# Patient Record
Sex: Male | Born: 1945 | Race: Black or African American | Hispanic: No | Marital: Married | Smoking: Current every day smoker
Health system: Southern US, Community
[De-identification: ages and names within clinical notes are randomized; demographics above are authoritative.]

## PROBLEM LIST (undated history)

## (undated) DIAGNOSIS — I10 Essential (primary) hypertension: Secondary | ICD-10-CM

## (undated) DIAGNOSIS — F4310 Post-traumatic stress disorder, unspecified: Secondary | ICD-10-CM

## (undated) DIAGNOSIS — J449 Chronic obstructive pulmonary disease, unspecified: Secondary | ICD-10-CM

## (undated) DIAGNOSIS — IMO0001 Reserved for inherently not codable concepts without codable children: Secondary | ICD-10-CM

## (undated) DIAGNOSIS — E785 Hyperlipidemia, unspecified: Secondary | ICD-10-CM

## (undated) DIAGNOSIS — G43909 Migraine, unspecified, not intractable, without status migrainosus: Secondary | ICD-10-CM

## (undated) DIAGNOSIS — G4733 Obstructive sleep apnea (adult) (pediatric): Secondary | ICD-10-CM

## (undated) DIAGNOSIS — E119 Type 2 diabetes mellitus without complications: Secondary | ICD-10-CM

## (undated) DIAGNOSIS — Z9989 Dependence on other enabling machines and devices: Secondary | ICD-10-CM

## (undated) DIAGNOSIS — J189 Pneumonia, unspecified organism: Secondary | ICD-10-CM

## (undated) HISTORY — PX: TOTAL KNEE ARTHROPLASTY: SHX125

## (undated) HISTORY — PX: JOINT REPLACEMENT: SHX530

## (undated) HISTORY — PX: EXCISIONAL HEMORRHOIDECTOMY: SHX1541

---

## 2006-08-12 ENCOUNTER — Ambulatory Visit: Payer: Self-pay | Admitting: Unknown Physician Specialty

## 2007-06-20 ENCOUNTER — Encounter: Payer: Self-pay | Admitting: Orthopedic Surgery

## 2007-06-26 ENCOUNTER — Encounter: Payer: Self-pay | Admitting: Orthopedic Surgery

## 2007-07-24 ENCOUNTER — Encounter: Payer: Self-pay | Admitting: Orthopedic Surgery

## 2007-08-24 ENCOUNTER — Encounter: Payer: Self-pay | Admitting: Orthopedic Surgery

## 2013-10-17 ENCOUNTER — Inpatient Hospital Stay (HOSPITAL_COMMUNITY)
Admission: EM | Admit: 2013-10-17 | Discharge: 2013-10-20 | DRG: 190 | Disposition: A | Discharge status: Home / Self Care | Payer: Medicare Other | Attending: Internal Medicine | Admitting: Internal Medicine

## 2013-10-17 ENCOUNTER — Emergency Department (HOSPITAL_COMMUNITY): Payer: Medicare Other

## 2013-10-17 ENCOUNTER — Encounter (HOSPITAL_COMMUNITY): Payer: Self-pay | Admitting: Emergency Medicine

## 2013-10-17 DIAGNOSIS — J441 Chronic obstructive pulmonary disease with (acute) exacerbation: Principal | ICD-10-CM | POA: Diagnosis present

## 2013-10-17 DIAGNOSIS — T380X5A Adverse effect of glucocorticoids and synthetic analogues, initial encounter: Secondary | ICD-10-CM | POA: Diagnosis present

## 2013-10-17 DIAGNOSIS — J189 Pneumonia, unspecified organism: Secondary | ICD-10-CM | POA: Diagnosis present

## 2013-10-17 DIAGNOSIS — E871 Hypo-osmolality and hyponatremia: Secondary | ICD-10-CM | POA: Diagnosis present

## 2013-10-17 DIAGNOSIS — D72829 Elevated white blood cell count, unspecified: Secondary | ICD-10-CM | POA: Diagnosis present

## 2013-10-17 DIAGNOSIS — F411 Generalized anxiety disorder: Secondary | ICD-10-CM | POA: Diagnosis present

## 2013-10-17 DIAGNOSIS — E119 Type 2 diabetes mellitus without complications: Secondary | ICD-10-CM

## 2013-10-17 DIAGNOSIS — E785 Hyperlipidemia, unspecified: Secondary | ICD-10-CM

## 2013-10-17 DIAGNOSIS — Z7982 Long term (current) use of aspirin: Secondary | ICD-10-CM

## 2013-10-17 DIAGNOSIS — I10 Essential (primary) hypertension: Secondary | ICD-10-CM

## 2013-10-17 DIAGNOSIS — F17200 Nicotine dependence, unspecified, uncomplicated: Secondary | ICD-10-CM | POA: Diagnosis present

## 2013-10-17 DIAGNOSIS — Z720 Tobacco use: Secondary | ICD-10-CM

## 2013-10-17 DIAGNOSIS — F4310 Post-traumatic stress disorder, unspecified: Secondary | ICD-10-CM | POA: Diagnosis present

## 2013-10-17 HISTORY — DX: Hyperlipidemia, unspecified: E78.5

## 2013-10-17 HISTORY — DX: Post-traumatic stress disorder, unspecified: F43.10

## 2013-10-17 HISTORY — DX: Pneumonia, unspecified organism: J18.9

## 2013-10-17 HISTORY — DX: Essential (primary) hypertension: I10

## 2013-10-17 HISTORY — DX: Migraine, unspecified, not intractable, without status migrainosus: G43.909

## 2013-10-17 HISTORY — DX: Dependence on other enabling machines and devices: Z99.89

## 2013-10-17 HISTORY — DX: Type 2 diabetes mellitus without complications: E11.9

## 2013-10-17 HISTORY — DX: Obstructive sleep apnea (adult) (pediatric): G47.33

## 2013-10-17 LAB — CBC WITH DIFFERENTIAL/PLATELET
BASOS ABS: 0 10*3/uL (ref 0.0–0.1)
Basophils Relative: 0 % (ref 0–1)
Eosinophils Absolute: 0.1 10*3/uL (ref 0.0–0.7)
Eosinophils Relative: 1 % (ref 0–5)
HCT: 36.4 % — ABNORMAL LOW (ref 39.0–52.0)
Hemoglobin: 13.1 g/dL (ref 13.0–17.0)
Lymphocytes Relative: 33 % (ref 12–46)
Lymphs Abs: 4.7 10*3/uL — ABNORMAL HIGH (ref 0.7–4.0)
MCH: 29.8 pg (ref 26.0–34.0)
MCHC: 36 g/dL (ref 30.0–36.0)
MCV: 82.9 fL (ref 78.0–100.0)
MONO ABS: 1.1 10*3/uL — AB (ref 0.1–1.0)
MONOS PCT: 8 % (ref 3–12)
NEUTROS PCT: 58 % (ref 43–77)
Neutro Abs: 8.4 10*3/uL — ABNORMAL HIGH (ref 1.7–7.7)
PLATELETS: 350 10*3/uL (ref 150–400)
RBC: 4.39 MIL/uL (ref 4.22–5.81)
RDW: 14.8 % (ref 11.5–15.5)
WBC: 14.3 10*3/uL — AB (ref 4.0–10.5)

## 2013-10-17 LAB — CBC
HCT: 34 % — ABNORMAL LOW (ref 39.0–52.0)
Hemoglobin: 12.7 g/dL — ABNORMAL LOW (ref 13.0–17.0)
MCH: 30.9 pg (ref 26.0–34.0)
MCHC: 37.4 g/dL — AB (ref 30.0–36.0)
MCV: 82.7 fL (ref 78.0–100.0)
Platelets: 333 10*3/uL (ref 150–400)
RBC: 4.11 MIL/uL — ABNORMAL LOW (ref 4.22–5.81)
RDW: 15.1 % (ref 11.5–15.5)
WBC: 12.2 10*3/uL — ABNORMAL HIGH (ref 4.0–10.5)

## 2013-10-17 LAB — I-STAT ARTERIAL BLOOD GAS, ED
Acid-Base Excess: 2 mmol/L (ref 0.0–2.0)
BICARBONATE: 26.2 meq/L — AB (ref 20.0–24.0)
O2 Saturation: 91 %
PCO2 ART: 36.6 mmHg (ref 35.0–45.0)
TCO2: 27 mmol/L (ref 0–100)
pH, Arterial: 7.463 — ABNORMAL HIGH (ref 7.350–7.450)
pO2, Arterial: 56 mmHg — ABNORMAL LOW (ref 80.0–100.0)

## 2013-10-17 LAB — URINALYSIS, ROUTINE W REFLEX MICROSCOPIC
Glucose, UA: >1000 mg/dL — AB
Hgb urine dipstick: NEGATIVE
Ketones, ur: NEGATIVE mg/dL
LEUKOCYTES UA: NEGATIVE
NITRITE: NEGATIVE
PH: 6 (ref 5.0–8.0)
Protein, ur: 30 mg/dL — AB
Specific Gravity, Urine: 1.038 — ABNORMAL HIGH (ref 1.005–1.030)
Urobilinogen, UA: 1 mg/dL (ref 0.0–1.0)

## 2013-10-17 LAB — PRO B NATRIURETIC PEPTIDE: PRO B NATRI PEPTIDE: 53.6 pg/mL (ref 0–125)

## 2013-10-17 LAB — GLUCOSE, CAPILLARY
Glucose-Capillary: 394 mg/dL — ABNORMAL HIGH (ref 70–99)
Glucose-Capillary: 392 mg/dL — ABNORMAL HIGH (ref 70–99)

## 2013-10-17 LAB — CREATININE, SERUM
CREATININE: 1.17 mg/dL (ref 0.50–1.35)
GFR calc Af Amer: 72 mL/min — ABNORMAL LOW (ref >90)
GFR, EST NON AFRICAN AMERICAN: 62 mL/min — AB (ref >90)

## 2013-10-17 LAB — I-STAT TROPONIN, ED: TROPONIN I, POC: 0.01 ng/mL (ref 0.00–0.08)

## 2013-10-17 LAB — URINE MICROSCOPIC-ADD ON

## 2013-10-17 LAB — BASIC METABOLIC PANEL
BUN: 30 mg/dL — ABNORMAL HIGH (ref 6–23)
CHLORIDE: 93 meq/L — AB (ref 96–112)
CO2: 24 mEq/L (ref 19–32)
Calcium: 10.6 mg/dL — ABNORMAL HIGH (ref 8.4–10.5)
Creatinine, Ser: 1.21 mg/dL (ref 0.50–1.35)
GFR calc non Af Amer: 60 mL/min — ABNORMAL LOW (ref >90)
GFR, EST AFRICAN AMERICAN: 69 mL/min — AB (ref >90)
Glucose, Bld: 339 mg/dL — ABNORMAL HIGH (ref 70–99)
POTASSIUM: 4 meq/L (ref 3.7–5.3)
Sodium: 134 mEq/L — ABNORMAL LOW (ref 137–147)

## 2013-10-17 LAB — STREP PNEUMONIAE URINARY ANTIGEN: STREP PNEUMO URINARY ANTIGEN: NEGATIVE

## 2013-10-17 MED ORDER — SENNOSIDES-DOCUSATE SODIUM 8.6-50 MG PO TABS
2.0000 | ORAL_TABLET | Freq: Every day | ORAL | Status: DC
  Administered 2013-10-19: 2 via ORAL
  Filled 2013-10-17: qty 2

## 2013-10-17 MED ORDER — GABAPENTIN 300 MG PO CAPS
300.0000 mg | ORAL_CAPSULE | ORAL | Status: DC
  Administered 2013-10-18 – 2013-10-20 (×5): 300 mg via ORAL
  Filled 2013-10-17 (×9): qty 1

## 2013-10-17 MED ORDER — IPRATROPIUM-ALBUTEROL 0.5-2.5 (3) MG/3ML IN SOLN
3.0000 mL | Freq: Four times a day (QID) | RESPIRATORY_TRACT | Status: DC
  Administered 2013-10-18 – 2013-10-19 (×5): 3 mL via RESPIRATORY_TRACT
  Filled 2013-10-17 (×5): qty 3

## 2013-10-17 MED ORDER — NICOTINE 21 MG/24HR TD PT24
21.0000 mg | MEDICATED_PATCH | Freq: Every day | TRANSDERMAL | Status: DC
  Administered 2013-10-17 – 2013-10-20 (×4): 21 mg via TRANSDERMAL
  Filled 2013-10-17 (×4): qty 1

## 2013-10-17 MED ORDER — TEMAZEPAM 15 MG PO CAPS: 30.0000 mg | ORAL_CAPSULE | Freq: Every evening | ORAL | Status: DC | PRN

## 2013-10-17 MED ORDER — FLUNISOLIDE 25 MCG/ACT (0.025%) NA SOLN
2.0000 | Freq: Two times a day (BID) | NASAL | Status: DC | PRN
  Filled 2013-10-17: qty 25

## 2013-10-17 MED ORDER — AMLODIPINE BESYLATE 2.5 MG PO TABS
2.5000 mg | ORAL_TABLET | Freq: Every day | ORAL | Status: DC
  Administered 2013-10-17 – 2013-10-18 (×2): 2.5 mg via ORAL
  Filled 2013-10-17 (×2): qty 1

## 2013-10-17 MED ORDER — INSULIN ASPART 100 UNIT/ML ~~LOC~~ SOLN
0.0000 [IU] | Freq: Three times a day (TID) | SUBCUTANEOUS | Status: DC
  Administered 2013-10-17: 15 [IU] via SUBCUTANEOUS

## 2013-10-17 MED ORDER — ALBUTEROL SULFATE (2.5 MG/3ML) 0.083% IN NEBU
5.0000 mg | INHALATION_SOLUTION | Freq: Once | RESPIRATORY_TRACT | Status: AC
  Administered 2013-10-17: 5 mg via RESPIRATORY_TRACT
  Filled 2013-10-17: qty 6

## 2013-10-17 MED ORDER — GABAPENTIN 300 MG PO CAPS: 300.0000 mg | ORAL_CAPSULE | Freq: Three times a day (TID) | ORAL | Status: DC

## 2013-10-17 MED ORDER — METHYLPREDNISOLONE SODIUM SUCC 125 MG IJ SOLR
60.0000 mg | Freq: Two times a day (BID) | INTRAMUSCULAR | Status: DC
  Administered 2013-10-17 – 2013-10-18 (×2): 60 mg via INTRAVENOUS
  Filled 2013-10-17 (×4): qty 0.96

## 2013-10-17 MED ORDER — PANTOPRAZOLE SODIUM 40 MG PO TBEC
40.0000 mg | DELAYED_RELEASE_TABLET | Freq: Two times a day (BID) | ORAL | Status: DC
  Administered 2013-10-17 – 2013-10-20 (×6): 40 mg via ORAL
  Filled 2013-10-17 (×8): qty 1

## 2013-10-17 MED ORDER — DEXTROSE 5 % IV SOLN
500.0000 mg | Freq: Once | INTRAVENOUS | Status: DC
  Administered 2013-10-17: 500 mg via INTRAVENOUS

## 2013-10-17 MED ORDER — INSULIN GLARGINE 100 UNIT/ML ~~LOC~~ SOLN
18.0000 [IU] | Freq: Every day | SUBCUTANEOUS | Status: DC
  Administered 2013-10-17: 18 [IU] via SUBCUTANEOUS
  Filled 2013-10-17 (×2): qty 0.18

## 2013-10-17 MED ORDER — HYDROCODONE-ACETAMINOPHEN 10-325 MG PO TABS
1.0000 | ORAL_TABLET | Freq: Four times a day (QID) | ORAL | Status: DC | PRN
  Administered 2013-10-18: 1 via ORAL
  Filled 2013-10-17: qty 1

## 2013-10-17 MED ORDER — POTASSIUM CHLORIDE CRYS ER 20 MEQ PO TBCR
20.0000 meq | EXTENDED_RELEASE_TABLET | Freq: Every day | ORAL | Status: DC
  Administered 2013-10-18: 20 meq via ORAL
  Filled 2013-10-17: qty 1

## 2013-10-17 MED ORDER — VITAMIN D3 25 MCG (1000 UNIT) PO TABS
1000.0000 [IU] | ORAL_TABLET | Freq: Every day | ORAL | Status: DC
  Administered 2013-10-18 – 2013-10-20 (×3): 1000 [IU] via ORAL
  Filled 2013-10-17 (×3): qty 1

## 2013-10-17 MED ORDER — BUDESONIDE 0.25 MG/2ML IN SUSP
0.2500 mg | Freq: Two times a day (BID) | RESPIRATORY_TRACT | Status: DC
  Administered 2013-10-17 – 2013-10-20 (×6): 0.25 mg via RESPIRATORY_TRACT
  Filled 2013-10-17 (×8): qty 2

## 2013-10-17 MED ORDER — ASPIRIN EC 81 MG PO TBEC
81.0000 mg | DELAYED_RELEASE_TABLET | Freq: Every day | ORAL | Status: DC
  Administered 2013-10-18 – 2013-10-20 (×3): 81 mg via ORAL
  Filled 2013-10-17 (×3): qty 1

## 2013-10-17 MED ORDER — SODIUM CHLORIDE 0.9 % IV SOLN
INTRAVENOUS | Status: DC
  Administered 2013-10-17 – 2013-10-18 (×2): via INTRAVENOUS

## 2013-10-17 MED ORDER — CHLORTHALIDONE 25 MG PO TABS
12.5000 mg | ORAL_TABLET | Freq: Every day | ORAL | Status: DC
  Administered 2013-10-18 – 2013-10-20 (×3): 12.5 mg via ORAL
  Filled 2013-10-17 (×3): qty 0.5

## 2013-10-17 MED ORDER — VANCOMYCIN HCL IN DEXTROSE 750-5 MG/150ML-% IV SOLN
750.0000 mg | Freq: Two times a day (BID) | INTRAVENOUS | Status: DC
  Administered 2013-10-17 – 2013-10-20 (×6): 750 mg via INTRAVENOUS
  Filled 2013-10-17 (×7): qty 150

## 2013-10-17 MED ORDER — GABAPENTIN 300 MG PO CAPS
900.0000 mg | ORAL_CAPSULE | Freq: Every day | ORAL | Status: DC
  Administered 2013-10-17 – 2013-10-19 (×3): 900 mg via ORAL
  Filled 2013-10-17 (×4): qty 3

## 2013-10-17 MED ORDER — ENOXAPARIN SODIUM 40 MG/0.4ML ~~LOC~~ SOLN
40.0000 mg | SUBCUTANEOUS | Status: DC
  Administered 2013-10-17 – 2013-10-19 (×3): 40 mg via SUBCUTANEOUS
  Filled 2013-10-17 (×4): qty 0.4

## 2013-10-17 MED ORDER — TERAZOSIN HCL 5 MG PO CAPS
10.0000 mg | ORAL_CAPSULE | Freq: Every day | ORAL | Status: DC
  Administered 2013-10-17 – 2013-10-19 (×3): 10 mg via ORAL
  Filled 2013-10-17 (×4): qty 2

## 2013-10-17 MED ORDER — ALBUTEROL SULFATE (2.5 MG/3ML) 0.083% IN NEBU: 2.5000 mg | INHALATION_SOLUTION | RESPIRATORY_TRACT | Status: DC | PRN

## 2013-10-17 MED ORDER — PAROXETINE HCL 20 MG PO TABS
40.0000 mg | ORAL_TABLET | Freq: Every day | ORAL | Status: DC
  Administered 2013-10-18 – 2013-10-20 (×3): 40 mg via ORAL
  Filled 2013-10-17 (×3): qty 2

## 2013-10-17 MED ORDER — DEXTROSE 5 % IV SOLN
1.0000 g | Freq: Once | INTRAVENOUS | Status: AC
  Administered 2013-10-17: 1 g via INTRAVENOUS
  Filled 2013-10-17: qty 10

## 2013-10-17 MED ORDER — INSULIN ASPART 100 UNIT/ML ~~LOC~~ SOLN
0.0000 [IU] | Freq: Every day | SUBCUTANEOUS | Status: DC
  Administered 2013-10-17: 5 [IU] via SUBCUTANEOUS

## 2013-10-17 MED ORDER — DEXTROSE 5 % IV SOLN
1.0000 g | Freq: Three times a day (TID) | INTRAVENOUS | Status: DC
  Administered 2013-10-17 – 2013-10-20 (×9): 1 g via INTRAVENOUS
  Filled 2013-10-17 (×10): qty 1

## 2013-10-17 MED ORDER — PNEUMOCOCCAL VAC POLYVALENT 25 MCG/0.5ML IJ INJ
0.5000 mL | INJECTION | INTRAMUSCULAR | Status: AC
  Administered 2013-10-18: 0.5 mL via INTRAMUSCULAR
  Filled 2013-10-17: qty 0.5

## 2013-10-17 MED ORDER — ATORVASTATIN CALCIUM 80 MG PO TABS
80.0000 mg | ORAL_TABLET | Freq: Every day | ORAL | Status: DC
  Administered 2013-10-17 – 2013-10-19 (×3): 80 mg via ORAL
  Filled 2013-10-17 (×4): qty 1

## 2013-10-17 MED ORDER — IPRATROPIUM-ALBUTEROL 0.5-2.5 (3) MG/3ML IN SOLN
3.0000 mL | RESPIRATORY_TRACT | Status: DC
  Administered 2013-10-17: 3 mL via RESPIRATORY_TRACT
  Filled 2013-10-17: qty 3

## 2013-10-17 NOTE — ED Provider Notes (Signed)
CSN: [id number]     Arrival date & time 10/17/13  1244 History   First MD Initiated Contact with Patient 10/17/13 1328     Chief Complaint  Patient presents with  . Shortness of Breath    The [facility] and was treated, given meds and released. The paitent said he is not getting better so he decided to come here.     (Consider location/radiation/quality/duration/timing/severity/associated sxs/prior Treatment) HPI Comments: This 68 year old male past medical history significant for DM, hypertension, hyperlipidemia, PTSD, tobacco abuse presented to the emergency department from home for worsening productive cough, fever and chills, shortness of breath since Saturday. Patient was [facility] on Thursday for 2 weeks of productive cough and fevers and was diagnosed with bronchopneumonia discharged home with albuterol inhaler, steroid and antibiotic. Patient seemed to be improving until Saturday when he developed worsening symptoms despite taking antibiotics and other medications prescribed at discharge as prescribed. No history of any hospitalizations or intubations for respiratory problems in past.  Patient is a 68 y.o. male presenting with shortness of breath.  Shortness of Breath Associated symptoms: cough and fever   Associated symptoms: no abdominal pain, no headaches and no vomiting     Past Medical History  Diagnosis Date  . PTSD (post-traumatic stress disorder)   . Diabetes mellitus without complication   . Hypertension   . Hyperlipidemia    Past Surgical History  Procedure Laterality Date  . Knee surgery     History reviewed. No pertinent family history. History  Substance Use Topics  . Smoking status: Current Every Day Smoker -- 1.00 packs/day    Types: Cigarettes  . Smokeless tobacco: Never Used  . Alcohol Use: No    Review of Systems  Constitutional: Positive for fever, chills, appetite change and fatigue.  Respiratory: Positive  for cough and shortness of breath.   Gastrointestinal: Positive for nausea. Negative for vomiting, abdominal pain, diarrhea and constipation.  Neurological: Negative for headaches.  All other systems reviewed and are negative.     Allergies  Review of patient's allergies indicates no known allergies.  Home Medications   Prior to Admission medications   Not on File   BP 130/85  Pulse 120  Temp(Src) 97.9 F (36.6 C) (Oral)  Resp 22  SpO2 99% Physical Exam  Nursing note and vitals reviewed. Constitutional: He is oriented to person, place, and time. He appears well-developed and well-nourished. No distress.  HENT:  Head: Normocephalic and atraumatic.  Right Ear: External ear normal.  Left Ear: External ear normal.  Nose: Nose normal.  Eyes: Conjunctivae are normal.  Neck: Normal range of motion. Neck supple.  Cardiovascular: Regular rhythm, normal heart sounds and intact distal pulses.  Tachycardia present.   Pulmonary/Chest: Accessory muscle usage present. No stridor. Not tachypneic and not bradypneic. No respiratory distress. He has decreased breath sounds in the right middle field, the right lower field and the left lower field. He has wheezes in the right upper field and the left upper field. He has rhonchi in the left middle field and the left lower field. He exhibits no tenderness.  Abdominal: Soft. Bowel sounds are normal. There is no tenderness.  Musculoskeletal: He exhibits no edema.  Moves all extremities without ataxia  Neurological: He is alert and oriented to person, place, and time.  Skin: Skin is warm and dry. He is not diaphoretic.    ED Course  Procedures (including critical care time) Medications  azithromycin (  ZITHROMAX) 500 mg in dextrose 5 % 250 mL IVPB (not administered)  albuterol (PROVENTIL) (2.5 MG/3ML) 0.083% nebulizer solution 5 mg (5 mg Nebulization Given 10/17/13 1401)  cefTRIAXone (ROCEPHIN) 1 g in dextrose 5 % 50 mL IVPB (1 g Intravenous New  Bag/Given 10/17/13 1436)    Labs Review Labs Reviewed  BASIC METABOLIC PANEL - Abnormal; Notable for the following:    Sodium 134 (*)    Chloride 93 (*)    Glucose, Bld 339 (*)    BUN 30 (*)    Calcium 10.6 (*)    GFR calc non Af Amer 60 (*)    GFR calc Af Amer 69 (*)    All other components within normal limits  CBC WITH DIFFERENTIAL - Abnormal; Notable for the following:    WBC 14.3 (*)    HCT 36.4 (*)    Neutro Abs 8.4 (*)    Lymphs Abs 4.7 (*)    Monocytes Absolute 1.1 (*)    All other components within normal limits  I-STAT ARTERIAL BLOOD GAS, ED - Abnormal; Notable for the following:    pH, Arterial 7.463 (*)    pO2, Arterial 56.0 (*)    Bicarbonate 26.2 (*)    All other components within normal limits  PRO B NATRIURETIC PEPTIDE  I-STAT TROPOININ, ED    Imaging Review Dg Chest 2 View (if Patient Has Fever And/or Copd)  10/17/2013   CLINICAL DATA:  Difficulty breathing. Weakness. Intermittent chest pain.  EXAM: CHEST  2 VIEW  COMPARISON:  None.  FINDINGS: The heart size is normal. Mild emphysematous changes are evident. Posterior right lower lobe airspace disease is concerning for pneumonia. The left lung is clear. Atherosclerotic changes are noted at the aortic arch. Rightward curvature is present in the thoracic spine. Vertebral body heights are otherwise maintained.  IMPRESSION: 1. Ill-defined right lower lobe airspace disease is concerning for bronchopneumonia. 2. Emphysema. 3. Atherosclerosis. 4. Rightward curvature of the mid thoracic spine.   Electronically Signed   By: Gennette Pac M.D.   On: 10/17/2013 13:44     EKG Interpretation   Date/Time:  Tuesday Oct 17 2013 12:48:02 EDT Ventricular Rate:  120 PR Interval:  134 QRS Duration: 102 QT Interval:  352 QTC Calculation: 497 R Axis:   47 Text Interpretation:  Sinus tachycardia Left ventricular hypertrophy with  repolarization abnormality Cannot rule out Septal infarct , age  undetermined Abnormal ECG No  previous ECGs available Confirmed by Bebe Shaggy   MD, DONALD ([id number]) on 10/17/2013 2:09:30 PM      MDM   Final diagnoses:  Community acquired pneumonia    Filed Vitals:   10/17/13 1247  BP: 130/85  Pulse: 120  Temp: 97.9 F (36.6 C)  Resp: 22    Patient's pulmonary examination improved after nebulizer treatment, he appears more comfortable.   The patient is a 68 year old male presenting to the emergency department for worsening shortness of breath, cough and generalized weakness after being diagnosed with a bronchopneumonia on Thursday at the [place]. He presents with accessory muscle use, decreased breath sounds with upper lung wheezing and tachycardia. Chest x-ray confirms the patient is a continued bronchopneumonia. Azithromycin and Rocephin IV will be given IV bolus. Labs reviewed, leukocytosis noted. Troponin negative. EKG reviewed with sinus tachycardia. Patient will be admitted for failed outpatient community acquired pneumonia treatment and further symptom management. Patient d/w with Dr. Bebe Shaggy, agrees with plan.    Jeannetta Ellis, PA-C 10/17/13 [phone]

## 2013-10-17 NOTE — ED Provider Notes (Signed)
Patient seen/examined in the Emergency Department in conjunction with Midlevel Provider  Patient reports cough and SOB Exam : awake/alert, crackles in right base, he is tachycardic Plan: will admit for pneumonia   EKG Interpretation  Date/Time:  Tuesday Oct 17 2013 12:48:02 EDT Ventricular Rate:  120 PR Interval:  134 QRS Duration: 102 QT Interval:  352 QTC Calculation: 497 R Axis:   47 Text Interpretation:  Sinus tachycardia Left ventricular hypertrophy with repolarization abnormality Cannot rule out Septal infarct , age undetermined Abnormal ECG No previous ECGs available Confirmed by Bebe Shaggy  MD, Genette Huertas ([id number]) on 10/17/2013 2:09:30 PM         Joya Gaskins, MD 10/17/13 1418

## 2013-10-17 NOTE — Progress Notes (Signed)
Pt placed on CPAP at this time. Pt stated he was unsure of his pressure at home so he was placed on Auto with max pressure of 20 and minimum pressure of 4cm H2O. RT will continue to monitor.

## 2013-10-17 NOTE — ED Notes (Signed)
According to his wife, she took him to the [facility] hospital and he was diagnosed Bronchial Pneumonia.  He was given an antibiotic and a nasal spray and sent home.  The patient comes in with SOB today and says he is weak and cannot even walk.  He has fallen several times due to weakness.  The patient came in to be evaluated.  The patient does need a cane to walk.

## 2013-10-17 NOTE — Progress Notes (Signed)
[name] Parks [id number] Code Status: Full Admission Data: 10/17/2013 4:59 PM Attending Provider:  Isidoro Donning PCP:No primary provider on file. Consults/ Treatment Team:  [facility]   Huckston Laino is a 68 y.o. male patient admitted from ED awake, alert - oriented  X 3 - no acute distress noted.  VSS - Blood pressure 119/77, pulse 109, temperature 97.6 F (36.4 C), temperature source Oral, resp. rate 16, height 5\' 11"  (1.803 m), weight 88.451 kg (195 lb), SpO2 95.00%.    IV in place, occlusive dsg intact without redness.  Orientation to room, and floor completed with information packet given to patient/family.  Patient declined safety video at this time.  Admission INP armband ID verified with patient/family, and in place.   SR up x 2, fall assessment complete, with patient and family able to verbalize understanding of risk associated with falls, and verbalized understanding to call nsg before up out of bed.  Call light within reach, patient able to voice, and demonstrate understanding.  Skin, clean-dry- intact without evidence of bruising, or skin tears.   No evidence of skin break down noted on exam.  Will cont to eval and treat per MD orders.  Aldean Ast, RN 10/17/2013 4:59 PM

## 2013-10-17 NOTE — Progress Notes (Signed)
Report received from ED RN for admission to [phone]

## 2013-10-17 NOTE — Progress Notes (Signed)
ANTIBIOTIC CONSULT NOTE - INITIAL  Pharmacy Consult for vancomycin Indication: pneumonia  No Known Allergies  Patient Measurements: Height: 5\' 11"  (180.3 cm) Weight: 195 lb (88.451 kg) IBW/kg (Calculated) : 75.3 Adjusted Body Weight:   Vital Signs: Temp: 97.6 F (36.4 C) (05/26 1628) Temp src: Oral (05/26 1628) BP: 119/77 mmHg (05/26 1628) Pulse Rate: 109 (05/26 1628) Intake/Output from previous day:   Intake/Output from this shift:    Labs:  Recent Labs  10/17/13 1407  WBC 14.3*  HGB 13.1  PLT 350  CREATININE 1.21   Estimated Creatinine Clearance: 62.2 ml/min (by C-G formula based on Cr of 1.21). No results found for this basename: VANCOTROUGH, VANCOPEAK, VANCORANDOM, GENTTROUGH, GENTPEAK, GENTRANDOM, TOBRATROUGH, TOBRAPEAK, TOBRARND, AMIKACINPEAK, AMIKACINTROU, AMIKACIN,  in the last 72 hours   Microbiology: No results found for this or any previous visit (from the past 720 hour(s)).  Medical History: Past Medical History  Diagnosis Date  . PTSD (post-traumatic stress disorder)   . Diabetes mellitus without complication   . Hypertension   . Hyperlipidemia     Medications:  Prescriptions prior to admission  Medication Sig Dispense Refill  . amLODipine (NORVASC) 5 MG tablet Take 2.5 mg by mouth daily.      Marland Kitchen aspirin EC 81 MG tablet Take 81 mg by mouth daily.      Marland Kitchen atorvastatin (LIPITOR) 80 MG tablet Take 80 mg by mouth at bedtime.      . chlorthalidone (HYGROTON) 25 MG tablet Take 12.5 mg by mouth daily.      . cholecalciferol (VITAMIN D) 1000 UNITS tablet Take 1,000 Units by mouth daily.      . flunisolide (NASALIDE) 25 MCG/ACT (0.025%) SOLN Place 2 sprays into the nose 2 (two) times daily as needed (for sinuses).      . gabapentin (NEURONTIN) 300 MG capsule Take 300-900 mg by mouth 3 (three) times daily. Take 300 mg every morning and at 2 pm and take 900 mg at bedtime      . glipiZIDE (GLUCOTROL) 5 MG tablet Take by mouth 2 (two) times daily.      Marland Kitchen  HYDROcodone-acetaminophen (NORCO) 10-325 MG per tablet Take 1 tablet by mouth 4 (four) times daily as needed (for pain).      . insulin glargine (LANTUS) 100 UNIT/ML injection Inject 18 Units into the skin at bedtime.      . metFORMIN (GLUCOPHAGE) 500 MG tablet Take 500 mg by mouth 3 (three) times daily.      . naproxen (NAPROSYN) 375 MG tablet Take 375 mg by mouth 2 (two) times daily as needed (for knee pain).      Marland Kitchen omeprazole (PRILOSEC) 20 MG capsule Take 20 mg by mouth 2 (two) times daily.      Marland Kitchen PARoxetine (PAXIL) 40 MG tablet Take 40 mg by mouth daily.      . potassium chloride SA (K-DUR,KLOR-CON) 20 MEQ tablet Take 20 mEq by mouth daily.      Marland Kitchen senna-docusate (SENOKOT-S) 8.6-50 MG per tablet Take 2 tablets by mouth at bedtime.      . temazepam (RESTORIL) 30 MG capsule Take 30 mg by mouth at bedtime as needed for sleep.      Marland Kitchen terazosin (HYTRIN) 10 MG capsule Take 10 mg by mouth at bedtime.       Assessment: Pt presents with SOB, productive cough x2 weeks.  He was admitted to outside hospital last week and discharge home on po antibiotics.  For the past 3 days, symptoms  have worsened.  Afebrile, white count 14.   Goal of Therapy:  Vancomycin trough level 15-20 mcg/ml  Plan: -Vancomycin 750 mg IV q12h -VT as indicated -Monitor renal fx, fever curve, clinical progression -F/u cultures -Continue cefepime     Agapito GamesAlison Keanu Frickey, PharmD, BCPS Clinical Pharmacist Pager: [phone] 10/17/2013 4:56 PM

## 2013-10-17 NOTE — H&P (Signed)
History and Physical       Hospital Admission Note Date: 10/17/2013  Patient name: [name] Parks Medical record number: [id number] Date of birth: 03-24-1946 Age: 68 y.o. Gender: male  [facility] [place]    Chief Complaint:  Coughing with shortness of breath, productive phlegm for the last 2 weeks  HPI: Patient is a 68 year old male with past medical history of diabetes mellitus, hypertension, hyperlipidemia, tobacco abuse, presented to the ER with above symptoms. History was obtained from the patient who reported that he has been having subjective fevers, chills and productive cough with yellowish sputum for last 2 weeks. He went to [facility] on Thursday, 5/21, he was diagnosed with pneumonia and was discharged home with albuterol inhaler, prednisone and antibiotic which he does not remember patient. Patient reported that initially he felt to be improving however for last 3 days his symptoms have worsened despite taking the medications. His wife has similar cough.   Review of Systems:  Constitutional: + fever, chills, diaphoresis, poor appetite and fatigue.  HEENT: Denies photophobia, eye pain, redness, hearing loss, ear pain,  sore throat, rhinorrhea, sneezing, mouth sores, trouble swallowing, neck pain, neck stiffness and tinnitus. + Chest congestion   Respiratory: please see history of present illness  Cardiovascular: Denies chest pain, palpitations and leg swelling.  Gastrointestinal: Denies nausea, vomiting, abdominal pain, diarrhea, constipation, blood in stool and abdominal distention.  Genitourinary: Denies dysuria, urgency, frequency, hematuria, flank pain and difficulty urinating.  Musculoskeletal: Denies myalgias, back pain, joint swelling, arthralgias and gait problem.  Skin: Denies pallor, rash and wound.  Neurological: Denies dizziness, seizures, syncope, weakness, light-headedness, numbness and headaches.   Hematological: Denies adenopathy. Easy bruising, personal or family bleeding history  Psychiatric/Behavioral: Denies suicidal ideation, mood changes, confusion, nervousness, sleep disturbance and agitation  Past Medical History: Past Medical History  Diagnosis Date  . PTSD (post-traumatic stress disorder)   . Diabetes mellitus without complication   . Hypertension   . Hyperlipidemia    Past Surgical History  Procedure Laterality Date  . Knee surgery      Medications: Prior to Admission medications   Medication Sig Start Date End Date Taking? Authorizing Provider  amLODipine (NORVASC) 5 MG tablet Take 2.5 mg by mouth daily.   Yes Historical Provider, MD  aspirin EC 81 MG tablet Take 81 mg by mouth daily.   Yes Historical Provider, MD  atorvastatin (LIPITOR) 80 MG tablet Take 80 mg by mouth at bedtime.   Yes Historical Provider, MD  chlorthalidone (HYGROTON) 25 MG tablet Take 12.5 mg by mouth daily.   Yes Historical Provider, MD  cholecalciferol (VITAMIN D) 1000 UNITS tablet Take 1,000 Units by mouth daily.   Yes Historical Provider, MD  flunisolide (NASALIDE) 25 MCG/ACT (0.025%) SOLN Place 2 sprays into the nose 2 (two) times daily as needed (for sinuses).   Yes Historical Provider, MD  gabapentin (NEURONTIN) 300 MG capsule Take 300-900 mg by mouth 3 (three) times daily. Take 300 mg every morning and at 2 pm and take 900 mg at bedtime   Yes Historical Provider, MD  glipiZIDE (GLUCOTROL) 5 MG tablet Take by mouth 2 (two) times daily.   Yes Historical Provider, MD  HYDROcodone-acetaminophen (NORCO) 10-325 MG per tablet Take 1 tablet by mouth 4 (four) times daily as needed (for pain).   Yes Historical Provider, MD  insulin glargine (LANTUS) 100 UNIT/ML injection Inject 18 Units into the skin at bedtime.   Yes Historical Provider, MD  metFORMIN (GLUCOPHAGE) 500 MG tablet  Take 500 mg by mouth 3 (three) times daily.   Yes Historical Provider, MD  naproxen (NAPROSYN) 375 MG tablet Take 375 mg  by mouth 2 (two) times daily as needed (for knee pain).   Yes Historical Provider, MD  omeprazole (PRILOSEC) 20 MG capsule Take 20 mg by mouth 2 (two) times daily.   Yes Historical Provider, MD  PARoxetine (PAXIL) 40 MG tablet Take 40 mg by mouth daily.   Yes Historical Provider, MD  potassium chloride SA (K-DUR,KLOR-CON) 20 MEQ tablet Take 20 mEq by mouth daily.   Yes Historical Provider, MD  senna-docusate (SENOKOT-S) 8.6-50 MG per tablet Take 2 tablets by mouth at bedtime.   Yes Historical Provider, MD  temazepam (RESTORIL) 30 MG capsule Take 30 mg by mouth at bedtime as needed for sleep.   Yes Historical Provider, MD  terazosin (HYTRIN) 10 MG capsule Take 10 mg by mouth at bedtime.   Yes Historical Provider, MD    Allergies:  No Known Allergies  Social History:  reports that he has been smoking Cigarettes.  He has been smoking about 1.00 pack per day. He has never used smokeless tobacco. He reports that he does not drink alcohol or use illicit drugs.  Family History: History reviewed. No pertinent family history.  Physical Exam: Blood pressure 130/85, pulse 120, temperature 97.9 F (36.6 C), temperature source Oral, resp. rate 22, SpO2 99.00%. General: Alert, awake, oriented x3, in no acute distress. HEENT: normocephalic, atraumatic, anicteric sclera, pink conjunctiva, pupils equal and reactive to light and accomodation, oropharynx clear Neck: supple, no masses or lymphadenopathy, no goiter, no bruits  Heart:  sinus tachycardia, Regular rate and rhythm, without murmurs, rubs or gallops. Lungs: bilateral rhonchi with wheezing  Abdomen: Soft, nontender, nondistended, positive bowel sounds, no masses. Extremities: No clubbing, cyanosis or edema with positive pedal pulses. Neuro: Grossly intact, no focal neurological deficits, strength 5/5 upper and lower extremities bilaterally Psych: alert and oriented x 3, normal mood and affect Skin: no rashes or lesions, warm and dry   LABS on  Admission:  Basic Metabolic Panel:  Recent Labs Lab 10/17/13 1407  NA 134*  K 4.0  CL 93*  CO2 24  GLUCOSE 339*  BUN 30*  CREATININE 1.21  CALCIUM 10.6*   Liver Function Tests: No results found for this basename: AST, ALT, ALKPHOS, BILITOT, PROT, ALBUMIN,  in the last 168 hours No results found for this basename: LIPASE, AMYLASE,  in the last 168 hours No results found for this basename: AMMONIA,  in the last 168 hours CBC:  Recent Labs Lab 10/17/13 1407  WBC 14.3*  NEUTROABS 8.4*  HGB 13.1  HCT 36.4*  MCV 82.9  PLT 350   Cardiac Enzymes: No results found for this basename: CKTOTAL, CKMB, CKMBINDEX, TROPONINI,  in the last 168 hours BNP: No components found with this basename: POCBNP,  CBG: No results found for this basename: GLUCAP,  in the last 168 hours   Radiological Exams on Admission: Dg Chest 2 View (if Patient Has Fever And/or Copd)  10/17/2013   CLINICAL DATA:  Difficulty breathing. Weakness. Intermittent chest pain.  EXAM: CHEST  2 VIEW  COMPARISON:  None.  FINDINGS: The heart size is normal. Mild emphysematous changes are evident. Posterior right lower lobe airspace disease is concerning for pneumonia. The left lung is clear. Atherosclerotic changes are noted at the aortic arch. Rightward curvature is present in the thoracic spine. Vertebral body heights are otherwise maintained.  IMPRESSION: 1. Ill-defined right lower lobe  airspace disease is concerning for bronchopneumonia. 2. Emphysema. 3. Atherosclerosis. 4. Rightward curvature of the mid thoracic spine.   Electronically Signed   By: Gennette Pac M.D.   On: 10/17/2013 13:44    Assessment/Plan Principal Problem:   HCAP (healthcare-associated pneumonia) and COPD exacerbation: Failure to outpatient therapy -  Place on IV vancomycin and cefepime, obtain urine legionella antigen, urine strep in addition, sputum culture - Leukocytosis could be attributed due to recent steroids, however could be worse from  the pneumonia  - continue DuoNeb scheduled and as needed albuterol, Pulmicort, IV Solu-Medrol -  place on nicotine patch   Active Problems:   COPD exacerbation: As #1     Nicotine abuse -Patient was counseled strongly on quitting smoking, placed on nicotine patch      Diabetes mellitus - Obtain hemoglobin A1c, continue Lantus and sliding scale insulin     Hypertension - Restart outpatient home medications including amlodipine, chlorthalidone     Hyperlipidemia - Continue statin   Anxiety, PTSD: Continue Paxil  DVT prophylaxis:  LOVENOX  CODE STATUS:  full code   Family Communication: Admission, patients condition and plan of care including tests being ordered have been discussed with the patient, Wife and son who indicates understanding and agree with the plan and Code Status   Further plan will depend as patient's clinical course evolves and further radiologic and laboratory data become available.   Time Spent on Admission:  1 hour   Florena Kozma Jenna Luo M.D. [facility] 10/17/2013, 3:47 PM Pager: [phone]  If 7PM-7AM, please contact night-coverage [email or web address] Password [email or web address]  **Disclaimer: This note was dictated with voice recognition software. Similar sounding words can inadvertently be transcribed and this note may contain transcription errors which may not have been corrected upon publication of note.**

## 2013-10-18 LAB — GLUCOSE, CAPILLARY
GLUCOSE-CAPILLARY: 412 mg/dL — AB (ref 70–99)
GLUCOSE-CAPILLARY: 313 mg/dL — AB (ref 70–99)
GLUCOSE-CAPILLARY: 220 mg/dL — AB (ref 70–99)
GLUCOSE-CAPILLARY: 165 mg/dL — AB (ref 70–99)
Glucose-Capillary: 326 mg/dL — ABNORMAL HIGH (ref 70–99)

## 2013-10-18 LAB — BASIC METABOLIC PANEL
BUN: 29 mg/dL — AB (ref 6–23)
CALCIUM: 9.7 mg/dL (ref 8.4–10.5)
CO2: 22 mEq/L (ref 19–32)
Chloride: 92 mEq/L — ABNORMAL LOW (ref 96–112)
Creatinine, Ser: 1.11 mg/dL (ref 0.50–1.35)
GFR, EST AFRICAN AMERICAN: 77 mL/min — AB (ref >90)
GFR, EST NON AFRICAN AMERICAN: 66 mL/min — AB (ref >90)
Glucose, Bld: 414 mg/dL — ABNORMAL HIGH (ref 70–99)
Potassium: 4.2 mEq/L (ref 3.7–5.3)
Sodium: 131 mEq/L — ABNORMAL LOW (ref 137–147)

## 2013-10-18 LAB — CBC
HCT: 33.3 % — ABNORMAL LOW (ref 39.0–52.0)
Hemoglobin: 12.1 g/dL — ABNORMAL LOW (ref 13.0–17.0)
MCH: 30.8 pg (ref 26.0–34.0)
MCHC: 36.3 g/dL — AB (ref 30.0–36.0)
MCV: 84.7 fL (ref 78.0–100.0)
PLATELETS: 335 10*3/uL (ref 150–400)
RBC: 3.93 MIL/uL — ABNORMAL LOW (ref 4.22–5.81)
RDW: 15 % (ref 11.5–15.5)
WBC: 12.5 10*3/uL — ABNORMAL HIGH (ref 4.0–10.5)

## 2013-10-18 LAB — OSMOLALITY, URINE: OSMOLALITY UR: 675 mosm/kg (ref 390–1090)

## 2013-10-18 LAB — CREATININE, URINE, RANDOM: Creatinine, Urine: 68.25 mg/dL

## 2013-10-18 LAB — LEGIONELLA ANTIGEN, URINE: Legionella Antigen, Urine: NEGATIVE

## 2013-10-18 LAB — URINE CULTURE
CULTURE: NO GROWTH
Colony Count: NO GROWTH

## 2013-10-18 LAB — OSMOLALITY: OSMOLALITY: 293 mosm/kg (ref 275–300)

## 2013-10-18 LAB — HIV ANTIBODY (ROUTINE TESTING W REFLEX): HIV 1&2 Ab, 4th Generation: NONREACTIVE

## 2013-10-18 MED ORDER — INSULIN GLARGINE 100 UNIT/ML ~~LOC~~ SOLN
25.0000 [IU] | Freq: Every day | SUBCUTANEOUS | Status: DC
  Filled 2013-10-18: qty 0.25

## 2013-10-18 MED ORDER — METOPROLOL TARTRATE 50 MG PO TABS
50.0000 mg | ORAL_TABLET | Freq: Two times a day (BID) | ORAL | Status: DC
  Administered 2013-10-18 – 2013-10-20 (×4): 50 mg via ORAL
  Filled 2013-10-18 (×6): qty 1

## 2013-10-18 MED ORDER — INSULIN GLARGINE 100 UNIT/ML ~~LOC~~ SOLN
20.0000 [IU] | Freq: Every day | SUBCUTANEOUS | Status: DC
  Administered 2013-10-18: 20 [IU] via SUBCUTANEOUS
  Filled 2013-10-18 (×2): qty 0.2

## 2013-10-18 MED ORDER — INSULIN ASPART 100 UNIT/ML ~~LOC~~ SOLN
0.0000 [IU] | Freq: Three times a day (TID) | SUBCUTANEOUS | Status: DC
Start: 2013-10-18 — End: 2013-10-20
  Administered 2013-10-18: 7 [IU] via SUBCUTANEOUS
  Administered 2013-10-18: 20 [IU] via SUBCUTANEOUS
  Administered 2013-10-18: 15 [IU] via SUBCUTANEOUS
  Administered 2013-10-19 – 2013-10-20 (×3): 11 [IU] via SUBCUTANEOUS
  Administered 2013-10-20: 4 [IU] via SUBCUTANEOUS

## 2013-10-18 MED ORDER — INSULIN GLARGINE 100 UNIT/ML ~~LOC~~ SOLN
20.0000 [IU] | Freq: Every day | SUBCUTANEOUS | Status: DC
  Filled 2013-10-18: qty 0.2

## 2013-10-18 MED ORDER — INSULIN ASPART 100 UNIT/ML ~~LOC~~ SOLN
6.0000 [IU] | Freq: Once | SUBCUTANEOUS | Status: AC
  Administered 2013-10-18: 6 [IU] via SUBCUTANEOUS

## 2013-10-18 MED ORDER — GLIPIZIDE 5 MG PO TABS
5.0000 mg | ORAL_TABLET | Freq: Two times a day (BID) | ORAL | Status: DC
  Administered 2013-10-18 – 2013-10-20 (×4): 5 mg via ORAL
  Filled 2013-10-18 (×6): qty 1

## 2013-10-18 MED ORDER — INSULIN ASPART 100 UNIT/ML ~~LOC~~ SOLN
15.0000 [IU] | Freq: Once | SUBCUTANEOUS | Status: AC
  Administered 2013-10-18: 15 [IU] via SUBCUTANEOUS

## 2013-10-18 NOTE — Progress Notes (Signed)
  I have directly reviewed the clinical findings, lab, imaging studies and management of this patient in detail. I have interviewed and examined the patient and agree with the documentation,  as recorded by the Physician extender.  Leroy Sea M.D on 10/18/2013 at 2:55 PM  [facility] Office  [phone]

## 2013-10-18 NOTE — Progress Notes (Signed)
Pt ambulated in hallway on room air with O2 sat 100%.

## 2013-10-18 NOTE — ED Provider Notes (Signed)
Medical screening examination/treatment/procedure(s) were conducted as a shared visit with non-physician practitioner(s) and myself.  I personally evaluated the patient during the encounter.   EKG Interpretation   Date/Time:  Tuesday Oct 17 2013 12:48:02 EDT Ventricular Rate:  120 PR Interval:  134 QRS Duration: 102 QT Interval:  352 QTC Calculation: 497 R Axis:   47 Text Interpretation:  Sinus tachycardia Left ventricular hypertrophy with  repolarization abnormality Cannot rule out Septal infarct , age  undetermined Abnormal ECG No previous ECGs available Confirmed by Bebe Shaggy   MD, Erik Nessel ([id number]) on 10/17/2013 2:09:30 PM         Joya Gaskins, MD 10/18/13 [phone]

## 2013-10-18 NOTE — Progress Notes (Signed)
CRITICAL VALUE ALERT  Critical value received:  cbg 412  Date of notification:  10/18/13  Time of notification:  1212  Critical value read back:yes  Nurse who received alert:  Stann Ore  MD notified (1st page):  Dr. Thedore Mins  Time of first page:  1214  MD notified (2nd page):  Time of second page:  Responding MD:  Algis Downs, NP  Time MD responded:  1215   Told to give 20 units novolog with lunch.

## 2013-10-18 NOTE — Progress Notes (Signed)
Utilization review completed.  

## 2013-10-18 NOTE — Progress Notes (Signed)
PROGRESS NOTE  [name] Parks ZOX:[id number] DOB: 10-07-1945 DOA: 10/17/2013 PCP: No primary provider on file. [employer].  Patient is a 69 year old male with past medical history of diabetes mellitus, hypertension, hyperlipidemia, tobacco abuse, presented to the ER with above symptoms. History was obtained from the patient who reported that he has been having subjective fevers, chills and productive cough with yellowish sputum for last 2 weeks. He went to [facility] on Thursday, 5/21, he was diagnosed with pneumonia and was discharged home with albuterol inhaler, prednisone and antibiotic which he does not remember patient. Patient reported that initially he felt to be improving however for last 3 days his symptoms have worsened despite taking the medications. His wife has similar cough.    Assessment/Plan:  Community Acquired Pneumonia Failed outpatient treatment (Azith 250 mg and Prednisone 20 mg starting 5/21) HIV non reactive.  Urine Legionella and strep pneumo are negative. Blood cultures pending. Improving on IV Vanc and Cefepime. Supportive care with Ne[facility].  He was started on IV solumedrol on admission, but this has been discontinued. Patient ambulated on room air with oxygen sats at 100 %  Tobacco abuse On nicotine patch. Has tried to quit in the past.  Counseled on need to quit.  DM CBGs difficult to control in the hospital likely exacerbated by steroids. On lantus and novolog.  Glipizide added back. Continue to monitor.  Steroids have been discontinued which may help.  Hypertension Continue amlodipine and chlorthalidone  Hyperlipidemia  Continue statin.  PTSD:   Continue paxil.  Swallowing difficulty Chronic  Speech eval.  Hyponatremia. Mild 131. Presumably due to pneumonia. Will monitor.  IVF are saline locked.   DVT Prophylaxis:  lovenox  Code Status: full Family Communication: wife at bedside. Disposition Plan:  inpatient.   Antibiotics: Anti-infectives   Start     Dose/Rate Route Frequency Ordered Stop   10/17/13 1800  ceFEPIme (MAXIPIME) 1 g in dextrose 5 % 50 mL IVPB     1 g 100 mL/hr over 30 Minutes Intravenous Every 8 hours 10/17/13 1630 10/25/13 1759   10/17/13 1730  vancomycin (VANCOCIN) IVPB 750 mg/150 ml premix     750 mg 150 mL/hr over 60 Minutes Intravenous Every 12 hours 10/17/13 1654     10/17/13 1415  azithromycin (ZITHROMAX) 500 mg in dextrose 5 % 250 mL IVPB  Status:  Discontinued     500 mg 250 mL/hr over 60 Minutes Intravenous  Once 10/17/13 1409 10/17/13 1630   10/17/13 1415  cefTRIAXone (ROCEPHIN) 1 g in dextrose 5 % 50 mL IVPB     1 g 100 mL/hr over 30 Minutes Intravenous  Once 10/17/13 1409 10/17/13 1607        HPI/Subjective: Patient is a 68 year old male with past medical history of diabetes mellitus, hypertension, hyperlipidemia, tobacco abuse, presented to the ER with above symptoms. History was obtained from the patient who reported that he has been having subjective fevers, chills and productive cough with yellowish sputum for last 2 weeks. He went to [facility] on Thursday, 5/21, he was diagnosed with pneumonia and was discharged home with albuterol inhaler, prednisone and antibiotic which he does not remember patient. Patient reported that initially he felt to be improving however for last 3 days his symptoms have worsened despite taking the medications. His wife has similar cough.  5/27.  Feeling better, breathing better, coughing up sputum.  Objective: Filed Vitals:   10/17/13 2039 10/17/13 2152 10/18/13 0407 10/18/13 1344  BP: 111/72  133/72 134/75  Pulse: 106 78 91 105  Temp: 98.4 F (36.9 C)  98.3 F (36.8 C) 97.7 F (36.5 C)  TempSrc: Oral  Oral Oral  Resp: 16 14 16 16   Height:      Weight:      SpO2: 97% 96% 100% 98%    Intake/Output Summary (Last 24 hours) at 10/18/13 1450 Last data filed at 10/18/13 1345  Gross per 24 hour  Intake    357 ml   Output   1575 ml  Net  -1218 ml   Filed Weights   10/17/13 1628  Weight: 88.451 kg (195 lb)    Exam: General: Well developed, well nourished, NAD, appears stated age  HEENT:   Anicteic Sclera, MMM. No pharyngeal erythema or exudates  Neck: Supple, no JVD, no masses  Cardiovascular: RRR, S1 S2 auscultated, no rubs, murmurs or gallops.   Respiratory: wheeze in the posterior right.  No increased work of breathing.  Decreased breath sounds. Abdomen: obese, Soft, nontender, nondistended, + bowel sounds  Extremities: warm dry without cyanosis clubbing or edema.  Neuro: AAOx3, cranial nerves grossly intact. Strength 5/5 in upper and lower extremities  Skin: Without rashes exudates or nodules.   Psych: Normal affect and demeanor with intact judgement and insight       Data Reviewed: Basic Metabolic Panel:  Recent Labs Lab 10/17/13 1407 10/17/13 1918 10/18/13 1025  NA 134*  --  131*  K 4.0  --  4.2  CL 93*  --  92*  CO2 24  --  22  GLUCOSE 339*  --  414*  BUN 30*  --  29*  CREATININE 1.21 1.17 1.11  CALCIUM 10.6*  --  9.7   CBC:  Recent Labs Lab 10/17/13 1407 10/17/13 1918 10/18/13 1025  WBC 14.3* 12.2* 12.5*  NEUTROABS 8.4*  --   --   HGB 13.1 12.7* 12.1*  HCT 36.4* 34.0* 33.3*  MCV 82.9 82.7 84.7  PLT 350 333 335   BNP (last 3 results)  Recent Labs  10/17/13 1407  PROBNP 53.6   CBG:  Recent Labs Lab 10/17/13 1726 10/17/13 2103 10/18/13 0809 10/18/13 1208  GLUCAP 394* 392* 326* 412*      Studies: Dg Chest 2 View (if Patient Has Fever And/or Copd)  10/17/2013   CLINICAL DATA:  Difficulty breathing. Weakness. Intermittent chest pain.  EXAM: CHEST  2 VIEW  COMPARISON:  None.  FINDINGS: The heart size is normal. Mild emphysematous changes are evident. Posterior right lower lobe airspace disease is concerning for pneumonia. The left lung is clear. Atherosclerotic changes are noted at the aortic arch. Rightward curvature is present in the thoracic  spine. Vertebral body heights are otherwise maintained.  IMPRESSION: 1. Ill-defined right lower lobe airspace disease is concerning for bronchopneumonia. 2. Emphysema. 3. Atherosclerosis. 4. Rightward curvature of the mid thoracic spine.   Electronically Signed   By: Gennette Pac M.D.   On: 10/17/2013 13:44    Scheduled Meds: . amLODipine  2.5 mg Oral Daily  . aspirin EC  81 mg Oral Daily  . atorvastatin  80 mg Oral q1800  . budesonide  0.25 mg Nebulization BID  . ceFEPime (MAXIPIME) IV  1 g Intravenous Q8H  . chlorthalidone  12.5 mg Oral Daily  . cholecalciferol  1,000 Units Oral Daily  . enoxaparin (LOVENOX) injection  40 mg Subcutaneous Q24H  . gabapentin  300 mg Oral 2 times per day  . gabapentin  900 mg Oral QHS  .  glipiZIDE  5 mg Oral BID WC  . insulin aspart  0-20 Units Subcutaneous TID WC  . insulin aspart  0-5 Units Subcutaneous QHS  . insulin glargine  20 Units Subcutaneous QHS  . ipratropium-albuterol  3 mL Nebulization QID  . nicotine  21 mg Transdermal Daily  . pantoprazole  40 mg Oral BID AC  . PARoxetine  40 mg Oral Daily  . senna-docusate  2 tablet Oral QHS  . terazosin  10 mg Oral QHS  . vancomycin  750 mg Intravenous Q12H   Continuous Infusions:   Principal Problem:   HCAP (healthcare-associated pneumonia) Active Problems:   COPD exacerbation   Leukocytosis   Nicotine abuse   Diabetes mellitus   Hypertension   Hyperlipidemia    Stephani PoliceMarianne L York, PA-C  [facility] Pager [phone]. If 7PM-7AM, please contact night-coverage at [email or web address], password [email or web address] 10/18/2013, 2:50 PM  LOS: 1 day

## 2013-10-18 NOTE — Progress Notes (Signed)
Pt stated that he does not want to wear the cpap tonight, encouraged him to try, but pt still declines. Will continue to monitor.

## 2013-10-18 NOTE — Progress Notes (Signed)
PT Cancellation Note  Patient Details Name: [name] Parks MRN: [id number] DOB: 1946/04/23   Cancelled Treatment:    Reason Eval/Treat Not Completed: Patient at procedure or test/unavailable (attempted x2 today, pt with SLP this am, now with RTT) Will follow up as time permits for evaluation.   Fabio Asa 10/18/2013, 4:40 PM Charlotte Crumb, PT DPT  [phone]

## 2013-10-18 NOTE — Progress Notes (Signed)
Inpatient Diabetes Program Recommendations  AACE/ADA: New Consensus Statement on Inpatient Glycemic Control (2013)  Target Ranges:  Prepandial:   less than 140 mg/dL      Peak postprandial:   less than 180 mg/dL (1-2 hours)      Critically ill patients:  140 - 180 mg/dL   Results for DALONTA, BIERNACKI (MRN [id number]) as of 10/18/2013 09:59  Ref. Range 10/17/2013 17:26 10/17/2013 21:03 10/18/2013 08:09  Glucose-Capillary Latest Range: 70-99 mg/dL 179 (H) 150 (H) 569 (H)   Diabetes history: DM2 Outpatient Diabetes medications: Lantus 18 units QHS, Metformin 500 mg TID, Glipizide BID (no dose noted on home med list) Current orders for Inpatient glycemic control: Lantus 20 units QHS, Novolog 0-20 units AC, Novolog 0-5 units HS  Inpatient Diabetes Program Recommendations Insulin - Basal: Please increase Lantus to 25 units QHS (based on 88 kg x 0.3 units). Insulin - Meal Coverage: Please consider ordering Novolog 6 units TID with meals for meal coverage (in addition to Novolog correction scale). HgbA1C: A1C has been ordered.  Thanks, Orlando Penner, RN, MSN, CCRN Diabetes Coordinator Inpatient Diabetes Program [phone] (Team Pager) [phone] (AP office) [phone] [facility] office)

## 2013-10-18 NOTE — Evaluation (Signed)
Clinical/Bedside Swallow Evaluation Patient Details  Name: Naresh Trivino MRN: [id number] Date of Birth: 05/20/1946  Today's Date: 10/18/2013 Time: 4503-8882 SLP Time Calculation (min): 10 min  Past Medical History:  Past Medical History  Diagnosis Date  . PTSD (post-traumatic stress disorder)   . Hypertension   . Hyperlipidemia   . Type II diabetes mellitus   . CAP (community acquired pneumonia) 10/17/2013  . OSA on CPAP   . Migraines     "long time ago; none lately" (10/17/2013)   Past Surgical History:  Past Surgical History  Procedure Laterality Date  . Joint replacement    . Total knee arthroplasty Right ~ 2012  . Total knee arthroplasty Left ~ 2013  . Excisional hemorrhoidectomy  1970's?   HPI:  Patient is a 68 year old male with past medical history of diabetes mellitus, hypertension, hyperlipidemia, tobacco abuse, presented to the ER with above symptoms. History was obtained from the patient who reported that he has been having subjective fevers, chills and productive cough with yellowish sputum for last 2 weeks. He went to [facility] on Thursday, 5/21, he was diagnosed with pneumonia and was discharged home with albuterol inhaler, prednisone and antibiotic which he does not remember patient. Patient reported that initially he felt to be improving however for last 3 days his symptoms have worsened despite taking the medications. His wife has similar cough.   Assessment / Plan / Recommendation Clinical Impression  Pt demonstrates normal swallow function. No evidence of aspiration or history of trouble swallow other than mild globus, likely related to pt reported reflux. Discussed strategies for esophageal dysphagia. No SLP f/u needed, will sign off.     Aspiration Risk  Mild    Diet Recommendation Regular;Thin liquid   Liquid Administration via: Cup;Straw Medication Administration: Whole meds with liquid Supervision: Patient able to self feed Postural Changes and/or  Swallow Maneuvers: Seated upright 90 degrees    Other  Recommendations Oral Care Recommendations: Oral care BID   Follow Up Recommendations  None    Frequency and Duration        Pertinent Vitals/Pain NA    SLP Swallow Goals     Swallow Study Prior Functional Status       General HPI: Patient is a 68 year old male with past medical history of diabetes mellitus, hypertension, hyperlipidemia, tobacco abuse, presented to the ER with above symptoms. History was obtained from the patient who reported that he has been having subjective fevers, chills and productive cough with yellowish sputum for last 2 weeks. He went to [facility] on Thursday, 5/21, he was diagnosed with pneumonia and was discharged home with albuterol inhaler, prednisone and antibiotic which he does not remember patient. Patient reported that initially he felt to be improving however for last 3 days his symptoms have worsened despite taking the medications. His wife has similar cough. Type of Study: Bedside swallow evaluation Previous Swallow Assessment: none Diet Prior to this Study: Regular;Thin liquids Temperature Spikes Noted: No Respiratory Status: Room air History of Recent Intubation: No Behavior/Cognition: Alert;Cooperative;Pleasant mood Oral Cavity - Dentition: Adequate natural dentition Self-Feeding Abilities: Able to feed self Patient Positioning: Upright in bed Baseline Vocal Quality: Clear Volitional Cough: Strong Volitional Swallow: Able to elicit    Oral/Motor/Sensory Function Overall Oral Motor/Sensory Function: Appears within functional limits for tasks assessed   Ice Chips     Thin Liquid Thin Liquid: Within functional limits    Nectar Thick Nectar Thick Liquid: Not tested   Honey Thick Honey Thick Liquid: Not  tested   Puree Puree: Within functional limits   Solid   GO    Solid: Within functional limits      Harlon DittyBonnie Rolen Conger, [place]MA CCC-SLP [phone]  Riley NearingBonnie Caroline Pearlee Arvizu 10/18/2013,10:58  AM

## 2013-10-19 ENCOUNTER — Inpatient Hospital Stay (HOSPITAL_COMMUNITY): Payer: Medicare Other

## 2013-10-19 LAB — GLUCOSE, CAPILLARY
GLUCOSE-CAPILLARY: 293 mg/dL — AB (ref 70–99)
Glucose-Capillary: 407 mg/dL — ABNORMAL HIGH (ref 70–99)
Glucose-Capillary: 287 mg/dL — ABNORMAL HIGH (ref 70–99)
Glucose-Capillary: 113 mg/dL — ABNORMAL HIGH (ref 70–99)
Glucose-Capillary: 121 mg/dL — ABNORMAL HIGH (ref 70–99)

## 2013-10-19 LAB — CBC
HCT: 31 % — ABNORMAL LOW (ref 39.0–52.0)
Hemoglobin: 11.2 g/dL — ABNORMAL LOW (ref 13.0–17.0)
MCH: 30 pg (ref 26.0–34.0)
MCHC: 36.1 g/dL — AB (ref 30.0–36.0)
MCV: 83.1 fL (ref 78.0–100.0)
PLATELETS: 302 10*3/uL (ref 150–400)
RBC: 3.73 MIL/uL — AB (ref 4.22–5.81)
RDW: 15.1 % (ref 11.5–15.5)
WBC: 15.2 10*3/uL — ABNORMAL HIGH (ref 4.0–10.5)

## 2013-10-19 LAB — HEMOGLOBIN A1C
Hgb A1c MFr Bld: 10 % — ABNORMAL HIGH (ref <5.7)
Mean Plasma Glucose: 240 mg/dL — ABNORMAL HIGH (ref <117)

## 2013-10-19 LAB — BASIC METABOLIC PANEL
BUN: 25 mg/dL — ABNORMAL HIGH (ref 6–23)
CALCIUM: 9.2 mg/dL (ref 8.4–10.5)
CO2: 24 mEq/L (ref 19–32)
Chloride: 99 mEq/L (ref 96–112)
Creatinine, Ser: 1.1 mg/dL (ref 0.50–1.35)
GFR, EST AFRICAN AMERICAN: 78 mL/min — AB (ref >90)
GFR, EST NON AFRICAN AMERICAN: 67 mL/min — AB (ref >90)
Glucose, Bld: 332 mg/dL — ABNORMAL HIGH (ref 70–99)
Potassium: 3.8 mEq/L (ref 3.7–5.3)
SODIUM: 136 meq/L — AB (ref 137–147)

## 2013-10-19 LAB — URINE CULTURE
Colony Count: NO GROWTH
Culture: NO GROWTH

## 2013-10-19 MED ORDER — INSULIN GLARGINE 100 UNIT/ML ~~LOC~~ SOLN
25.0000 [IU] | Freq: Two times a day (BID) | SUBCUTANEOUS | Status: DC
  Filled 2013-10-19 (×2): qty 0.25

## 2013-10-19 MED ORDER — INSULIN GLARGINE 100 UNIT/ML ~~LOC~~ SOLN
25.0000 [IU] | Freq: Every day | SUBCUTANEOUS | Status: DC
  Filled 2013-10-19: qty 0.25

## 2013-10-19 MED ORDER — INSULIN GLARGINE 100 UNIT/ML ~~LOC~~ SOLN
20.0000 [IU] | Freq: Two times a day (BID) | SUBCUTANEOUS | Status: DC
  Filled 2013-10-19 (×2): qty 0.2

## 2013-10-19 MED ORDER — IPRATROPIUM-ALBUTEROL 0.5-2.5 (3) MG/3ML IN SOLN
3.0000 mL | Freq: Two times a day (BID) | RESPIRATORY_TRACT | Status: DC
  Administered 2013-10-19 – 2013-10-20 (×2): 3 mL via RESPIRATORY_TRACT
  Filled 2013-10-19 (×2): qty 3

## 2013-10-19 MED ORDER — INSULIN ASPART 100 UNIT/ML ~~LOC~~ SOLN
6.0000 [IU] | Freq: Three times a day (TID) | SUBCUTANEOUS | Status: DC
  Administered 2013-10-19 – 2013-10-20 (×3): 6 [IU] via SUBCUTANEOUS

## 2013-10-19 NOTE — Progress Notes (Signed)
Pt refusing cpap again tonight, will continue to monitor.

## 2013-10-19 NOTE — Evaluation (Signed)
Physical Therapy Evaluation Patient Details Name: [name] Parks MRN: [id number] DOB: 1945/10/13 Today's Date: 10/19/2013   History of Present Illness  68 year old male with past medical history of diabetes mellitus, hypertension, hyperlipidemia, tobacco abuse. adm due to Coughing with shortness of breath, productive phlegm for the last 2 weeks.   Clinical Impression  Pt adm due to the above. Pt appears to be at baseline for mobility at this time. Pt challenged with high level balance activities; no LOB noted. Pt encouraged to ambulate unit as tolerated with nursing. No further acute PT needs at this time.     Follow Up Recommendations No PT follow up;Supervision - Intermittent    Equipment Recommendations  None recommended by PT    Recommendations for Other Services       Precautions / Restrictions Precautions Precautions: None Restrictions Weight Bearing Restrictions: No      Mobility  Bed Mobility Overal bed mobility: Independent             General bed mobility comments: no difficulties with bed moblity  Transfers Overall transfer level: Modified independent Equipment used:  (use of iv pole to stabilize )             General transfer comment: use of iv pole to stabilize; no LOB or sway noted  Ambulation/Gait Ambulation/Gait assistance: Modified independent (Device/Increase time) Ambulation Distance (Feet): 300 Feet Assistive device:  (iv pole) Gait Pattern/deviations: Step-through pattern;Wide base of support Gait velocity: WFL  Gait velocity interpretation: at or above normal speed for age/gender    Stairs Stairs: Yes Stairs assistance: Modified independent (Device/Increase time) Stair Management: One rail Right;One rail Left;Alternating pattern;Forwards Number of Stairs: 3 General stair comments: demo good technique; cued to use step to technique with pain in Lt LE; pt verbalized understanding   Wheelchair Mobility    Modified Rankin (Stroke  Patients Only)       Balance Overall balance assessment: Modified Independent                           High level balance activites: Direction changes;Sudden stops;Head turns High Level Balance Comments: also with gt speed changes; able to pick items up off ground during session; no LOB noted; pt appears to be at baseline              Pertinent Vitals/Pain No c/o pain     Home Living Family/patient expects to be discharged to:: Private residence Living Arrangements: Spouse/significant other;Children Available Help at Discharge: Family;Available 24 hours/day Type of Home: House Home Access: Stairs to enter Entrance Stairs-Rails: Right;Left;Can reach both Entrance Stairs-Number of Steps: 5 Home Layout: One level Home Equipment: Cane - single point      Prior Function Level of Independence: Independent;Independent with assistive device(s)         Comments: pt ambulating with spc for long distances; report he walks without cane in house     Hand Dominance        Extremity/Trunk Assessment   Upper Extremity Assessment: Overall WFL for tasks assessed           Lower Extremity Assessment: Overall WFL for tasks assessed      Cervical / Trunk Assessment: Normal  Communication   Communication: No difficulties  Cognition Arousal/Alertness: Awake/alert Behavior During Therapy: WFL for tasks assessed/performed Overall Cognitive Status: Within Functional Limits for tasks assessed  General Comments      Exercises        Assessment/Plan    PT Assessment Patent does not need any further PT services  PT Diagnosis     PT Problem List    PT Treatment Interventions     PT Goals (Current goals can be found in the Care Plan section) Acute Rehab PT Goals Patient Stated Goal: to go home tomorrow PT Goal Formulation: No goals set, d/c therapy    Frequency     Barriers to discharge        Co-evaluation                End of Session   Activity Tolerance: Patient tolerated treatment well Patient left: in chair;with call bell/phone within reach Nurse Communication: Mobility status         Time: 1000-1016 PT Time Calculation (min): 16 min   Charges:   PT Evaluation $Initial PT Evaluation Tier I: 1 Procedure PT Treatments $Gait Training: 8-22 mins   PT G CodesNadara Parks:          [name] Parks N [name] Parks, [name]  [phone] 10/19/2013, 12:11 PM

## 2013-10-19 NOTE — Progress Notes (Signed)
PROGRESS NOTE  [name] Parks DQQ:[id number] DOB: [birth year] DOA: 10/17/2013 PCP: No primary provider on file. [employer].  Patient is a 68 year old male with past medical history of diabetes mellitus, hypertension, hyperlipidemia, tobacco abuse, presented to the ER with above symptoms. History was obtained from the patient who reported that he has been having subjective fevers, chills and productive cough with yellowish sputum for last 2 weeks. He went to [facility] on Thursday, 5/21, he was diagnosed with pneumonia and was discharged home with albuterol inhaler, prednisone and antibiotic which he does not remember patient. Patient reported that initially he felt to be improving however for last 3 days his symptoms have worsened despite taking the medications. His wife has similar cough.    Assessment/Plan:  Community Acquired Pneumonia Failed outpatient treatment (Azith 250 mg and Prednisone 20 mg starting 5/21) HIV non reactive.  Urine Legionella and strep pneumo are negative. Blood cultures -no growth to date. Improving on IV Vanc and Cefepime. Will continue IV antibiotics for one more day. Supportive care with [facility].  He was started on IV solumedrol on admission, but this has been discontinued. Patient ambulated on room air with oxygen sats at 100 %  Tobacco abuse On nicotine patch. Has tried to quit in the past.  Counseled on need to quit.  DM CBGs difficult to control in the hospital likely exacerbated by steroids. Increased lantus to 25 each bedtime and added novolog at mealtime coverage in addition to sliding scale resistant.  Glipizide added back on 5/27 Continue to monitor.  Steroids have been discontinued which may help.  Hypertension Continue amlodipine and chlorthalidone  Hyperlipidemia  Continue statin.  PTSD:   Continue paxil.  Swallowing difficulty Chronic  Speech eval completed. No further recommendations.  Hyponatremia. Mild 131. Now improved.  (5/28) Presumably due to pneumonia. Will monitor.  IVF are saline locked.   DVT Prophylaxis:  lovenox  Code Status: full Family Communication:  Disposition Plan: inpatient. Discharge to home likely 5/29   Antibiotics: Anti-infectives   Start     Dose/Rate Route Frequency Ordered Stop   10/17/13 1800  ceFEPIme (MAXIPIME) 1 g in dextrose 5 % 50 mL IVPB     1 g 100 mL/hr over 30 Minutes Intravenous Every 8 hours 10/17/13 1630 10/25/13 1759   10/17/13 1730  vancomycin (VANCOCIN) IVPB 750 mg/150 ml premix     750 mg 150 mL/hr over 60 Minutes Intravenous Every 12 hours 10/17/13 1654     10/17/13 1415  azithromycin (ZITHROMAX) 500 mg in dextrose 5 % 250 mL IVPB  Status:  Discontinued     500 mg 250 mL/hr over 60 Minutes Intravenous  Once 10/17/13 1409 10/17/13 1630   10/17/13 1415  cefTRIAXone (ROCEPHIN) 1 g in dextrose 5 % 50 mL IVPB     1 g 100 mL/hr over 30 Minutes Intravenous  Once 10/17/13 1409 10/17/13 1607        HPI/Subjective: No complaints. Reports he is slowly getting better.   Objective: Filed Vitals:   10/18/13 1633 10/18/13 2102 10/18/13 2152 10/19/13 0513  BP:  112/66  138/73  Pulse:  72  69  Temp:  98.2 F (36.8 C)  98.1 F (36.7 C)  TempSrc:  Oral  Axillary  Resp:  12  12  Height:      Weight:      SpO2: 99% 97% 98% 97%    Intake/Output Summary (Last 24 hours) at 10/19/13 1128 Last data filed at 10/19/13 0532  Gross  per 24 hour  Intake 2621.25 ml  Output   1800 ml  Net 821.25 ml   Filed Weights   10/17/13 1628  Weight: 88.451 kg (195 lb)    Exam: General: Well developed, well nourished, NAD, appears stated age. Pleasant. HEENT:   Anicteic Sclera, MMM. No pharyngeal erythema or exudates  Neck: Supple, no JVD, no masses  Cardiovascular: RRR, S1 S2 auscultated, no rubs, murmurs or gallops.   Respiratory:  No increased work of breathing.  Decreased breath sounds. Abdomen: obese, Soft, nontender, nondistended, + bowel sounds  Extremities:  warm dry without cyanosis clubbing or edema.  Neuro: AAOx3, cranial nerves grossly intact. Strength 5/5 in upper and lower extremities  Skin: Without rashes exudates or nodules.   Psych: Normal affect and demeanor with intact judgement and insight   Data Reviewed: Basic Metabolic Panel:  Recent Labs Lab 10/17/13 1407 10/17/13 1918 10/18/13 1025 10/19/13 0530  NA 134*  --  131* 136*  K 4.0  --  4.2 3.8  CL 93*  --  92* 99  CO2 24  --  22 24  GLUCOSE 339*  --  414* 332*  BUN 30*  --  29* 25*  CREATININE 1.21 1.17 1.11 1.10  CALCIUM 10.6*  --  9.7 9.2   CBC:  Recent Labs Lab 10/17/13 1407 10/17/13 1918 10/18/13 1025 10/19/13 0530  WBC 14.3* 12.2* 12.5* 15.2*  NEUTROABS 8.4*  --   --   --   HGB 13.1 12.7* 12.1* 11.2*  HCT 36.4* 34.0* 33.3* 31.0*  MCV 82.9 82.7 84.7 83.1  PLT 350 333 335 302   BNP (last 3 results)  Recent Labs  10/17/13 1407  PROBNP 53.6   CBG:  Recent Labs Lab 10/18/13 1504 10/18/13 1653 10/18/13 2217 10/19/13 0249 10/19/13 0754  GLUCAP 313* 220* 165* 407* 287*      Studies: Dg Chest 2 View  10/19/2013   CLINICAL DATA:  Pneumonia  EXAM: CHEST  2 VIEW  COMPARISON:  10/17/2013  FINDINGS: Normal cardiac silhouette. Scoliosis noted. There is bibasilar airspace disease not changed from prior. No pneumothorax. Lungs are hyperinflated. Degenerative osteophytosis of the thoracic spine.  IMPRESSION: 1. No significant change. 2. Stable bibasilar airspace disease and hyperinflated lungs.   Electronically Signed   By: Genevive Bi M.D.   On: 10/19/2013 07:44   Dg Chest 2 View (if Patient Has Fever And/or Copd)  10/17/2013   CLINICAL DATA:  Difficulty breathing. Weakness. Intermittent chest pain.  EXAM: CHEST  2 VIEW  COMPARISON:  None.  FINDINGS: The heart size is normal. Mild emphysematous changes are evident. Posterior right lower lobe airspace disease is concerning for pneumonia. The left lung is clear. Atherosclerotic changes are noted at the  aortic arch. Rightward curvature is present in the thoracic spine. Vertebral body heights are otherwise maintained.  IMPRESSION: 1. Ill-defined right lower lobe airspace disease is concerning for bronchopneumonia. 2. Emphysema. 3. Atherosclerosis. 4. Rightward curvature of the mid thoracic spine.   Electronically Signed   By: Gennette Pac M.D.   On: 10/17/2013 13:44    Scheduled Meds: . aspirin EC  81 mg Oral Daily  . atorvastatin  80 mg Oral q1800  . budesonide  0.25 mg Nebulization BID  . ceFEPime (MAXIPIME) IV  1 g Intravenous Q8H  . chlorthalidone  12.5 mg Oral Daily  . cholecalciferol  1,000 Units Oral Daily  . enoxaparin (LOVENOX) injection  40 mg Subcutaneous Q24H  . gabapentin  300 mg Oral 2 times  per day  . gabapentin  900 mg Oral QHS  . glipiZIDE  5 mg Oral BID WC  . insulin aspart  0-20 Units Subcutaneous TID WC  . insulin aspart  0-5 Units Subcutaneous QHS  . insulin glargine  20 Units Subcutaneous BID  . ipratropium-albuterol  3 mL Nebulization BID  . metoprolol tartrate  50 mg Oral BID  . nicotine  21 mg Transdermal Daily  . pantoprazole  40 mg Oral BID AC  . PARoxetine  40 mg Oral Daily  . senna-docusate  2 tablet Oral QHS  . terazosin  10 mg Oral QHS  . vancomycin  750 mg Intravenous Q12H   Continuous Infusions:   Principal Problem:   HCAP (healthcare-associated pneumonia) Active Problems:   COPD exacerbation   Leukocytosis   Nicotine abuse   Diabetes mellitus   Hypertension   Hyperlipidemia    Stephani PoliceMarianne L York, PA-C  [facility] Pager [phone]. If 7PM-7AM, please contact night-coverage at [email or web address], password [email or web address] 10/19/2013, 11:28 AM  LOS: 2 days

## 2013-10-19 NOTE — Significant Event (Signed)
Notified MD Schorr of blood glucose of 407; ordered to continue to monitor and wait to see am lab results

## 2013-10-19 NOTE — Progress Notes (Signed)
  I have directly reviewed the clinical findings, lab, imaging studies and management of this patient in detail. I have interviewed and examined the patient and agree with the documentation,  as recorded by the Physician extender.  Ashira Kirsten K Sae Handrich M.D on 10/19/2013 at 2:16 PM  [facility] Office  [phone]  

## 2013-10-20 LAB — EXPECTORATED SPUTUM ASSESSMENT W REFEX TO RESP CULTURE

## 2013-10-20 LAB — EXPECTORATED SPUTUM ASSESSMENT W GRAM STAIN, RFLX TO RESP C

## 2013-10-20 LAB — GLUCOSE, CAPILLARY
Glucose-Capillary: 168 mg/dL — ABNORMAL HIGH (ref 70–99)
Glucose-Capillary: 258 mg/dL — ABNORMAL HIGH (ref 70–99)

## 2013-10-20 MED ORDER — LEVOFLOXACIN 750 MG PO TABS: 750.0000 mg | ORAL_TABLET | Freq: Every day | ORAL | Status: DC

## 2013-10-20 MED ORDER — NICOTINE 21 MG/24HR TD PT24: 21.0000 mg | MEDICATED_PATCH | Freq: Every day | TRANSDERMAL | Status: DC

## 2013-10-20 MED ORDER — METOPROLOL TARTRATE 50 MG PO TABS: 50.0000 mg | ORAL_TABLET | Freq: Two times a day (BID) | ORAL | Status: DC

## 2013-10-20 MED ORDER — INSULIN GLARGINE 100 UNIT/ML ~~LOC~~ SOLN: 25.0000 [IU] | Freq: Every day | SUBCUTANEOUS | Status: AC

## 2013-10-20 MED ORDER — INSULIN ASPART 100 UNIT/ML FLEXPEN
6.0000 [IU] | PEN_INJECTOR | Freq: Three times a day (TID) | SUBCUTANEOUS | Status: DC
Start: 2013-10-20 — End: 2013-10-20

## 2013-10-20 MED ORDER — INSULIN ASPART 100 UNIT/ML ~~LOC~~ SOLN: 6.0000 [IU] | Freq: Three times a day (TID) | SUBCUTANEOUS | Status: AC

## 2013-10-20 NOTE — Progress Notes (Signed)
Patient discharge teaching given, including activity, diet, follow-up appoints, and medications. Patient verbalized understanding of all discharge instructions. IV access was d/c'd. Vitals are stable. Skin is intact except as charted in most recent assessments. Pt to be escorted out by NT, to be driven home by family. 

## 2013-10-20 NOTE — Discharge Summary (Signed)
  I have directly reviewed the clinical findings, lab, imaging studies and management of this patient in detail. I have interviewed and examined the patient and agree with the documentation,  as recorded by the Physician extender.  Leroy Sea M.D on 10/20/2013 at 10:53 AM  [facility] Office  [phone]

## 2013-10-20 NOTE — Discharge Instructions (Signed)
Your blood pressure medications have been changed.  Amlodipine has been discontinued and Metoprolol has been started.    Your Diabetes medications have been changed.  Lantus has been increased to 25 units at bedtime.  Novolog has been added -  6 units with each meal.  Please see Dr. Azucena Kuba in 5- 10 days.

## 2013-10-20 NOTE — Discharge Summary (Signed)
Physician Discharge Summary  Torivio Virola GTX:[id number] DOB: 04-11-1946 DOA: 10/17/2013  PCP: Caroline Sauger, MD  Admit date: 10/17/2013 Discharge date: 10/20/2013  Time spent: 50 minutes  Recommendations for Outpatient Follow-up:  1. Admitted with Pneumonia.  Treated with IV antibiotics inpatient.  Levaquin outpatient for a total of 7days. 2. A1C is 10.  Insulin increased.  Novolog added with meals. 3. Blood pressure medications were changed - Metoprolol added as patient was tachycardic.  Amlodipine discontinued.  Discharge Diagnoses:  Principal Problem:   HCAP (healthcare-associated pneumonia) Active Problems:   COPD exacerbation   Leukocytosis   Nicotine abuse   Diabetes mellitus   Hypertension   Hyperlipidemia   Discharge Condition: stable. Diet recommendation: carb modified.  Filed Weights   10/17/13 1628  Weight: 88.451 kg (195 lb)    History of present illness:  Patient is a 68 year old male with past medical history of diabetes mellitus, hypertension, hyperlipidemia, tobacco abuse, presented to the ER with above symptoms. History was obtained from the patient who reported that he has been having subjective fevers, chills and productive cough with yellowish sputum for last 2 weeks. He went to [facility] on Thursday, 5/21, he was diagnosed with pneumonia and was discharged home with albuterol inhaler, prednisone and antibiotic which he does not remember patient. Patient reported that initially he felt to be improving however for last 3 days his symptoms have worsened despite taking the medications. His wife has similar cough.   Hospital Course:  HCAP (healthcare-associated pneumonia) and COPD exacerbation: Failed outpatient therapy   Place on IV vancomycin and cefepime.   urine legionella antigen, and  urine strep were negative received supportive treatment with nebulizers, pulmicort, and steroids. place on nicotine patch   COPD exacerbation: As #1   Nicotine abuse   Patient was counseled strongly on quitting smoking, placed on nicotine patch   Diabetes mellitus  A1c was 10.0, Was already on Lantus, metformin and glipizide.  Added Novolog with meals. Needs DM follow up.  Hypertension  patient was initially tachycardic. He was started on metoprolol 50 bid.  Amlodipine was discontinued.  Continue Chlorthalidone.   Hyperlipidemia  Continue statin   Anxiety, PTSD:  Continue Paxil  Hyponatremia.  Mild 131.  Now improved. (5/28)  Presumably due to pneumonia.  Will monitor. IVF are saline locked.     Discharge Exam: Filed Vitals:   10/20/13 0940  BP: 111/67  Pulse: 69  Temp:   Resp:    General: Well developed, well nourished, NAD, appears stated age. Pleasant.  HEENT: Anicteic Sclera, MMM. No pharyngeal erythema or exudates  Neck: Supple, no JVD, no masses  Cardiovascular: RRR, S1 S2 auscultated, no rubs, murmurs or gallops.  Respiratory: No increased work of breathing. Mild exp wheeze on the right.  Abdomen: obese, Soft, nontender, nondistended, + bowel sounds  Extremities: warm dry without cyanosis clubbing or edema.  Neuro: AAOx3, cranial nerves grossly intact. Strength 5/5 in upper and lower extremities  Skin: Without rashes exudates or nodules.  Psych: Normal affect and demeanor with intact judgement and insight    Discharge Instructions       Discharge Instructions   Diet - low sodium heart healthy    Complete by:  As directed      Increase activity slowly    Complete by:  As directed             Medication List    STOP taking these medications       amLODipine 5 MG tablet  Commonly  known as:  NORVASC      TAKE these medications       aspirin EC 81 MG tablet  Take 81 mg by mouth daily.     atorvastatin 80 MG tablet  Commonly known as:  LIPITOR  Take 80 mg by mouth at bedtime.     chlorthalidone 25 MG tablet  Commonly known as:  HYGROTON  Take 12.5 mg by mouth daily.     cholecalciferol 1000 UNITS  tablet  Commonly known as:  VITAMIN D  Take 1,000 Units by mouth daily.     flunisolide 25 MCG/ACT (0.025%) Soln  Commonly known as:  NASALIDE  Place 2 sprays into the nose 2 (two) times daily as needed (for sinuses).     gabapentin 300 MG capsule  Commonly known as:  NEURONTIN  Take 300-900 mg by mouth 3 (three) times daily. Take 300 mg every morning and at 2 pm and take 900 mg at bedtime     glipiZIDE 5 MG tablet  Commonly known as:  GLUCOTROL  Take by mouth 2 (two) times daily.     HYDROcodone-acetaminophen 10-325 MG per tablet  Commonly known as:  NORCO  Take 1 tablet by mouth 4 (four) times daily as needed (for pain).     insulin aspart 100 UNIT/ML injection  Commonly known as:  novoLOG  Inject 6 Units into the skin 3 (three) times daily with meals.     insulin glargine 100 UNIT/ML injection  Commonly known as:  LANTUS  Inject 0.25 mLs (25 Units total) into the skin at bedtime.     levofloxacin 750 MG tablet  Commonly known as:  LEVAQUIN  Take 1 tablet (750 mg total) by mouth daily.     metFORMIN 500 MG tablet  Commonly known as:  GLUCOPHAGE  Take 500 mg by mouth 3 (three) times daily.     metoprolol 50 MG tablet  Commonly known as:  LOPRESSOR  Take 1 tablet (50 mg total) by mouth 2 (two) times daily.     naproxen 375 MG tablet  Commonly known as:  NAPROSYN  Take 375 mg by mouth 2 (two) times daily as needed (for knee pain).     nicotine 21 mg/24hr patch  Commonly known as:  NICODERM CQ - dosed in mg/24 hours  Place 1 patch (21 mg total) onto the skin daily.     omeprazole 20 MG capsule  Commonly known as:  PRILOSEC  Take 20 mg by mouth 2 (two) times daily.     PARoxetine 40 MG tablet  Commonly known as:  PAXIL  Take 40 mg by mouth daily.     potassium chloride SA 20 MEQ tablet  Commonly known as:  K-DUR,KLOR-CON  Take 20 mEq by mouth daily.     senna-docusate 8.6-50 MG per tablet  Commonly known as:  Senokot-S  Take 2 tablets by mouth at bedtime.      temazepam 30 MG capsule  Commonly known as:  RESTORIL  Take 30 mg by mouth at bedtime as needed for sleep.     terazosin 10 MG capsule  Commonly known as:  HYTRIN  Take 10 mg by mouth at bedtime.       No Known Allergies    The results of significant diagnostics from this hospitalization (including imaging, microbiology, ancillary and laboratory) are listed below for reference.    Significant Diagnostic Studies: Dg Chest 2 View  10/19/2013   CLINICAL DATA:  Pneumonia  EXAM: CHEST  2  VIEW  COMPARISON:  10/17/2013  FINDINGS: Normal cardiac silhouette. Scoliosis noted. There is bibasilar airspace disease not changed from prior. No pneumothorax. Lungs are hyperinflated. Degenerative osteophytosis of the thoracic spine.  IMPRESSION: 1. No significant change. 2. Stable bibasilar airspace disease and hyperinflated lungs.   Electronically Signed   By: Genevive Bi M.D.   On: 10/19/2013 07:44   Dg Chest 2 View (if Patient Has Fever And/or Copd)  10/17/2013   CLINICAL DATA:  Difficulty breathing. Weakness. Intermittent chest pain.  EXAM: CHEST  2 VIEW  COMPARISON:  None.  FINDINGS: The heart size is normal. Mild emphysematous changes are evident. Posterior right lower lobe airspace disease is concerning for pneumonia. The left lung is clear. Atherosclerotic changes are noted at the aortic arch. Rightward curvature is present in the thoracic spine. Vertebral body heights are otherwise maintained.  IMPRESSION: 1. Ill-defined right lower lobe airspace disease is concerning for bronchopneumonia. 2. Emphysema. 3. Atherosclerosis. 4. Rightward curvature of the mid thoracic spine.   Electronically Signed   By: Gennette Pac M.D.   On: 10/17/2013 13:44    Microbiology: Recent Results (from the past 240 hour(s))  URINE CULTURE     Status: None   Collection Time    10/17/13  4:39 PM      Result Value Ref Range Status   Specimen Description URINE, RANDOM   Final   Special Requests NONE   Final    Culture  Setup Time     Final   Value: 10/17/2013 23:49     Performed at [facility] Count     Final   Value: NO GROWTH     Performed at [facility]   Culture     Final   Value: NO GROWTH     Performed at [facility]   Report Status 10/18/2013 FINAL   Final  CULTURE, BLOOD (ROUTINE X 2)     Status: None   Collection Time    10/17/13  7:10 PM      Result Value Ref Range Status   Specimen Description BLOOD LEFT HAND   Final   Special Requests BOTTLES DRAWN AEROBIC ONLY 2CC   Final   Culture  Setup Time     Final   Value: 10/18/2013 00:26     Performed at [facility]   Culture     Final   Value:        BLOOD CULTURE RECEIVED NO GROWTH TO DATE CULTURE WILL BE HELD FOR 5 DAYS BEFORE ISSUING A FINAL NEGATIVE REPORT     Performed at [facility]   Report Status PENDING   Incomplete  CULTURE, BLOOD (ROUTINE X 2)     Status: None   Collection Time    10/17/13  7:18 PM      Result Value Ref Range Status   Specimen Description BLOOD LEFT HAND   Final   Special Requests BOTTLES DRAWN AEROBIC ONLY 3CC   Final   Culture  Setup Time     Final   Value: 10/18/2013 00:26     Performed at [facility]   Culture     Final   Value:        BLOOD CULTURE RECEIVED NO GROWTH TO DATE CULTURE WILL BE HELD FOR 5 DAYS BEFORE ISSUING A FINAL NEGATIVE REPORT     Performed at [facility]   Report Status PENDING   Incomplete  URINE CULTURE  Status: None   Collection Time    10/18/13  3:34 PM      Result Value Ref Range Status   Specimen Description URINE, RANDOM   Final   Special Requests NONE   Final   Culture  Setup Time     Final   Value: 10/18/2013 16:37     [facility]   [place] Count     Final   Value: NO GROWTH     [facility]   Culture     Final   Value: NO GROWTH     [facility]   Report Status 10/19/2013 FINAL   Final  CULTURE, EXPECTORATED  SPUTUM-ASSESSMENT     Status: None   Collection Time    10/19/13 11:33 PM      Result Value Ref Range Status   Specimen Description SPUTUM   Final   Special Requests NONE   Final   Sputum evaluation     Final   Value: MICROSCOPIC FINDINGS SUGGEST THAT THIS SPECIMEN IS NOT REPRESENTATIVE OF LOWER RESPIRATORY SECRETIONS. PLEASE RECOLLECT.     RESULT CALLED TO, READ BACK BY AND VERIFIED WITH: J WOOSLEY,RN 10/20/13 0035 BY RHOLMES   Report Status 10/20/2013 FINAL   Final     Labs: Basic Metabolic Panel:  Recent Labs Lab 10/17/13 1407 10/17/13 1918 10/18/13 1025 10/19/13 0530  NA 134*  --  131* 136*  K 4.0  --  4.2 3.8  CL 93*  --  92* 99  CO2 24  --  22 24  GLUCOSE 339*  --  414* 332*  BUN 30*  --  29* 25*  CREATININE 1.21 1.17 1.11 1.10  CALCIUM 10.6*  --  9.7 9.2   CBC:  Recent Labs Lab 10/17/13 1407 10/17/13 1918 10/18/13 1025 10/19/13 0530  WBC 14.3* 12.2* 12.5* 15.2*  NEUTROABS 8.4*  --   --   --   HGB 13.1 12.7* 12.1* 11.2*  HCT 36.4* 34.0* 33.3* 31.0*  MCV 82.9 82.7 84.7 83.1  PLT 350 333 335 302   BNP: BNP (last 3 results)  Recent Labs  10/17/13 1407  PROBNP 53.6   CBG:  Recent Labs Lab 10/19/13 0754 10/19/13 1141 10/19/13 1655 10/19/13 2014 10/20/13 0748  GLUCAP 287* 293* 113* 121* 168*     Signed:  Conley CanalMarianne L York, PA-C [phone]  [facility] 10/20/2013, 9:46 AM

## 2013-10-20 NOTE — Care Management Note (Signed)
    Page 1 of 1   10/20/2013     2:27:06 PM CARE MANAGEMENT NOTE 10/20/2013  Patient:  [name] Parks, [name] Parks   Account Number:  [id number]  Date Initiated:  10/20/2013  Documentation initiated by:  Letha Cape  Subjective/Objective Assessment:   dx pna  admit- lives with spouse     Action/Plan:   pt eval- no pt f/u needed.   Anticipated DC Date:  10/20/2013   Anticipated DC Plan:  HOME/SELF CARE      DC Planning Services  CM consult      Choice offered to / List presented to:             Status of service:  Completed, signed off Medicare Important Message given?  YES (If response is "NO", the following Medicare IM given date fields will be blank) Date Medicare IM given:  10/20/2013 Date Additional Medicare IM given:    Discharge Disposition:  HOME/SELF CARE  Per UR Regulation:  Reviewed for med. necessity/level of care/duration of stay  If discussed at Long Length of Stay Meetings, dates discussed:    Comments:  10/20/13 1426 Letha Cape RN, SNF [phone] patient is for dc today, per physical therapy no pt fu needed.

## 2013-10-24 LAB — CULTURE, BLOOD (ROUTINE X 2)
Culture: NO GROWTH
Culture: NO GROWTH

## 2014-05-07 ENCOUNTER — Inpatient Hospital Stay (HOSPITAL_COMMUNITY)
Admission: EM | Admit: 2014-05-07 | Discharge: 2014-05-08 | DRG: 192 | Disposition: A | Discharge status: Home / Self Care | Payer: Medicare Other | Attending: Family Medicine | Admitting: Family Medicine

## 2014-05-07 ENCOUNTER — Encounter (HOSPITAL_COMMUNITY): Payer: Self-pay | Admitting: Nurse Practitioner

## 2014-05-07 ENCOUNTER — Emergency Department (HOSPITAL_COMMUNITY): Payer: Medicare Other

## 2014-05-07 DIAGNOSIS — R0602 Shortness of breath: Secondary | ICD-10-CM | POA: Insufficient documentation

## 2014-05-07 DIAGNOSIS — G8929 Other chronic pain: Secondary | ICD-10-CM | POA: Diagnosis present

## 2014-05-07 DIAGNOSIS — Z7982 Long term (current) use of aspirin: Secondary | ICD-10-CM

## 2014-05-07 DIAGNOSIS — Z96651 Presence of right artificial knee joint: Secondary | ICD-10-CM | POA: Diagnosis present

## 2014-05-07 DIAGNOSIS — M79606 Pain in leg, unspecified: Secondary | ICD-10-CM | POA: Diagnosis present

## 2014-05-07 DIAGNOSIS — G4733 Obstructive sleep apnea (adult) (pediatric): Secondary | ICD-10-CM | POA: Diagnosis present

## 2014-05-07 DIAGNOSIS — R0600 Dyspnea, unspecified: Secondary | ICD-10-CM | POA: Diagnosis present

## 2014-05-07 DIAGNOSIS — F4310 Post-traumatic stress disorder, unspecified: Secondary | ICD-10-CM | POA: Diagnosis present

## 2014-05-07 DIAGNOSIS — E785 Hyperlipidemia, unspecified: Secondary | ICD-10-CM | POA: Diagnosis present

## 2014-05-07 DIAGNOSIS — E119 Type 2 diabetes mellitus without complications: Secondary | ICD-10-CM | POA: Diagnosis present

## 2014-05-07 DIAGNOSIS — G43909 Migraine, unspecified, not intractable, without status migrainosus: Secondary | ICD-10-CM | POA: Diagnosis present

## 2014-05-07 DIAGNOSIS — I10 Essential (primary) hypertension: Secondary | ICD-10-CM | POA: Diagnosis present

## 2014-05-07 DIAGNOSIS — F17210 Nicotine dependence, cigarettes, uncomplicated: Secondary | ICD-10-CM | POA: Diagnosis present

## 2014-05-07 DIAGNOSIS — D649 Anemia, unspecified: Secondary | ICD-10-CM | POA: Diagnosis present

## 2014-05-07 DIAGNOSIS — J441 Chronic obstructive pulmonary disease with (acute) exacerbation: Principal | ICD-10-CM | POA: Diagnosis present

## 2014-05-07 DIAGNOSIS — Z794 Long term (current) use of insulin: Secondary | ICD-10-CM

## 2014-05-07 DIAGNOSIS — Z96652 Presence of left artificial knee joint: Secondary | ICD-10-CM | POA: Diagnosis present

## 2014-05-07 LAB — BASIC METABOLIC PANEL
Anion gap: 15 (ref 5–15)
BUN: 12 mg/dL (ref 6–23)
CHLORIDE: 98 meq/L (ref 96–112)
CO2: 27 mEq/L (ref 19–32)
Calcium: 9.9 mg/dL (ref 8.4–10.5)
Creatinine, Ser: 1.13 mg/dL (ref 0.50–1.35)
GFR calc Af Amer: 75 mL/min — ABNORMAL LOW (ref >90)
GFR calc non Af Amer: 65 mL/min — ABNORMAL LOW (ref >90)
GLUCOSE: 158 mg/dL — AB (ref 70–99)
POTASSIUM: 3.6 meq/L — AB (ref 3.7–5.3)
Sodium: 140 mEq/L (ref 137–147)

## 2014-05-07 LAB — CBC
HCT: 36.3 % — ABNORMAL LOW (ref 39.0–52.0)
Hemoglobin: 12.3 g/dL — ABNORMAL LOW (ref 13.0–17.0)
MCH: 28.1 pg (ref 26.0–34.0)
MCHC: 33.9 g/dL (ref 30.0–36.0)
MCV: 82.9 fL (ref 78.0–100.0)
Platelets: 245 10*3/uL (ref 150–400)
RBC: 4.38 MIL/uL (ref 4.22–5.81)
RDW: 14.9 % (ref 11.5–15.5)
WBC: 5.6 10*3/uL (ref 4.0–10.5)

## 2014-05-07 LAB — GLUCOSE, CAPILLARY
GLUCOSE-CAPILLARY: 283 mg/dL — AB (ref 70–99)
Glucose-Capillary: 181 mg/dL — ABNORMAL HIGH (ref 70–99)

## 2014-05-07 LAB — TROPONIN I: Troponin I: <0.3 ng/mL (ref <0.30)

## 2014-05-07 LAB — PRO B NATRIURETIC PEPTIDE: PRO B NATRI PEPTIDE: 123.9 pg/mL (ref 0–125)

## 2014-05-07 LAB — I-STAT TROPONIN, ED: TROPONIN I, POC: 0.01 ng/mL (ref 0.00–0.08)

## 2014-05-07 MED ORDER — SODIUM CHLORIDE 0.9 % IJ SOLN
3.0000 mL | Freq: Two times a day (BID) | INTRAMUSCULAR | Status: DC
  Administered 2014-05-07: 3 mL via INTRAVENOUS

## 2014-05-07 MED ORDER — INSULIN ASPART 100 UNIT/ML ~~LOC~~ SOLN
0.0000 [IU] | Freq: Three times a day (TID) | SUBCUTANEOUS | Status: DC
  Administered 2014-05-08: 5 [IU] via SUBCUTANEOUS

## 2014-05-07 MED ORDER — ALBUTEROL SULFATE (2.5 MG/3ML) 0.083% IN NEBU: 2.5000 mg | INHALATION_SOLUTION | RESPIRATORY_TRACT | Status: DC | PRN

## 2014-05-07 MED ORDER — GABAPENTIN 300 MG PO CAPS
300.0000 mg | ORAL_CAPSULE | ORAL | Status: DC
  Administered 2014-05-08 (×2): 300 mg via ORAL
  Filled 2014-05-07 (×2): qty 1

## 2014-05-07 MED ORDER — MAGNESIUM SULFATE 2 GM/50ML IV SOLN
2.0000 g | Freq: Once | INTRAVENOUS | Status: AC
  Administered 2014-05-07: 2 g via INTRAVENOUS
  Filled 2014-05-07: qty 50

## 2014-05-07 MED ORDER — ASPIRIN EC 81 MG PO TBEC
81.0000 mg | DELAYED_RELEASE_TABLET | Freq: Every day | ORAL | Status: DC
  Administered 2014-05-08: 81 mg via ORAL
  Filled 2014-05-07 (×2): qty 1

## 2014-05-07 MED ORDER — TEMAZEPAM 15 MG PO CAPS: 30.0000 mg | ORAL_CAPSULE | Freq: Every evening | ORAL | Status: DC | PRN

## 2014-05-07 MED ORDER — IPRATROPIUM-ALBUTEROL 0.5-2.5 (3) MG/3ML IN SOLN
3.0000 mL | Freq: Four times a day (QID) | RESPIRATORY_TRACT | Status: DC
  Administered 2014-05-08 (×3): 3 mL via RESPIRATORY_TRACT
  Filled 2014-05-07 (×3): qty 3

## 2014-05-07 MED ORDER — IPRATROPIUM-ALBUTEROL 0.5-2.5 (3) MG/3ML IN SOLN
3.0000 mL | Freq: Once | RESPIRATORY_TRACT | Status: AC
  Administered 2014-05-07: 3 mL via RESPIRATORY_TRACT
  Filled 2014-05-07: qty 3

## 2014-05-07 MED ORDER — PREDNISONE 20 MG PO TABS
60.0000 mg | ORAL_TABLET | Freq: Once | ORAL | Status: AC
  Administered 2014-05-07: 60 mg via ORAL
  Filled 2014-05-07: qty 3

## 2014-05-07 MED ORDER — HEPARIN SODIUM (PORCINE) 5000 UNIT/ML IJ SOLN
5000.0000 [IU] | Freq: Three times a day (TID) | INTRAMUSCULAR | Status: DC
  Administered 2014-05-07: 5000 [IU] via SUBCUTANEOUS
  Filled 2014-05-07 (×4): qty 1

## 2014-05-07 MED ORDER — PANTOPRAZOLE SODIUM 40 MG PO TBEC
40.0000 mg | DELAYED_RELEASE_TABLET | Freq: Every day | ORAL | Status: DC
  Administered 2014-05-08: 40 mg via ORAL
  Filled 2014-05-07: qty 1

## 2014-05-07 MED ORDER — PREDNISONE 50 MG PO TABS
50.0000 mg | ORAL_TABLET | Freq: Every day | ORAL | Status: DC
  Filled 2014-05-07 (×2): qty 1

## 2014-05-07 MED ORDER — METOPROLOL TARTRATE 50 MG PO TABS
50.0000 mg | ORAL_TABLET | Freq: Two times a day (BID) | ORAL | Status: DC
Start: 2014-05-07 — End: 2014-05-08
  Administered 2014-05-07 – 2014-05-08 (×2): 50 mg via ORAL
  Filled 2014-05-07 (×3): qty 1

## 2014-05-07 MED ORDER — GABAPENTIN 300 MG PO CAPS
900.0000 mg | ORAL_CAPSULE | Freq: Every day | ORAL | Status: DC
  Administered 2014-05-07: 900 mg via ORAL
  Filled 2014-05-07 (×2): qty 3

## 2014-05-07 MED ORDER — GABAPENTIN 300 MG PO CAPS
300.0000 mg | ORAL_CAPSULE | Freq: Three times a day (TID) | ORAL | Status: DC
  Filled 2014-05-07 (×2): qty 3

## 2014-05-07 MED ORDER — SENNOSIDES-DOCUSATE SODIUM 8.6-50 MG PO TABS
2.0000 | ORAL_TABLET | Freq: Every day | ORAL | Status: DC
  Administered 2014-05-07 – 2014-05-08 (×2): 2 via ORAL
  Filled 2014-05-07 (×2): qty 2

## 2014-05-07 MED ORDER — ATORVASTATIN CALCIUM 80 MG PO TABS
80.0000 mg | ORAL_TABLET | Freq: Every day | ORAL | Status: DC
  Administered 2014-05-07: 80 mg via ORAL
  Filled 2014-05-07 (×2): qty 1

## 2014-05-07 MED ORDER — PREDNISONE 50 MG PO TABS
50.0000 mg | ORAL_TABLET | Freq: Every day | ORAL | Status: DC
  Administered 2014-05-08: 50 mg via ORAL
  Filled 2014-05-07 (×2): qty 1

## 2014-05-07 MED ORDER — SODIUM CHLORIDE 0.9 % IV SOLN: 250.0000 mL | INTRAVENOUS | Status: DC | PRN

## 2014-05-07 MED ORDER — ALBUTEROL (5 MG/ML) CONTINUOUS INHALATION SOLN
10.0000 mg/h | INHALATION_SOLUTION | Freq: Once | RESPIRATORY_TRACT | Status: AC
  Administered 2014-05-07: 10 mg/h via RESPIRATORY_TRACT
  Filled 2014-05-07: qty 40

## 2014-05-07 MED ORDER — INSULIN ASPART 100 UNIT/ML ~~LOC~~ SOLN
6.0000 [IU] | Freq: Three times a day (TID) | SUBCUTANEOUS | Status: DC
Start: 2014-05-08 — End: 2014-05-08
  Administered 2014-05-08 (×2): 6 [IU] via SUBCUTANEOUS

## 2014-05-07 MED ORDER — INSULIN GLARGINE 100 UNIT/ML ~~LOC~~ SOLN
25.0000 [IU] | Freq: Every day | SUBCUTANEOUS | Status: DC
Start: 2014-05-07 — End: 2014-05-08
  Administered 2014-05-07: 25 [IU] via SUBCUTANEOUS
  Filled 2014-05-07 (×2): qty 0.25

## 2014-05-07 MED ORDER — PAROXETINE HCL 20 MG PO TABS
40.0000 mg | ORAL_TABLET | Freq: Every day | ORAL | Status: DC
  Administered 2014-05-08: 40 mg via ORAL
  Filled 2014-05-07 (×2): qty 2

## 2014-05-07 MED ORDER — TERAZOSIN HCL 5 MG PO CAPS
10.0000 mg | ORAL_CAPSULE | Freq: Every day | ORAL | Status: DC
  Administered 2014-05-07: 10 mg via ORAL
  Filled 2014-05-07 (×2): qty 2

## 2014-05-07 MED ORDER — SODIUM CHLORIDE 0.9 % IJ SOLN: 3.0000 mL | INTRAMUSCULAR | Status: DC | PRN

## 2014-05-07 MED ORDER — DOXYCYCLINE HYCLATE 100 MG PO TABS
100.0000 mg | ORAL_TABLET | Freq: Two times a day (BID) | ORAL | Status: DC
  Administered 2014-05-07 – 2014-05-08 (×2): 100 mg via ORAL
  Filled 2014-05-07 (×3): qty 1

## 2014-05-07 MED ORDER — SODIUM CHLORIDE 0.9 % IJ SOLN
3.0000 mL | Freq: Two times a day (BID) | INTRAMUSCULAR | Status: DC
  Administered 2014-05-08: 3 mL via INTRAVENOUS

## 2014-05-07 MED ORDER — VITAMIN D3 25 MCG (1000 UNIT) PO TABS
1000.0000 [IU] | ORAL_TABLET | Freq: Every day | ORAL | Status: DC
  Administered 2014-05-08: 1000 [IU] via ORAL
  Filled 2014-05-07 (×2): qty 1

## 2014-05-07 MED ORDER — INSULIN ASPART 100 UNIT/ML ~~LOC~~ SOLN
0.0000 [IU] | Freq: Every day | SUBCUTANEOUS | Status: DC
  Administered 2014-05-07: 3 [IU] via SUBCUTANEOUS

## 2014-05-07 MED ORDER — IPRATROPIUM BROMIDE 0.02 % IN SOLN
0.5000 mg | Freq: Once | RESPIRATORY_TRACT | Status: AC
  Administered 2014-05-07: 0.5 mg via RESPIRATORY_TRACT
  Filled 2014-05-07: qty 2.5

## 2014-05-07 MED ORDER — CHLORTHALIDONE 25 MG PO TABS
12.5000 mg | ORAL_TABLET | Freq: Every day | ORAL | Status: DC
  Administered 2014-05-07 – 2014-05-08 (×2): 12.5 mg via ORAL
  Filled 2014-05-07 (×2): qty 0.5

## 2014-05-07 MED ORDER — IPRATROPIUM-ALBUTEROL 0.5-2.5 (3) MG/3ML IN SOLN
3.0000 mL | Freq: Four times a day (QID) | RESPIRATORY_TRACT | Status: DC
  Administered 2014-05-07: 3 mL via RESPIRATORY_TRACT
  Filled 2014-05-07: qty 3

## 2014-05-07 NOTE — ED Notes (Signed)
Patient transported to X-ray 

## 2014-05-07 NOTE — ED Provider Notes (Signed)
CSN: [id number]     Arrival date & time 05/07/14  1244 History   First MD Initiated Contact with Patient 05/07/14 1329     Chief Complaint  Patient presents with  . Cough     (Consider location/radiation/quality/duration/timing/severity/associated sxs/prior Treatment) HPI Comments: Patient presents with a four-day history of shortness of breath, cough and wheezing. States he felt ill after getting the flu shot 4 days ago. Fever to 102. Denies any chest pain, abdominal pain, nausea or vomiting. He is a smoker but denies any history of COPD or asthma. No leg pain or leg swelling. History for diabetes, hypertension, hyperlipidemia, PTSD.  The history is provided by the patient and a relative.    Past Medical History  Diagnosis Date  . PTSD (post-traumatic stress disorder)   . Hypertension   . Hyperlipidemia   . Type II diabetes mellitus   . CAP (community acquired pneumonia) 10/17/2013  . OSA on CPAP   . Migraines     "long time ago; none lately" (10/17/2013)   Past Surgical History  Procedure Laterality Date  . Joint replacement    . Total knee arthroplasty Right ~ 2012  . Total knee arthroplasty Left ~ 2013  . Excisional hemorrhoidectomy  1970's?   History reviewed. No pertinent family history. History  Substance Use Topics  . Smoking status: Current Every Day Smoker -- 1.00 packs/day for 50 years    Types: Cigarettes  . Smokeless tobacco: Never Used  . Alcohol Use: Yes     Comment: "used to drink a long time ago; none since the 1970's"    Review of Systems  Constitutional: Positive for fever, activity change and appetite change.  HENT: Positive for congestion and rhinorrhea.   Eyes: Negative for visual disturbance.  Respiratory: Positive for cough and shortness of breath.   Cardiovascular: Negative for chest pain.  Gastrointestinal: Negative for nausea, vomiting and abdominal pain.  Genitourinary: Negative for dysuria and hematuria.  Musculoskeletal: Negative for  myalgias, back pain and arthralgias.  Skin: Negative for rash.  Neurological: Negative for dizziness, weakness and headaches.  A complete 10 system review of systems was obtained and all systems are negative except as noted in the HPI and PMH.      Allergies  Review of patient's allergies indicates no known allergies.  Home Medications   Prior to Admission medications   Medication Sig Start Date End Date Taking? Authorizing Provider  aspirin EC 81 MG tablet Take 81 mg by mouth daily.   Yes Historical Provider, MD  atorvastatin (LIPITOR) 80 MG tablet Take 80 mg by mouth at bedtime.   Yes Historical Provider, MD  chlorthalidone (HYGROTON) 25 MG tablet Take 12.5 mg by mouth daily.   Yes Historical Provider, MD  cholecalciferol (VITAMIN D) 1000 UNITS tablet Take 1,000 Units by mouth daily.   Yes Historical Provider, MD  flunisolide (NASALIDE) 25 MCG/ACT (0.025%) SOLN Place 2 sprays into the nose 2 (two) times daily as needed (for sinuses).   Yes Historical Provider, MD  gabapentin (NEURONTIN) 300 MG capsule Take 300-900 mg by mouth 3 (three) times daily. Take 300 mg every morning and at 2 pm and take 900 mg at bedtime   Yes Historical Provider, MD  glipiZIDE (GLUCOTROL) 5 MG tablet Take by mouth 2 (two) times daily.   Yes Historical Provider, MD  HYDROcodone-acetaminophen (NORCO) 10-325 MG per tablet Take 1 tablet by mouth 4 (four) times daily as needed (for pain).   Yes Historical Provider, MD  insulin aspart (  NOVOLOG) 100 UNIT/ML injection Inject 6 Units into the skin 3 (three) times daily with meals. 10/20/13  Yes Marianne L York, PA-C  insulin glargine (LANTUS) 100 UNIT/ML injection Inject 0.25 mLs (25 Units total) into the skin at bedtime. 10/20/13  Yes Marianne L York, PA-C  metFORMIN (GLUCOPHAGE) 500 MG tablet Take 500 mg by mouth 3 (three) times daily.   Yes Historical Provider, MD  metoprolol (LOPRESSOR) 50 MG tablet Take 1 tablet (50 mg total) by mouth 2 (two) times daily. 10/20/13  Yes  Marianne L York, PA-C  naproxen (NAPROSYN) 375 MG tablet Take 375 mg by mouth 2 (two) times daily as needed (for knee pain).   Yes Historical Provider, MD  omeprazole (PRILOSEC) 20 MG capsule Take 20 mg by mouth 2 (two) times daily.   Yes Historical Provider, MD  PARoxetine (PAXIL) 40 MG tablet Take 40 mg by mouth daily.   Yes Historical Provider, MD  potassium chloride SA (K-DUR,KLOR-CON) 20 MEQ tablet Take 20 mEq by mouth daily.   Yes Historical Provider, MD  senna-docusate (SENOKOT-S) 8.6-50 MG per tablet Take 2 tablets by mouth at bedtime.   Yes Historical Provider, MD  temazepam (RESTORIL) 30 MG capsule Take 30 mg by mouth at bedtime as needed for sleep.   Yes Historical Provider, MD  terazosin (HYTRIN) 10 MG capsule Take 10 mg by mouth at bedtime.   Yes Historical Provider, MD   BP 144/72 mmHg  Pulse 91  Temp(Src) 98.2 F (36.8 C) (Oral)  Resp 19  Ht 5\' 11"  (1.803 m)  SpO2 94% Physical Exam  Constitutional: He is oriented to person, place, and time. He appears well-developed and well-nourished. No distress.  Speaking in full sentences  HENT:  Head: Normocephalic and atraumatic.  Mouth/Throat: Oropharynx is clear and moist. No oropharyngeal exudate.  Eyes: Conjunctivae and EOM are normal. Pupils are equal, round, and reactive to light.  Neck: Normal range of motion. Neck supple.  No meningismus.  Cardiovascular: Normal rate, regular rhythm, normal heart sounds and intact distal pulses.   No murmur heard. Pulmonary/Chest: Effort normal. No respiratory distress. He has wheezes.  Diffuse wheezing throughout  Abdominal: Soft. There is no tenderness. There is no rebound and no guarding.  Musculoskeletal: Normal range of motion. He exhibits no edema or tenderness.  Neurological: He is alert and oriented to person, place, and time. No cranial nerve deficit. He exhibits normal muscle tone. Coordination normal.  No ataxia on finger to nose bilaterally. No pronator drift. 5/5 strength  throughout. CN 2-12 intact. Negative Romberg. Equal grip strength. Sensation intact. Gait is normal.   Skin: Skin is warm.  Psychiatric: He has a normal mood and affect. His behavior is normal.  Nursing note and vitals reviewed.   ED Course  Procedures (including critical care time) Labs Review Labs Reviewed  BASIC METABOLIC PANEL - Abnormal; Notable for the following:    Potassium 3.6 (*)    Glucose, Bld 158 (*)    GFR calc non Af Amer 65 (*)    GFR calc Af Amer 75 (*)    All other components within normal limits  CBC - Abnormal; Notable for the following:    Hemoglobin 12.3 (*)    HCT 36.3 (*)    All other components within normal limits  GLUCOSE, CAPILLARY - Abnormal; Notable for the following:    Glucose-Capillary 181 (*)    All other components within normal limits  PRO B NATRIURETIC PEPTIDE  LIPID PANEL  CBC  BASIC METABOLIC  PANEL  TROPONIN I  TROPONIN I  TROPONIN I  HEMOGLOBIN A1C  I-STAT TROPOININ, ED    Imaging Review Dg Chest 2 View  05/07/2014   CLINICAL DATA:  Five day history of wheezing and difficulty breathing  EXAM: CHEST  2 VIEW  COMPARISON:  Oct 19, 2013  FINDINGS: There is underlying emphysematous change. There is underlying interstitial fibrosis. There is no frank edema or consolidation. The heart size is normal. Pulmonary vascularity reflects underlying emphysematous change. No adenopathy. There is mid thoracic dextroscoliosis and thoracolumbar levoscoliosis. There is slight anterior wedging of several mid thoracic vertebral bodies.  IMPRESSION: Underlying emphysematous change with interstitial fibrotic change as well. No edema or consolidation.   Electronically Signed   By: Bretta BangWilliam  Woodruff M.D.   On: 05/07/2014 14:29     EKG Interpretation   Date/Time:  Monday May 07 2014 13:01:00 EST Ventricular Rate:  76 PR Interval:  134 QRS Duration: 96 QT Interval:  448 QTC Calculation: 504 R Axis:   64 Text Interpretation:  Normal sinus rhythm  Moderate voltage criteria for  LVH, may be normal variant Cannot rule out Septal infarct , age  undetermined Prolonged QT Abnormal ECG No significant change was found  Confirmed by Manus GunningANCOUR  MD, Markelle Asaro [phone]([id number]) on 05/07/2014 2:10:47 PM      MDM   Final diagnoses:  COPD exacerbation   4 days of shortness of breath with wheezing, cough and fever. No chest pain.  EKG nonischemic.  Nebs, steroids, CXR. No infiltrate on CXR.  WOB and wheezing improved after duonebs and continuous nebs. Suspect COPD exacerbation though patient states never diagnosed with COPD.  Heavy smoking history.  Continues to wheeze with dyspnea. [facility] as patient is unassigned.   Glynn OctaveStephen Montrice Gracey, MD 05/07/14 2002

## 2014-05-07 NOTE — H&P (Signed)
Family Medicine Teaching [facility] Admission History and Physical Service Pager: [phone]  Patient name: [name] Parks Medical record number: [id number] Date of birth: June 24, 1945 Age: 68 y.o. Gender: male  Primary Care Provider: Caroline Sauger, MD Consultants: None Code Status: FUll  Chief Complaint: Shortness of breath  Assessment and Plan: [name] Parks is a 68 y.o. male presenting with shortness of breath. PMH is significant for current everyday smoker, HTN, HLD, T2DM, and PTSD.  Dyspnea 2/2 likely COPD exacerbation. Patient with no prior diagnosis of COPD, though presentation most consistent with COPD exacerbation (50+ pack-year history of smoking, emphysematous changes on CXR, significant wheezing on exam, and improvement with breathing treatments in ED). Low likelihood of PE (Wells Score 0) and CHF (no prior diagnosis, normal BNP, no LE edema, no orthopnea). Also consider chest pain per next problem. No evidence of PNA or pneumothorax on CXR. Patient satting in mid 90s on room air.  -Scheduled duonebs, albuterol prn -Doxycycline (12/14- ) -Oral prednisone (12/14- ) -Supplemental O2 as needed to keep sats >92% -Consider PFTs as outpatient once no longer in acute exacerbation for definitive diagnosis  Chest Pain. Heart score of 4 on admission. Chest pain reproducible on exam. Cardiac risk factors include age, HTN, HLD, and smoking history. Troponin normal in ED. EKG shows NSR and LVH with no acute ischemic changes - cycle troponins - f/u AM EKG - lipid panel in am, A1c for risk stratification - consider cards c/s in am  T2DM. A1c 10.0 in May. -f/u A1c -Continue home lantus 25U daily, novolog 6U tid with meals -SSI -Holding home metformin, glipizide  HTN.  -Continue home metoprolol, chlorthalidone, and aspirin  HLD.  Continue home statin -f/u lipid panel  Psych. Patient with history of PTSD -Continue home paxil, temazepam, and terazosin  Calf pain: worsens with  walking and patient has risk factors of HTN, HLD, DM, and smoking for PVD. Could also be related to neuropathic pain.  - continue gabapentin for possible neuropathic pain - may need LE vascular studies vs vascular f/u as an outpatient  Anemia: hgb 12.3. Has history of anemia. Low end of normal MCV.  -monitor on CBC in am -consider iron studies as outpatient  FEN/GI: Heart healthy diet, saline lock IV Prophylaxis: SubQ heparin  Disposition: Admitted under attending Dr Jennette Kettle pending above management.   History of Present Illness: [name] Parks is a 68 y.o. male presenting with shortness of breath and cough for 4 days.  Patient presented to the ED today due to dyspnea after receiving a flu shot at his PCP's office 4 days ago. Patient is a current everyday smoker since the age of [age] (less than a pack per day) but has never been diagnosed with COPD or emphysema. Cough is non productive. Also endorses wheezing and subjective fevers for the past 4 days. Patient has not tried anything that improved his shortness of breath.  Patient also endorses intermittent, left-sided chest pain. Described as a pressure-like sensation that only lasts for a few seconds. Does endorse becoming more short of breath and diaphoretic with these episodes of chest pain. Patient denies prior history of heart attack or heart disease.   Patient denies increased swelling in legs. No prior history of blood clots. Endorses some chronic pain in his calves worse with walking. No new pain. No recent long trips. No history of cancer. No hemoptysis.   No history of heart failure. No orthopnea. Sleeps on 2 pillows at home.  In the ED, patient was given 1 hour of  continuous albuterol therapy, duonebs, oral prednisone, and magnesium sulfate with some improvement in his symptoms. Pro-BNP and troponin were within normal limits. CXR showed underlying emphysematous changes with interstitial fibrotic changes with no edema or  consolidations. He maintained his oxygen saturations in the mid 90s on room air.   Review Of Systems: Per HPI, Otherwise 12 point review of systems was performed and was unremarkable.  Patient Active Problem List   Diagnosis Date Noted  . HCAP (healthcare-associated pneumonia) 10/17/2013  . COPD exacerbation 10/17/2013  . Leukocytosis 10/17/2013  . Nicotine abuse 10/17/2013  . Diabetes mellitus 10/17/2013  . Hypertension 10/17/2013  . Hyperlipidemia 10/17/2013   Past Medical History: Past Medical History  Diagnosis Date  . PTSD (post-traumatic stress disorder)   . Hypertension   . Hyperlipidemia   . Type II diabetes mellitus   . CAP (community acquired pneumonia) 10/17/2013  . OSA on CPAP   . Migraines     "long time ago; none lately" (10/17/2013)   Past Surgical History: Past Surgical History  Procedure Laterality Date  . Joint replacement    . Total knee arthroplasty Right ~ 2012  . Total knee arthroplasty Left ~ 2013  . Excisional hemorrhoidectomy  1970's?   Social History: History  Substance Use Topics  . Smoking status: Current Every Day Smoker -- 1.00 packs/day for 50 years    Types: Cigarettes  . Smokeless tobacco: Never Used  . Alcohol Use: Yes     Comment: "used to drink a long time ago; none since the 1970's"   Additional social history: N/A Please also refer to relevant sections of EMR.  Family History: History reviewed. No pertinent family history. Allergies and Medications: No Known Allergies No current facility-administered medications on file prior to encounter.   Current Outpatient Prescriptions on File Prior to Encounter  Medication Sig Dispense Refill  . aspirin EC 81 MG tablet Take 81 mg by mouth daily.    Marland Kitchen. atorvastatin (LIPITOR) 80 MG tablet Take 80 mg by mouth at bedtime.    . chlorthalidone (HYGROTON) 25 MG tablet Take 12.5 mg by mouth daily.    . cholecalciferol (VITAMIN D) 1000 UNITS tablet Take 1,000 Units by mouth daily.    .  flunisolide (NASALIDE) 25 MCG/ACT (0.025%) SOLN Place 2 sprays into the nose 2 (two) times daily as needed (for sinuses).    . gabapentin (NEURONTIN) 300 MG capsule Take 300-900 mg by mouth 3 (three) times daily. Take 300 mg every morning and at 2 pm and take 900 mg at bedtime    . glipiZIDE (GLUCOTROL) 5 MG tablet Take by mouth 2 (two) times daily.    Marland Kitchen. HYDROcodone-acetaminophen (NORCO) 10-325 MG per tablet Take 1 tablet by mouth 4 (four) times daily as needed (for pain).    . insulin aspart (NOVOLOG) 100 UNIT/ML injection Inject 6 Units into the skin 3 (three) times daily with meals. 10 mL 11  . insulin glargine (LANTUS) 100 UNIT/ML injection Inject 0.25 mLs (25 Units total) into the skin at bedtime. 10 mL 11  . metFORMIN (GLUCOPHAGE) 500 MG tablet Take 500 mg by mouth 3 (three) times daily.    . metoprolol (LOPRESSOR) 50 MG tablet Take 1 tablet (50 mg total) by mouth 2 (two) times daily. 60 tablet 0  . naproxen (NAPROSYN) 375 MG tablet Take 375 mg by mouth 2 (two) times daily as needed (for knee pain).    Marland Kitchen. omeprazole (PRILOSEC) 20 MG capsule Take 20 mg by mouth 2 (two) times daily.    .Marland Kitchen  PARoxetine (PAXIL) 40 MG tablet Take 40 mg by mouth daily.    . potassium chloride SA (K-DUR,KLOR-CON) 20 MEQ tablet Take 20 mEq by mouth daily.    Marland Kitchen. senna-docusate (SENOKOT-S) 8.6-50 MG per tablet Take 2 tablets by mouth at bedtime.    . temazepam (RESTORIL) 30 MG capsule Take 30 mg by mouth at bedtime as needed for sleep.    Marland Kitchen. terazosin (HYTRIN) 10 MG capsule Take 10 mg by mouth at bedtime.      Objective: BP 137/84 mmHg  Pulse 77  Temp(Src) 98.2 F (36.8 C) (Oral)  Resp 22  SpO2 100% Exam: General: Patient in NAD, lying in hospital bed speaking in full sentences HEENT: PERRL, MMM Cardiovascular: Distant heart sounds, RRR, no murmurs appreciated, chest wall tender to palpation along inferior edge of sternum Respiratory: Minimally increased work of breathing, diffuse wheezes in all lung fields, no  rales Abdomen: +Bs, soft, NT, ND Extremities: Clubbing noticed in nailbeds on hands. No cyanosis Skin: No rashes Neuro: Alert and oriented. No focal neurological deficits.   Labs and Imaging: CBC BMET   Recent Labs Lab 05/07/14 1308  WBC 5.6  HGB 12.3*  HCT 36.3*  PLT 245    Recent Labs Lab 05/07/14 1308  NA 140  K 3.6*  CL 98  CO2 27  BUN 12  CREATININE 1.13  GLUCOSE 158*  CALCIUM 9.9     Troponin 0.01 Pro-BNP 123.9 EKG: NSR, LVH, long QTc (503)  Dg Chest 2 View  05/07/2014   CLINICAL DATA:  Five day history of wheezing and difficulty breathing  EXAM: CHEST  2 VIEW  COMPARISON:  Oct 19, 2013  FINDINGS: There is underlying emphysematous change. There is underlying interstitial fibrosis. There is no frank edema or consolidation. The heart size is normal. Pulmonary vascularity reflects underlying emphysematous change. No adenopathy. There is mid thoracic dextroscoliosis and thoracolumbar levoscoliosis. There is slight anterior wedging of several mid thoracic vertebral bodies.  IMPRESSION: Underlying emphysematous change with interstitial fibrotic change as well. No edema or consolidation.   Electronically Signed   By: Bretta BangWilliam  Woodruff M.D.   On: 05/07/2014 14:29   Jacquiline Doealeb Parker, MD 05/07/2014, 4:23 PM PGY-1, [facility] FPTS Intern pager: [phone], text pages welcome  Upper Level Addendum:  I have seen and evaluated this patient along with Dr. Jimmey RalphParker and reviewed the above note, making necessary revisions in red.   Marikay AlarEric Clide Remmers, MD Family Medicine PGY-2

## 2014-05-07 NOTE — ED Notes (Signed)
Pt returned from XRAY and placed back on cardiac monitor 

## 2014-05-07 NOTE — ED Notes (Signed)
Attempted to call report, no answer on floor.

## 2014-05-07 NOTE — ED Notes (Signed)
Pt ambulated in hallway while on Pulse Oximetry. Pt O2 sats ranged between 93%-91% with obviously signs of labored breathing throughout duration of walking.

## 2014-05-07 NOTE — ED Notes (Signed)
He had cold symptoms last week, got the flu shot Thursday, then Thursday night began to have fever, dry cough, wheezing, sob that has persisted since.

## 2014-05-08 DIAGNOSIS — R0602 Shortness of breath: Secondary | ICD-10-CM | POA: Insufficient documentation

## 2014-05-08 LAB — GLUCOSE, CAPILLARY
GLUCOSE-CAPILLARY: 115 mg/dL — AB (ref 70–99)
Glucose-Capillary: 224 mg/dL — ABNORMAL HIGH (ref 70–99)

## 2014-05-08 LAB — BASIC METABOLIC PANEL
ANION GAP: 16 — AB (ref 5–15)
BUN: 17 mg/dL (ref 6–23)
CHLORIDE: 94 meq/L — AB (ref 96–112)
CO2: 24 mEq/L (ref 19–32)
Calcium: 9.4 mg/dL (ref 8.4–10.5)
Creatinine, Ser: 1.02 mg/dL (ref 0.50–1.35)
GFR, EST AFRICAN AMERICAN: 85 mL/min — AB (ref >90)
GFR, EST NON AFRICAN AMERICAN: 73 mL/min — AB (ref >90)
Glucose, Bld: 354 mg/dL — ABNORMAL HIGH (ref 70–99)
Potassium: 3.9 mEq/L (ref 3.7–5.3)
Sodium: 134 mEq/L — ABNORMAL LOW (ref 137–147)

## 2014-05-08 LAB — CBC
HCT: 31.4 % — ABNORMAL LOW (ref 39.0–52.0)
Hemoglobin: 10.5 g/dL — ABNORMAL LOW (ref 13.0–17.0)
MCH: 27.1 pg (ref 26.0–34.0)
MCHC: 33.4 g/dL (ref 30.0–36.0)
MCV: 80.9 fL (ref 78.0–100.0)
Platelets: 229 10*3/uL (ref 150–400)
RBC: 3.88 MIL/uL — ABNORMAL LOW (ref 4.22–5.81)
RDW: 14.9 % (ref 11.5–15.5)
WBC: 4.4 10*3/uL (ref 4.0–10.5)

## 2014-05-08 LAB — TROPONIN I
Troponin I: <0.3 ng/mL (ref <0.30)
Troponin I: <0.3 ng/mL (ref <0.30)

## 2014-05-08 LAB — LIPID PANEL
Cholesterol: 134 mg/dL (ref 0–200)
HDL: 36 mg/dL — AB (ref >39)
LDL Cholesterol: 80 mg/dL (ref 0–99)
TRIGLYCERIDES: 91 mg/dL (ref <150)
Total CHOL/HDL Ratio: 3.7 RATIO
VLDL: 18 mg/dL (ref 0–40)

## 2014-05-08 LAB — HEMOGLOBIN A1C
Hgb A1c MFr Bld: 8.7 % — ABNORMAL HIGH (ref <5.7)
Mean Plasma Glucose: 203 mg/dL — ABNORMAL HIGH (ref <117)

## 2014-05-08 MED ORDER — DOXYCYCLINE HYCLATE 100 MG PO TABS: 100.0000 mg | ORAL_TABLET | Freq: Two times a day (BID) | ORAL | Status: AC

## 2014-05-08 MED ORDER — ALBUTEROL SULFATE HFA 108 (90 BASE) MCG/ACT IN AERS: 2.0000 | INHALATION_SPRAY | RESPIRATORY_TRACT | Status: AC | PRN

## 2014-05-08 MED ORDER — PREDNISONE 50 MG PO TABS: 50.0000 mg | ORAL_TABLET | Freq: Every day | ORAL | Status: AC

## 2014-05-08 NOTE — Plan of Care (Signed)
Problem: Food- and Nutrition-Related Knowledge Deficit (NB-1.1) Goal: Nutrition education Formal process to instruct or train a patient/client in a skill or to impart knowledge to help patients/clients voluntarily manage or modify food choices and eating behavior to maintain or improve health. Outcome: Completed/Met Date Met:  05/08/14  RD consulted for nutrition education regarding diabetes.     Lab Results  Component Value Date    HGBA1C 8.7* 05/07/2014    RD provided "Carbohydrate Counting for People with Diabetes" handout from the Academy of [employer] and Dietetics. Discussed different food groups and their effects on blood sugar, emphasizing carbohydrate-containing foods. Provided list of carbohydrates and recommended serving sizes of common foods.  Discussed importance of controlled and consistent carbohydrate intake throughout the day and the importance of eating at least every 4-5 hours. Provided examples of ways to balance meals/snacks and encouraged intake of high-fiber, whole grain complex carbohydrates. Discussed diabetic friendly drink options. Teach back method used.  Expect good compliance.  Body mass index is 27.21 kg/(m^2). Pt meets criteria for overweight based on current BMI.  Current diet order is heart healthy/carbohydrate modified, patient is consuming approximately 100% of meals at this time. Labs and medications reviewed. No further nutrition interventions warranted at this time. RD contact information provided. If additional nutrition issues arise, please re-consult RD.  Kallie Locks, MS, RD, LDN Pager # [phone] After hours/ weekend pager # [phone]

## 2014-05-08 NOTE — Discharge Instructions (Signed)
You were admitted to the hospital with a COPD exacerbation. While here, you received antibiotics, steroids, and breathing treatments which helped your breathing. It is important that you continue to take your medications as prescribed and that you follow up with your PCP.  Chronic Obstructive Pulmonary Disease Chronic obstructive pulmonary disease (COPD) is a common lung condition in which airflow from the lungs is limited. COPD is a general term that can be used to describe many different lung problems that limit airflow, including both chronic bronchitis and emphysema. If you have COPD, your lung function will probably never return to normal, but there are measures you can take to improve lung function and make yourself feel better.  CAUSES   Smoking (common).   Exposure to secondhand smoke.   Genetic problems.  Chronic inflammatory lung diseases or recurrent infections. SYMPTOMS   Shortness of breath, especially with physical activity.   Deep, persistent (chronic) cough with a large amount of thick mucus.   Wheezing.   Rapid breaths (tachypnea).   Gray or bluish discoloration (cyanosis) of the skin, especially in fingers, toes, or lips.   Fatigue.   Weight loss.   Frequent infections or episodes when breathing symptoms become much worse (exacerbations).   Chest tightness. DIAGNOSIS  Your health care provider will take a medical history and perform a physical examination to make the initial diagnosis. Additional tests for COPD may include:   Lung (pulmonary) function tests.  Chest X-ray.  CT scan.  Blood tests. TREATMENT  Treatment available to help you feel better when you have COPD includes:   Inhaler and nebulizer medicines. These help manage the symptoms of COPD and make your breathing more comfortable.  Supplemental oxygen. Supplemental oxygen is only helpful if you have a low oxygen level in your blood.   Exercise and physical activity. These are  beneficial for nearly all people with COPD. Some people may also benefit from a pulmonary rehabilitation program. HOME CARE INSTRUCTIONS   Take all medicines (inhaled or pills) as directed by your health care provider.  Avoid over-the-counter medicines or cough syrups that dry up your airway (such as antihistamines) and slow down the elimination of secretions unless instructed otherwise by your health care provider.   If you are a smoker, the most important thing that you can do is stop smoking. Continuing to smoke will cause further lung damage and breathing trouble. Ask your health care provider for help with quitting smoking. He or she can direct you to community resources or hospitals that provide support.  Avoid exposure to irritants such as smoke, chemicals, and fumes that aggravate your breathing.  Use oxygen therapy and pulmonary rehabilitation if directed by your health care provider. If you require home oxygen therapy, ask your health care provider whether you should purchase a pulse oximeter to measure your oxygen level at home.   Avoid contact with individuals who have a contagious illness.  Avoid extreme temperature and humidity changes.  Eat healthy foods. Eating smaller, more frequent meals and resting before meals may help you maintain your strength.  Stay active, but balance activity with periods of rest. Exercise and physical activity will help you maintain your ability to do things you want to do.  Preventing infection and hospitalization is very important when you have COPD. Make sure to receive all the vaccines your health care provider recommends, especially the pneumococcal and influenza vaccines. Ask your health care provider whether you need a pneumonia vaccine.  Learn and use relaxation techniques to  manage stress.  Learn and use controlled breathing techniques as directed by your health care provider. Controlled breathing techniques include:   Pursed lip  breathing. Start by breathing in (inhaling) through your nose for 1 second. Then, purse your lips as if you were going to whistle and breathe out (exhale) through the pursed lips for 2 seconds.   Diaphragmatic breathing. Start by putting one hand on your abdomen just above your waist. Inhale slowly through your nose. The hand on your abdomen should move out. Then purse your lips and exhale slowly. You should be able to feel the hand on your abdomen moving in as you exhale.   Learn and use controlled coughing to clear mucus from your lungs. Controlled coughing is a series of short, progressive coughs. The steps of controlled coughing are:  1. Lean your head slightly forward.  2. Breathe in deeply using diaphragmatic breathing.  3. Try to hold your breath for 3 seconds.  4. Keep your mouth slightly open while coughing twice.  5. Spit any mucus out into a tissue.  6. Rest and repeat the steps once or twice as needed. SEEK MEDICAL CARE IF:   You are coughing up more mucus than usual.   There is a change in the color or thickness of your mucus.   Your breathing is more labored than usual.   Your breathing is faster than usual.  SEEK IMMEDIATE MEDICAL CARE IF:   You have shortness of breath while you are resting.   You have shortness of breath that prevents you from:  Being able to talk.   Performing your usual physical activities.   You have chest pain lasting longer than 5 minutes.   Your skin color is more cyanotic than usual.  You measure low oxygen saturations for longer than 5 minutes with a pulse oximeter. MAKE SURE YOU:   Understand these instructions.  Will watch your condition.  Will get help right away if you are not doing well or get worse. Document Released: 02/18/2005 Document Revised: 09/25/2013 Document Reviewed: 01/05/2013 [facility] Patient Information 2015 [place], [place]. This information is not intended to replace advice given to you by your  health care provider. Make sure you discuss any questions you have with your health care provider.

## 2014-05-08 NOTE — Progress Notes (Signed)
Family Medicine Teaching Service Daily Progress Note Intern Pager: [phone]  Patient name: [name] Parks Medical record number: [id number] Date of birth: 02/04/1946 Age: 68 y.o. Gender: male  Primary Care Provider: Caroline SaugerEID,INDIA, MD Consultants: None Code Status: Full  Pt Overview and Major Events to Date:  12/14 - Admitted with likely COPD exacerbation  Assessment and Plan: [name] Parks is a 68 y.o. male presenting with shortness of breath. PMH is significant for current everyday smoker, HTN, HLD, T2DM, and PTSD.  Dyspnea 2/2 likely COPD exacerbation. Patient with no prior diagnosis of COPD, though presentation most consistent with COPD exacerbation (50+ pack-year history of smoking, emphysematous changes on CXR, significant wheezing on exam, and improvement with breathing treatments in ED). Low likelihood of PE (Wells Score 0) and CHF (no prior diagnosis, normal BNP, no LE edema, no orthopnea). Also consider chest pain per next problem. No evidence of PNA or pneumothorax on CXR. Patient satting in mid 90s on room air on admission -Scheduled duonebs, albuterol prn -Doxycycline (12/14- ) -Oral prednisone (12/14- ) -Supplemental O2 as needed to keep sats >92%, wean as tolerated -Consider PFTs as outpatient once no longer in acute exacerbation for definitive diagnosis  Chest Pain. Heart score of 4 on admission. Chest pain reproducible on exam. Cardiac risk factors include age, HTN, HLD, and smoking history. Troponin normal in ED. EKG shows NSR and LVH with no acute ischemic changes - troponin negative x3 - A1c 8.7 here  T2DM. A1c 10.0 in May. 8.7 here.  -Continue home lantus 25U daily, novolog 6U tid with meals -SSI -Holding home metformin, glipizide  HTN.  -Continue home metoprolol, chlorthalidone, and aspirin  HLD. Cholesterol 134, HDL 36, LDL 80, TG 91 - Continue home atorvastatin  Psych. Patient with history of PTSD -Continue home paxil, temazepam, and terazosin  Calf  pain: worsens with walking and patient has risk factors of HTN, HLD, DM, and smoking for PVD. Could also be related to neuropathic pain.  - continue gabapentin for possible neuropathic pain - may need LE vascular studies vs vascular f/u as an outpatient  Anemia: hgb 12.3. Has history of anemia. Low end of normal MCV.  -Monitor CBCs -consider iron studies as outpatient  FEN/GI: Heart healthy diet, saline lock IV Prophylaxis: SubQ heparin  Disposition: Admitted pending above management.  Subjective:  Breathing better this morning. No fevers or chills. No complaints this morning.   Objective: Temp:  [97.8 F (36.6 C)-98.9 F (37.2 C)] 97.8 F (36.6 C) (12/15 0457) Pulse Rate:  [68-94] 68 (12/15 0457) Resp:  [16-22] 19 (12/15 0457) BP: (96-154)/(53-84) 120/66 mmHg (12/15 0457) SpO2:  [91 %-100 %] 97 % (12/15 0457) Weight:  [195 lb (88.451 kg)] 195 lb (88.451 kg) (12/14 2056) Physical Exam: General: Patient in NAD, lying in hospital bed speaking in full sentences Cardiovascular: Distant heart sounds, RRR, no murmurs appreciated Respiratory: Minimi ally increased work of breathing, diffuse wheezes Abdomen: +BS, soft, NT, ND Extremities: Bilateral clubbing. No cyanosis.   Laboratory:  Recent Labs Lab 05/07/14 1308 05/08/14 0040  WBC 5.6 4.4  HGB 12.3* 10.5*  HCT 36.3* 31.4*  PLT 245 229    Recent Labs Lab 05/07/14 1308 05/08/14 0040  NA 140 134*  K 3.6* 3.9  CL 98 94*  CO2 27 24  BUN 12 17  CREATININE 1.13 1.02  CALCIUM 9.9 9.4  GLUCOSE 158* 354*    Troponin negative x3 Pro-BNP 123.9 EKG: NSR, LVH, long QTc (503)   Imaging/Diagnostic Tests: None new  Jacquiline Doealeb Prescilla Monger, MD  05/08/2014, 8:59 AM PGY-1, [facility] FPTS Intern pager: [phone], text pages welcome

## 2014-05-08 NOTE — Discharge Summary (Signed)
[facility]  Patient name: [name] MatarLarry Parks Medical record number: [id number] Date of birth: [birth year] Age: 68 y.o. Gender: male Date of Admission: 05/07/2014  Date of Discharge:05/09/2014 Admitting Physician: Nestor RampSara L Neal, MD  Primary Care Provider: Caroline SaugerEID,INDIA, MD Consultants: None  Indication for Hospitalization: COPD exacerbation  Discharge Diagnoses/Problem List:  Dyspnea 2/2 COPD exacerbation, chest pain, T2DM, HTN, HLD, PTSD, chronic anemia  Disposition: Home  Discharge Condition: Improved  Discharge Exam: Please see progress note for day of dischage  Brief Hospital Course:  [name] MatarLarry Mcewan is a 68 y.o. male who presented with shortness of breath. PMH is significant for current everyday smoker, HTN, HLD, T2DM, and PTSD.  His hospital course, by problem, is outlined below:  Dyspnea 2/2 likely COPD exacerbation. Patient presented with dyspnea, thought to be most consistent with COPD exacerbation (50+ pack-year history of smoking, emphysematous changes on CXR, significant wheezing on exam, and improvement with breathing treatments in ED). However, patient has no prior diagnosis of COPD. Other less likely etiologies included  PE (Wells Score 0) and CHF (no prior diagnosis, normal BNP, no LE edema, no orthopnea). Patient was satting in the mid 90s on room air on admission. He was started on scheduled duonebs, oral prednisone, and doxycycline. He experienced no complications during his stay. His symptoms improved with these interventions and he was discharged home to finish his course of steroids and doxycycline.   Musculoskeletal chest pain. Patient also reported chest pain on admission. Patient's heart score was 4 on admission with risk factors including HTN, HLD, and current smoker. He had a negative ACS work up including negative troponin x3 and negative EKG. The most likely etiology for the patient's pain was musculoskeletal chest pain  given that his pain was reproducible on exam.   The rest of the patient's chronic medical conditions, including T2DM, HTN, HLD, anemia, and PTSD were stable during this admission and the patient was discharged on his home medications for these conditions.   Issues for Follow Up:  1) f/u dyspnea - consider PFTs when no longer in acute exacerbation for formal diagnosis and staging of COPD 2) Consider initiation of controller medication based on patient's staging or if he continues to have frequent exacerbations 3) Patient found to have anemia with low range of normal MCV here - consider iron studies as an outpatient 4) Patient also complained of calf pain worse with walking here - Likely related to patient's neuropathic pain, but can consider LE vascular studies as an outpatient.    Significant Procedures: None  Significant Labs and Imaging:   Recent Labs Lab 05/07/14 1308 05/08/14 0040  WBC 5.6 4.4  HGB 12.3* 10.5*  HCT 36.3* 31.4*  PLT 245 229    Recent Labs Lab 05/07/14 1308 05/08/14 0040  NA 140 134*  K 3.6* 3.9  CL 98 94*  CO2 27 24  GLUCOSE 158* 354*  BUN 12 17  CREATININE 1.13 1.02  CALCIUM 9.9 9.4   Troponin negative x3 Pro-BNP 123.9 EKG: NSR, LVH, long QTc (503)  Results/Tests Pending at Time of Discharge: None  Discharge Medications:    Medication List    TAKE these medications        albuterol 108 (90 BASE) MCG/ACT inhaler  Commonly known as:  PROVENTIL HFA;VENTOLIN HFA  Inhale 2 puffs into the lungs every 4 (four) hours as needed for wheezing or shortness of breath.     aspirin EC 81 MG tablet  Take 81 mg by mouth daily.  atorvastatin 80 MG tablet  Commonly known as:  LIPITOR  Take 80 mg by mouth at bedtime.     chlorthalidone 25 MG tablet  Commonly known as:  HYGROTON  Take 12.5 mg by mouth daily.     cholecalciferol 1000 UNITS tablet  Commonly known as:  VITAMIN D  Take 1,000 Units by mouth daily.     doxycycline 100 MG tablet   Commonly known as:  VIBRA-TABS  Take 1 tablet (100 mg total) by mouth every 12 (twelve) hours.     flunisolide 25 MCG/ACT (0.025%) Soln  Commonly known as:  NASALIDE  Place 2 sprays into the nose 2 (two) times daily as needed (for sinuses).     gabapentin 300 MG capsule  Commonly known as:  NEURONTIN  Take 300-900 mg by mouth 3 (three) times daily. Take 300 mg every morning and at 2 pm and take 900 mg at bedtime     glipiZIDE 5 MG tablet  Commonly known as:  GLUCOTROL  Take by mouth 2 (two) times daily.     HYDROcodone-acetaminophen 10-325 MG per tablet  Commonly known as:  NORCO  Take 1 tablet by mouth 4 (four) times daily as needed (for pain).     insulin aspart 100 UNIT/ML injection  Commonly known as:  novoLOG  Inject 6 Units into the skin 3 (three) times daily with meals.     insulin glargine 100 UNIT/ML injection  Commonly known as:  LANTUS  Inject 0.25 mLs (25 Units total) into the skin at bedtime.     metFORMIN 500 MG tablet  Commonly known as:  GLUCOPHAGE  Take 500 mg by mouth 3 (three) times daily.     metoprolol 50 MG tablet  Commonly known as:  LOPRESSOR  Take 1 tablet (50 mg total) by mouth 2 (two) times daily.     naproxen 375 MG tablet  Commonly known as:  NAPROSYN  Take 375 mg by mouth 2 (two) times daily as needed (for knee pain).     omeprazole 20 MG capsule  Commonly known as:  PRILOSEC  Take 20 mg by mouth 2 (two) times daily.     PARoxetine 40 MG tablet  Commonly known as:  PAXIL  Take 40 mg by mouth daily.     potassium chloride SA 20 MEQ tablet  Commonly known as:  K-DUR,KLOR-CON  Take 20 mEq by mouth daily.     predniSONE 50 MG tablet  Commonly known as:  DELTASONE  Take 1 tablet (50 mg total) by mouth daily with breakfast.     senna-docusate 8.6-50 MG per tablet  Commonly known as:  Senokot-S  Take 2 tablets by mouth at bedtime.     temazepam 30 MG capsule  Commonly known as:  RESTORIL  Take 30 mg by mouth at bedtime as needed  for sleep.     terazosin 10 MG capsule  Commonly known as:  HYTRIN  Take 10 mg by mouth at bedtime.        Discharge Instructions: Please refer to Patient Instructions section of EMR for full details.  Patient was counseled important signs and symptoms that should prompt return to medical care, changes in medications, dietary instructions, activity restrictions, and follow up appointments.   Follow-Up Appointments: Follow-up Information    Follow up with [name],INDIA, MD.   Specialty:  Internal Medicine   Contact information:   [address] [place] [address] [phone]       Jacquiline Doealeb Parker, MD 05/09/2014,  5:13 PM PGY-1, [facility]

## 2014-05-08 NOTE — Progress Notes (Signed)
Spoke with patient and his wife about diabetes and home regimen for diabetes control. Patient reports that he is followed by his PCP for diabetes management and currently he takes Lantus 25 units QHS, Novolog 6 units TID with meals (if glucose if over 200 mg/dl), and Metformin 147500 mg BID (with breakfast and supper) as an outpatient for diabetes control.  Noted patient also has Glipizide 5 mg BID on home medication list; patient reports that his doctor took him off the Glipizide about 6 months ago when he was started on Novolog 6 units TID with meals. Patient reports that he checks his glucose 2-3 times per day most days but admits that he sometimes does not check his glucose as often as he needs to. According to the patient his glucose is generally less than 200 mg/dl and he reports that he does not have any issues with hypoglycemia.  Inquired about knowledge about A1C and patient reports that he does not know exactly what an A1C is but he knows that his doctor checks an A1C.  Discussed A1C results (8.7% on 05/07/14) and explained what an A1C is, basic pathophysiology of DM Type 2, basic home care, importance of checking CBGs and maintaining good CBG control to prevent long-term and short-term complications. Discussed what actual A1C values mean in relation to a blood glucose value and provided handout information (Know your numbers).  Discussed impact of nutrition, exercise, stress, sickness, and medications on diabetes control. Patient reports that he does not really follow any particular diet.  Discussed carbohydrates, carbohydrate goals per day and meal, along with portion sizes. Patient reports that he drinks a lot of [employer] and water. Encouraged patient to eliminate sugar from the fluids he drinks and to try sugar free kool aid sweetened with artificial sweeteners.  Patient requested more information on a carbohydrate modified diet. Placed consult for RD. Patient also reports that he has been worried about  his wife as she has had recent dx with cancer and has had 2 surgeries over the last few months and has another surgery planned for January. Encouraged patient to be consistent about checking his glucose 3-4 times per day and to take his glucose readings with him to follow up visits so his doctor can use the information to make changes to his medications and improve diabetes control.  Patient verbalized understanding of information discussed and he states that he has no further questions at this time related to diabetes.   Thanks, Orlando PennerMarie Kateleen Encarnacion, RN, MSN, CCRN Diabetes Coordinator Inpatient Diabetes Program [phone] (Team Pager) [phone] ([facility] office) [phone] [facility] office)

## 2014-08-28 ENCOUNTER — Emergency Department (HOSPITAL_COMMUNITY): Payer: Medicare Other

## 2014-08-28 ENCOUNTER — Encounter (HOSPITAL_COMMUNITY): Payer: Self-pay | Admitting: Emergency Medicine

## 2014-08-28 ENCOUNTER — Inpatient Hospital Stay (HOSPITAL_COMMUNITY)
Admission: EM | Admit: 2014-08-28 | Discharge: 2014-08-31 | DRG: 392 | Disposition: A | Discharge status: Home / Self Care | Payer: Medicare Other | Attending: Internal Medicine | Admitting: Internal Medicine

## 2014-08-28 DIAGNOSIS — K219 Gastro-esophageal reflux disease without esophagitis: Secondary | ICD-10-CM | POA: Diagnosis present

## 2014-08-28 DIAGNOSIS — E785 Hyperlipidemia, unspecified: Secondary | ICD-10-CM | POA: Diagnosis present

## 2014-08-28 DIAGNOSIS — R778 Other specified abnormalities of plasma proteins: Secondary | ICD-10-CM | POA: Diagnosis present

## 2014-08-28 DIAGNOSIS — J441 Chronic obstructive pulmonary disease with (acute) exacerbation: Secondary | ICD-10-CM

## 2014-08-28 DIAGNOSIS — I10 Essential (primary) hypertension: Secondary | ICD-10-CM | POA: Diagnosis present

## 2014-08-28 DIAGNOSIS — G43909 Migraine, unspecified, not intractable, without status migrainosus: Secondary | ICD-10-CM | POA: Diagnosis present

## 2014-08-28 DIAGNOSIS — Z96653 Presence of artificial knee joint, bilateral: Secondary | ICD-10-CM | POA: Diagnosis present

## 2014-08-28 DIAGNOSIS — F4310 Post-traumatic stress disorder, unspecified: Secondary | ICD-10-CM | POA: Diagnosis present

## 2014-08-28 DIAGNOSIS — J449 Chronic obstructive pulmonary disease, unspecified: Secondary | ICD-10-CM | POA: Diagnosis not present

## 2014-08-28 DIAGNOSIS — R0789 Other chest pain: Secondary | ICD-10-CM | POA: Insufficient documentation

## 2014-08-28 DIAGNOSIS — R112 Nausea with vomiting, unspecified: Secondary | ICD-10-CM | POA: Diagnosis not present

## 2014-08-28 DIAGNOSIS — R5383 Other fatigue: Secondary | ICD-10-CM

## 2014-08-28 DIAGNOSIS — R197 Diarrhea, unspecified: Secondary | ICD-10-CM | POA: Diagnosis not present

## 2014-08-28 DIAGNOSIS — R7989 Other specified abnormal findings of blood chemistry: Secondary | ICD-10-CM

## 2014-08-28 DIAGNOSIS — A084 Viral intestinal infection, unspecified: Secondary | ICD-10-CM | POA: Diagnosis not present

## 2014-08-28 DIAGNOSIS — Z888 Allergy status to other drugs, medicaments and biological substances status: Secondary | ICD-10-CM

## 2014-08-28 DIAGNOSIS — G4733 Obstructive sleep apnea (adult) (pediatric): Secondary | ICD-10-CM | POA: Diagnosis present

## 2014-08-28 DIAGNOSIS — F17210 Nicotine dependence, cigarettes, uncomplicated: Secondary | ICD-10-CM | POA: Diagnosis present

## 2014-08-28 DIAGNOSIS — R55 Syncope and collapse: Secondary | ICD-10-CM | POA: Diagnosis present

## 2014-08-28 DIAGNOSIS — Z8701 Personal history of pneumonia (recurrent): Secondary | ICD-10-CM

## 2014-08-28 DIAGNOSIS — Z720 Tobacco use: Secondary | ICD-10-CM | POA: Diagnosis present

## 2014-08-28 DIAGNOSIS — Z7982 Long term (current) use of aspirin: Secondary | ICD-10-CM

## 2014-08-28 DIAGNOSIS — Z794 Long term (current) use of insulin: Secondary | ICD-10-CM

## 2014-08-28 DIAGNOSIS — E876 Hypokalemia: Secondary | ICD-10-CM | POA: Diagnosis present

## 2014-08-28 DIAGNOSIS — E860 Dehydration: Secondary | ICD-10-CM | POA: Diagnosis present

## 2014-08-28 DIAGNOSIS — E119 Type 2 diabetes mellitus without complications: Secondary | ICD-10-CM | POA: Diagnosis present

## 2014-08-28 HISTORY — DX: Reserved for inherently not codable concepts without codable children: IMO0001

## 2014-08-28 HISTORY — DX: Chronic obstructive pulmonary disease, unspecified: J44.9

## 2014-08-28 LAB — URINALYSIS, ROUTINE W REFLEX MICROSCOPIC
Bilirubin Urine: NEGATIVE
Glucose, UA: NEGATIVE mg/dL
Hgb urine dipstick: NEGATIVE
Ketones, ur: NEGATIVE mg/dL
LEUKOCYTES UA: NEGATIVE
NITRITE: NEGATIVE
PH: 5.5 (ref 5.0–8.0)
Protein, ur: NEGATIVE mg/dL
SPECIFIC GRAVITY, URINE: 1.018 (ref 1.005–1.030)
Urobilinogen, UA: 0.2 mg/dL (ref 0.0–1.0)

## 2014-08-28 LAB — COMPREHENSIVE METABOLIC PANEL
ALK PHOS: 80 U/L (ref 39–117)
ALT: 15 U/L (ref 0–53)
AST: 25 U/L (ref 0–37)
Albumin: 3.4 g/dL — ABNORMAL LOW (ref 3.5–5.2)
Anion gap: 11 (ref 5–15)
BUN: 11 mg/dL (ref 6–23)
CO2: 25 mmol/L (ref 19–32)
Calcium: 9.3 mg/dL (ref 8.4–10.5)
Chloride: 106 mmol/L (ref 96–112)
Creatinine, Ser: 1.09 mg/dL (ref 0.50–1.35)
GFR, EST AFRICAN AMERICAN: 78 mL/min — AB (ref >90)
GFR, EST NON AFRICAN AMERICAN: 67 mL/min — AB (ref >90)
GLUCOSE: 89 mg/dL (ref 70–99)
POTASSIUM: 3.7 mmol/L (ref 3.5–5.1)
SODIUM: 142 mmol/L (ref 135–145)
Total Bilirubin: 0.5 mg/dL (ref 0.3–1.2)
Total Protein: 6.3 g/dL (ref 6.0–8.3)

## 2014-08-28 LAB — CBC WITH DIFFERENTIAL/PLATELET
BASOS PCT: 1 % (ref 0–1)
Basophils Absolute: 0.1 10*3/uL (ref 0.0–0.1)
EOS ABS: 0.3 10*3/uL (ref 0.0–0.7)
Eosinophils Relative: 3 % (ref 0–5)
HCT: 33.6 % — ABNORMAL LOW (ref 39.0–52.0)
Hemoglobin: 11.2 g/dL — ABNORMAL LOW (ref 13.0–17.0)
Lymphocytes Relative: 22 % (ref 12–46)
Lymphs Abs: 2.4 10*3/uL (ref 0.7–4.0)
MCH: 27.3 pg (ref 26.0–34.0)
MCHC: 33.3 g/dL (ref 30.0–36.0)
MCV: 82 fL (ref 78.0–100.0)
MONO ABS: 0.6 10*3/uL (ref 0.1–1.0)
Monocytes Relative: 6 % (ref 3–12)
Neutro Abs: 7.3 10*3/uL (ref 1.7–7.7)
Neutrophils Relative %: 68 % (ref 43–77)
Platelets: 255 10*3/uL (ref 150–400)
RBC: 4.1 MIL/uL — AB (ref 4.22–5.81)
RDW: 16.1 % — ABNORMAL HIGH (ref 11.5–15.5)
WBC: 10.7 10*3/uL — ABNORMAL HIGH (ref 4.0–10.5)

## 2014-08-28 LAB — TROPONIN I: Troponin I: 0.04 ng/mL — ABNORMAL HIGH (ref <0.031)

## 2014-08-28 LAB — ETHANOL: Alcohol, Ethyl (B): <5 mg/dL (ref 0–9)

## 2014-08-28 LAB — CBG MONITORING, ED: Glucose-Capillary: 88 mg/dL (ref 70–99)

## 2014-08-28 LAB — LIPASE, BLOOD: Lipase: 31 U/L (ref 11–59)

## 2014-08-28 LAB — I-STAT TROPONIN, ED: TROPONIN I, POC: 0 ng/mL (ref 0.00–0.08)

## 2014-08-28 MED ORDER — ASPIRIN 81 MG PO CHEW
324.0000 mg | CHEWABLE_TABLET | Freq: Once | ORAL | Status: AC
  Administered 2014-08-29: 324 mg via ORAL
  Filled 2014-08-28: qty 4

## 2014-08-28 MED ORDER — SODIUM CHLORIDE 0.9 % IV BOLUS (SEPSIS)
1000.0000 mL | Freq: Once | INTRAVENOUS | Status: AC
  Administered 2014-08-28: 1000 mL via INTRAVENOUS

## 2014-08-28 MED ORDER — IOHEXOL 300 MG/ML  SOLN
25.0000 mL | Freq: Once | INTRAMUSCULAR | Status: AC | PRN
  Administered 2014-08-28: 25 mL via ORAL

## 2014-08-28 MED ORDER — IOHEXOL 300 MG/ML  SOLN
100.0000 mL | Freq: Once | INTRAMUSCULAR | Status: AC | PRN
  Administered 2014-08-28: 100 mL via INTRAVENOUS

## 2014-08-28 MED ORDER — SODIUM CHLORIDE 0.9 % IV BOLUS (SEPSIS)
1000.0000 mL | Freq: Once | INTRAVENOUS | Status: AC
  Administered 2014-08-29: 1000 mL via INTRAVENOUS

## 2014-08-28 MED ORDER — NALOXONE HCL 0.4 MG/ML IJ SOLN
0.4000 mg | Freq: Once | INTRAMUSCULAR | Status: AC
  Administered 2014-08-28: 0.4 mg via INTRAVENOUS
  Filled 2014-08-28: qty 1

## 2014-08-28 NOTE — ED Notes (Signed)
Pt awaiting repeat troponin; using restroom at this time. Denies any complaints.

## 2014-08-28 NOTE — ED Notes (Addendum)
Pt has drank over half of contrast. CT aware

## 2014-08-28 NOTE — ED Notes (Signed)
Pt c/o episodes of dark diarrhea on yesterday. Today pt took his wife to a doctor appointment and returned home c/o fatigue and weakness. Per family, pt became diaphoretic and pale with episodes of emesis and nausea. Pt also c/o Left sided chest pain; hx of MI. Pt lethargic on assessment; alert to voice. Pt reports chronic pain with last medication this morning.

## 2014-08-28 NOTE — ED Notes (Signed)
Per EMS- pt had diarrhea yesterday, reports it was dark. Reports today nausea with dry heaves, and weakness after taking wife to MD. Pt reports fatigued and arouses to voice. Pt is normally not this tired per family. Pupils constricted, last pain med was this morning at 0900. 20G L hand placed

## 2014-08-28 NOTE — ED Provider Notes (Signed)
CSN: [id number]     Arrival date & time 08/28/14  1503 History   First MD Initiated Contact with Patient 08/28/14 1505     Chief Complaint  Patient presents with  . Weakness  . Nausea  . Diarrhea     (Consider location/radiation/quality/duration/timing/severity/associated sxs/prior Treatment) HPI  [name] Parks is a 69 year old male has a history of hypertension, hyperlipidemia, diabetes, obstructive sleep apnea. His coming today with presyncopal symptoms since today associated with increased tiredness. He was having diarrhea yesterday, he estimates 5 episodes.  Didn't notice any blood in the stool.  Today started having dry heaving but didn't throw anything up. He was sitting down when he went to stand up he was presyncopal. He didn't faint. He's had similar symptoms in the past when he had pneumonia. He currently denies any chest pain, shortness of breath, abdominal pain, dysuria but does note increased urinary frequency.  He takes narcotic pain medicines at home but says he hasn't taken more than he is supposed to.  No headache or head injuries.  Past Medical History  Diagnosis Date  . PTSD (post-traumatic stress disorder)   . Hypertension   . Hyperlipidemia   . Type II diabetes mellitus   . CAP (community acquired pneumonia) 10/17/2013  . OSA on CPAP   . Migraines     "long time ago; none lately" (10/17/2013)   Past Surgical History  Procedure Laterality Date  . Joint replacement    . Total knee arthroplasty Right ~ 2012  . Total knee arthroplasty Left ~ 2013  . Excisional hemorrhoidectomy  1970's?   History reviewed. No pertinent family history. History  Substance Use Topics  . Smoking status: Current Every Day Smoker -- 1.00 packs/day for 50 years    Types: Cigarettes  . Smokeless tobacco: Never Used  . Alcohol Use: Yes     Comment: "used to drink a long time ago; none since the 1970's"    Review of Systems  Constitutional: Negative for fever and chills.  Eyes:  Negative for redness.  Respiratory: Negative for cough and shortness of breath.   Cardiovascular: Negative for chest pain.  Gastrointestinal: Positive for nausea, vomiting and diarrhea. Negative for abdominal pain.  Genitourinary: Negative for dysuria.  Skin: Negative for rash.  Neurological: Positive for weakness. Negative for headaches.  All other systems reviewed and are negative.     Allergies  Paxil; Trazodone and nefazodone; and Wellbutrin  Home Medications   Prior to Admission medications   Medication Sig Start Date End Date Taking? Authorizing Provider  albuterol (PROVENTIL HFA;VENTOLIN HFA) 108 (90 BASE) MCG/ACT inhaler Inhale 2 puffs into the lungs every 4 (four) hours as needed for wheezing or shortness of breath. 05/08/14  Yes Ardith Darkaleb M Parker, MD  aspirin EC 81 MG tablet Take 81 mg by mouth daily.   Yes Historical Provider, MD  atorvastatin (LIPITOR) 80 MG tablet Take 80 mg by mouth at bedtime.   Yes Historical Provider, MD  chlorthalidone (HYGROTON) 25 MG tablet Take 12.5 mg by mouth daily.   Yes Historical Provider, MD  cholecalciferol (VITAMIN D) 1000 UNITS tablet Take 1,000 Units by mouth daily.   Yes Historical Provider, MD  flunisolide (NASALIDE) 25 MCG/ACT (0.025%) SOLN Place 2 sprays into the nose 2 (two) times daily as needed (for sinuses).   Yes Historical Provider, MD  gabapentin (NEURONTIN) 300 MG capsule Take 300-900 mg by mouth 3 (three) times daily. Take 300 mg every morning and at 2 pm and take 900 mg at  bedtime   Yes Historical Provider, MD  HYDROcodone-acetaminophen (NORCO) 10-325 MG per tablet Take 1 tablet by mouth 4 (four) times daily as needed (for pain).   Yes Historical Provider, MD  insulin aspart (NOVOLOG) 100 UNIT/ML injection Inject 6 Units into the skin 3 (three) times daily with meals. Patient taking differently: Inject 4 Units into the skin 3 (three) times daily with meals. Pt uses sliding scale 10/20/13  Yes Tora Kindred York, PA-C  insulin  glargine (LANTUS) 100 UNIT/ML injection Inject 0.25 mLs (25 Units total) into the skin at bedtime. Patient taking differently: Inject 28 Units into the skin at bedtime.  10/20/13  Yes Marianne L York, PA-C  metFORMIN (GLUCOPHAGE) 500 MG tablet Take 500 mg by mouth 3 (three) times daily.   Yes Historical Provider, MD  metoprolol tartrate (LOPRESSOR) 25 MG tablet Take 37.5 mg by mouth 2 (two) times daily.   Yes Historical Provider, MD  mirtazapine (REMERON) 15 MG tablet Take 7.5 mg by mouth at bedtime.   Yes Historical Provider, MD  naproxen (NAPROSYN) 375 MG tablet Take 375 mg by mouth 2 (two) times daily as needed (for knee pain).   Yes Historical Provider, MD  omeprazole (PRILOSEC) 20 MG capsule Take 20 mg by mouth 2 (two) times daily.   Yes Historical Provider, MD  PARoxetine (PAXIL) 40 MG tablet Take 40 mg by mouth daily.   Yes Historical Provider, MD  potassium chloride SA (K-DUR,KLOR-CON) 20 MEQ tablet Take 20 mEq by mouth daily.   Yes Historical Provider, MD  senna-docusate (SENOKOT-S) 8.6-50 MG per tablet Take 2 tablets by mouth at bedtime.   Yes Historical Provider, MD  temazepam (RESTORIL) 30 MG capsule Take 30 mg by mouth at bedtime as needed for sleep.   Yes Historical Provider, MD  glipiZIDE (GLUCOTROL) 5 MG tablet Take by mouth 2 (two) times daily.    Historical Provider, MD  metoprolol (LOPRESSOR) 50 MG tablet Take 1 tablet (50 mg total) by mouth 2 (two) times daily. 10/20/13   Tora Kindred York, PA-C  terazosin (HYTRIN) 10 MG capsule Take 10 mg by mouth at bedtime.    Historical Provider, MD   BP 149/76 mmHg  Pulse 62  Temp(Src) 97.4 F (36.3 C) (Oral)  Resp 14  Ht  (1.803 m)  Wt 192 lb (87.091 kg)  BMI 26.79 kg/m2  SpO2 98% Physical Exam  Constitutional: He is oriented to person, place, and time. No distress.  HENT:  Head: Normocephalic and atraumatic.  Eyes: EOM are normal. Pupils are equal, round, and reactive to light.  Neck: Normal range of motion. Neck supple.   Cardiovascular: Normal rate.   Pulmonary/Chest: Effort normal. No respiratory distress.  Abdominal: Soft. There is tenderness (LLQ). There is guarding. There is no rebound.  Musculoskeletal: Normal range of motion.  Neurological: He is alert and oriented to person, place, and time. He has normal strength. He is not disoriented. No cranial nerve deficit or sensory deficit. Coordination normal. GCS eye subscore is 3. GCS verbal subscore is 5. GCS motor subscore is 6.  Sleepy but easily arousable to voice  Skin: No rash noted. He is not diaphoretic.  Psychiatric: He has a normal mood and affect.    ED Course  Procedures (including critical care time) Labs Review Labs Reviewed  CBC WITH DIFFERENTIAL/PLATELET  COMPREHENSIVE METABOLIC PANEL  LIPASE, BLOOD  URINALYSIS, ROUTINE W REFLEX MICROSCOPIC  ETHANOL  CBG MONITORING, ED  I-STAT TROPOININ, ED    Imaging Review No results found.  EKG Interpretation   Date/Time:  Tuesday August 28 2014 15:10:26 EDT Ventricular Rate:  61 PR Interval:  158 QRS Duration: 97 QT Interval:  500 QTC Calculation: 504 R Axis:   51 Text Interpretation:  Sinus rhythm Probable left ventricular hypertrophy  Anterior Q waves, possibly due to LVH Prolonged QT interval No significant  change was found Confirmed by Manus Gunning  MD, STEPHEN [phone]) on 08/28/2014  3:14:55 PM      MDM   Final diagnoses:  None    Sulaiman Imbert is a 69 year old male has a history of hypertension, hyperlipidemia, diabetes, obstructive sleep apnea. His coming today with presyncopal symptoms since today associated with increased tiredness.  Diarrhea yesterday and nausea today.  Exam as above AFVSS.  TTP in the LLQ of the abdomen with voluntary guarding.  Neuro exam normal as above but patient sleepy.    Will obtain labs, give a small amount of narcan as patient takes narcotic pain medicines at home. Will obtain CXR and CT abdomen to eval for diverticulitis given the LLQ  abdominal pain associated with the diarrhea.  ekg obtained and patient with coved pattern in the anterior leads but denies any chest pain/shortness of breath.  Will obtain trop to further eval.  First trop neg.  CT abdomen/pelvis WNL.  All labs WNL.  CXR unremarkable.  Orthostatic vital signs +.  Will give 2L NS  Repeat trop detectable but patient continues to deny cp/sob.  Feel acs is very unlikely but will need to cycle trops.  Will admit for dehydration with presyncopal sx and cycling of CE.      Silas Flood, MD 08/29/14 6045  Glynn Octave, MD 08/29/14 1340

## 2014-08-28 NOTE — ED Notes (Signed)
Pt attempting to drink contrast

## 2014-08-28 NOTE — ED Notes (Signed)
CBG meter reading 88.

## 2014-08-28 NOTE — H&P (Signed)
[facility] History and Physical  Berdell Hostetler ZOX:[id number] DOB: [birth year] DOA: 08/28/2014  Referring physician: ED physician PCP: Caroline Sauger, MD  Specialists:   Chief Complaint: Nausea, vomiting, diarrhea and syncope  HPI: [name] Parks is a 69 y.o. male with a past medical history of hypertension, hyperlipidemia, GERD, PTSD, diabetes mellitus, OSA on CPAP, migraine headaches, COPD, who presents with nausea, vomiting, diarrhea and syncope  Patient reports that he has been having nausea, vomiting, diarrhea in the past 2 days. He vomited 2 or 3 times without blood in the vomitus. He had at least 5 bowel movements with loose stool yesterday. He has some mild abdominal pain over left lower quadrant. He had one episode of chest pain which lasted for about 5 minutes and resolved spontaneously. Currently he does not have any chest pain.  He also reports that he passed out at about 3:30 PM. He lost consciousness, which was witnessed by his wife. It lasted for about 1-2 minutes. He did not injure himself. No seizure activity. He denies any symptoms of stroke, such as unilateral weakness or numbness, vision change or hearing loss. He reports that this happened 2 weeks ago. He did not seek for treatment.  Patient denies fever, chills, headaches, cough, chest pain, SOB, dysuria, urgency, frequency, hematuria, skin rashes or leg swelling. No unilateral weakness, numbness or tingling sensations. No vision change or hearing loss.  In ED, patient was found to have negative CT-head and Chest x-ray are negative for acute abnormalities. 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 CBC 10.7, troponin 0.04, negative urinalysis, renal function okay, alcohol level negative. His EKG showed QTc 504, mildly elevated ST in V1 to V3 and Q waves in V1 to V2, which are similar to previous EKG on  05/07/14. Patient is admitted to inpatient for further evaluation and treatment.  Review of Systems: As presented in the history of presenting illness, rest negative.  Where does patient live?  At home Can patient participate in ADLs? little  Allergy:  Allergies  Allergen Reactions  . Paxil [Paroxetine Hcl]     Pt reports allergy from [place] paperwork (pt is prescribed this med)  . Trazodone And Nefazodone   . Wellbutrin [Bupropion]     Past Medical History  Diagnosis Date  . PTSD (post-traumatic stress disorder)   . Hypertension   . Hyperlipidemia   . Type II diabetes mellitus   . CAP (community acquired pneumonia) 10/17/2013  . OSA on CPAP   . Migraines     "long time ago; none lately" (10/17/2013)  . COPD (chronic obstructive pulmonary disease)     Past Surgical History  Procedure Laterality Date  . Joint replacement    . Total knee arthroplasty Right ~ 2012  . Total knee arthroplasty Left ~ 2013  . Excisional hemorrhoidectomy  1970's?    Social History:  reports that he has been smoking Cigarettes.  He has a 50 pack-year smoking history. He has never used smokeless tobacco. He reports that he drinks alcohol. He reports that he does not use illicit drugs.  Family History:  Family History  Problem Relation Age of Onset  . Diabetes Mother   . Heart attack Father   . Diabetes Sister      Prior to Admission medications   Medication Sig Start Date End Date Taking? Authorizing Provider  albuterol (PROVENTIL HFA;VENTOLIN HFA) 108 (90 BASE) MCG/ACT inhaler Inhale  2 puffs into the lungs every 4 (four) hours as needed for wheezing or shortness of breath. 05/08/14  Yes Ardith Dark, MD  aspirin EC 81 MG tablet Take 81 mg by mouth daily.   Yes Historical Provider, MD  atorvastatin (LIPITOR) 80 MG tablet Take 80 mg by mouth at bedtime.   Yes Historical Provider, MD  chlorthalidone (HYGROTON) 25 MG tablet Take 12.5 mg by mouth daily.   Yes Historical Provider, MD   cholecalciferol (VITAMIN D) 1000 UNITS tablet Take 1,000 Units by mouth daily.   Yes Historical Provider, MD  flunisolide (NASALIDE) 25 MCG/ACT (0.025%) SOLN Place 2 sprays into the nose 2 (two) times daily as needed (for sinuses).   Yes Historical Provider, MD  gabapentin (NEURONTIN) 300 MG capsule Take 300-900 mg by mouth 3 (three) times daily. Take 300 mg every morning and at 2 pm and take 900 mg at bedtime   Yes Historical Provider, MD  HYDROcodone-acetaminophen (NORCO) 10-325 MG per tablet Take 1 tablet by mouth 4 (four) times daily as needed (for pain).   Yes Historical Provider, MD  insulin aspart (NOVOLOG) 100 UNIT/ML injection Inject 6 Units into the skin 3 (three) times daily with meals. Patient taking differently: Inject 4 Units into the skin 3 (three) times daily with meals. Pt uses sliding scale 10/20/13  Yes Tora Kindred York, PA-C  insulin glargine (LANTUS) 100 UNIT/ML injection Inject 0.25 mLs (25 Units total) into the skin at bedtime. Patient taking differently: Inject 28 Units into the skin at bedtime.  10/20/13  Yes Marianne L York, PA-C  metFORMIN (GLUCOPHAGE) 500 MG tablet Take 500 mg by mouth 3 (three) times daily.   Yes Historical Provider, MD  metoprolol tartrate (LOPRESSOR) 25 MG tablet Take 37.5 mg by mouth 2 (two) times daily.   Yes Historical Provider, MD  mirtazapine (REMERON) 15 MG tablet Take 7.5 mg by mouth at bedtime.   Yes Historical Provider, MD  naproxen (NAPROSYN) 375 MG tablet Take 375 mg by mouth 2 (two) times daily as needed (for knee pain).   Yes Historical Provider, MD  omeprazole (PRILOSEC) 20 MG capsule Take 20 mg by mouth 2 (two) times daily.   Yes Historical Provider, MD  PARoxetine (PAXIL) 40 MG tablet Take 40 mg by mouth daily.   Yes Historical Provider, MD  potassium chloride SA (K-DUR,KLOR-CON) 20 MEQ tablet Take 20 mEq by mouth daily.   Yes Historical Provider, MD  senna-docusate (SENOKOT-S) 8.6-50 MG per tablet Take 2 tablets by mouth at bedtime.   Yes  Historical Provider, MD  temazepam (RESTORIL) 30 MG capsule Take 30 mg by mouth at bedtime as needed for sleep.   Yes Historical Provider, MD  glipiZIDE (GLUCOTROL) 5 MG tablet Take by mouth 2 (two) times daily.    Historical Provider, MD  metoprolol (LOPRESSOR) 50 MG tablet Take 1 tablet (50 mg total) by mouth 2 (two) times daily. 10/20/13   Tora Kindred York, PA-C  terazosin (HYTRIN) 10 MG capsule Take 10 mg by mouth at bedtime.    Historical Provider, MD    Physical Exam: Filed Vitals:   08/29/14 0315 08/29/14 0330 08/29/14 0345 08/29/14 0500  BP: 145/65 148/71 159/74 170/83  Pulse: 72 73 73 77  Temp:      TempSrc:      Resp: 17 18 19 18   Height:      Weight:      SpO2: 98% 97% 97% 96%   General: Not in acute distress HEENT:  Eyes: PERRL, EOMI, no scleral icterus       ENT: No discharge from the ears and nose, no pharynx injection, no tonsillar enlargement.        Neck: No JVD, no bruit, no mass felt. Cardiac: S1/S2, RRR, No murmurs, No gallops or rubs Pulm: Good air movement bilaterally. Clear to auscultation bilaterally. No rales, wheezing, rhonchi or rubs. Abd: Soft, nondistended, mildly tender over LLQ, no rebound pain, no organomegaly, BS present Ext: No edema bilaterally. 2+DP/PT pulse bilaterally Musculoskeletal: No joint deformities, erythema, or stiffness, ROM full Skin: No rashes.  Neuro: Alert and oriented X3, cranial nerves II-XII grossly intact, muscle strength 5/5 in all extremeties, sensation to light touch intact. Brachial reflex 2+ bilaterally. Knee reflex 1+ bilaterally. Negative Babinski's sign. Normal finger to nose test. Psych: Patient is not psychotic, no suicidal or hemocidal ideation.  Labs on Admission:  Basic Metabolic Panel:  Recent Labs Lab 08/28/14 1537  NA 142  K 3.7  CL 106  CO2 25  GLUCOSE 89  BUN 11  CREATININE 1.09  CALCIUM 9.3   Liver Function Tests:  Recent Labs Lab 08/28/14 1537  AST 25  ALT 15  ALKPHOS 80  BILITOT 0.5   PROT 6.3  ALBUMIN 3.4*    Recent Labs Lab 08/28/14 1537  LIPASE 31   No results for input(s): AMMONIA in the last 168 hours. CBC:  Recent Labs Lab 08/28/14 1537  WBC 10.7*  NEUTROABS 7.3  HGB 11.2*  HCT 33.6*  MCV 82.0  PLT 255   Cardiac Enzymes:  Recent Labs Lab 08/28/14 2220  TROPONINI 0.04*    BNP (last 3 results) No results for input(s): BNP in the last 8760 hours.  ProBNP (last 3 results)  Recent Labs  10/17/13 1407 05/07/14 1308  PROBNP 53.6 123.9    CBG:  Recent Labs Lab 08/28/14 1528  GLUCAP 88    Radiological Exams on Admission: Dg Chest 2 View  08/28/2014   CLINICAL DATA:  Sweating with emesis and weakness  EXAM: CHEST  2 VIEW  COMPARISON:  May 07, 2014  FINDINGS: There is underlying emphysematous change. Areas of interstitial fibrotic type change remain stable. There is no edema or consolidation. Heart size is normal. The pulmonary vascularity is stable and reflects the underlying emphysematous change. No adenopathy. There is mid thoracic dextroscoliosis. There is mild anterior wedging of several mid thoracic vertebral bodies.  IMPRESSION: Underlying emphysematous change with areas of interstitial fibrosis. No edema or consolidation. No new opacity compared to prior study.   Electronically Signed   By: Bretta Bang III M.D.   On: 08/28/2014 15:57   Ct Head Wo Contrast  08/28/2014   CLINICAL DATA:  Intermittent syncopal episodes preceded by headaches for the past year.  EXAM: CT HEAD WITHOUT CONTRAST  TECHNIQUE: Contiguous axial images were obtained from the base of the skull through the vertex without intravenous contrast.  COMPARISON:  None.  FINDINGS: Diffusely enlarged ventricles and subarachnoid spaces. Patchy white matter low density in both cerebral hemispheres. No intracranial hemorrhage, mass lesion or CT evidence of acute infarction. Unremarkable bones and included paranasal sinuses. Atheromatous arterial calcifications at the skull  base.  IMPRESSION: 1. No acute abnormality. 2. Mild diffuse cerebral atrophy. 3. Mild chronic small vessel white matter ischemic changes in both cerebral hemispheres.   Electronically Signed   By: Beckie Salts M.D.   On: 08/28/2014 19:32   Ct Abdomen Pelvis W Contrast  08/28/2014   CLINICAL DATA:  Left lower quadrant  pain, nausea/vomiting/diarrhea  EXAM: CT ABDOMEN AND PELVIS WITH CONTRAST  TECHNIQUE: Multidetector CT imaging of the abdomen and pelvis was performed using the standard protocol following bolus administration of intravenous contrast.  CONTRAST:  100mL OMNIPAQUE IOHEXOL 300 MG/ML  SOLN  COMPARISON:  None.  FINDINGS: Lower chest:  Emphysematous changes at the lung bases.  Hepatobiliary: 10 mm cyst in the left hepatic dome (series 3/image 11).  Gallbladder is unremarkable. No intrahepatic or extrahepatic ductal dilatation.  Pancreas: Within normal limits.  Spleen: Within normal limits.  Adrenals/Urinary Tract: 3.0 x 2.2 cm left adrenal nodule (series 3/image 20), statistically likely reflecting an adrenal adenoma, although technically indeterminate.  Right adrenal gland is within normal limits.  3.3 cm lateral interpolar left renal cyst (series 3/ image 31).  Right kidney is within normal limits.  No hydronephrosis.  Bladder is thick-walled.  Stomach/Bowel: Stomach is within normal limits.  No evidence of bowel obstruction.  Normal appendix.  Vascular/Lymphatic: Atherosclerotic calcifications of the abdominal aorta and branch vessels.  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  No suspicious abdominopelvic lymphadenopathy.  Reproductive: Mild prostatomegaly.  Other: Small fat containing left inguinal hernia.  Musculoskeletal: Mild degenerative changes of the visualized thoracolumbar spine.  IMPRESSION: No evidence of bowel obstruction.  Normal appendix.  Thick-walled bladder, possibly reflecting chronic bladder outlet obstruction.  Small fat containing left inguinal hernia.  No CT findings to account  for the patient's left lower quadrant abdominal pain.  3.0 cm left adrenal nodule, statistically reflecting an adrenal adenoma, although technically indeterminate. Consider MRI abdomen without contrast for further characterization as clinically warranted.  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  Additional ancillary findings as above.   Electronically Signed   By: Charline BillsSriyesh  Krishnan M.D.   On: 08/28/2014 19:36    EKG: Independently reviewed. EKG showed QTc 504, mildly elevated ST in V1 to V3 and Q waves in V1 to V2, which are similar to previous EKG on 05/07/14.  Assessment/Plan Principal Problem:   Diarrhea Active Problems:   Diabetes mellitus   Hypertension   Hyperlipidemia   Tobacco abuse   Elevated troponin   Dehydration   COPD (chronic obstructive pulmonary disease)   Syncope   Diabetes mellitus without complication  Nausea, vomiting, diarrhea: Etiology is not clear, likely due to viral gastroenteritis, but need to rule out other possibilities, such as C. Difficile colitis. Patient is not septic on admission. Hemodynamically stable. -Admitted to telemetry bed and -check C diff PCR, GI pathogen panel -Hydroxyzine for nausea (patient had  prolongation of QTc interval, not good candidate for Zofran) - IVF: 2 L normal saline, followed by 100 mL per hour  Syncope: It is likely multifactorial, including dehydration, orthostatic status, side effect of Terazosin, possible arrhythmia given abnormal EKG with prolongation of QTc interval. Patient does not have signs of stroke. CT head is negative for acute abnormalities. -Check UDS -Check orthostatic vital signs -Use hydroxyzine for nausea instead of Zofran -2-D echo -troponin 3 -hold Terazosin and chlorthalidone  Diabetes mellitus: A1c was 8.7 on 05/07/14. Patient is on glipizide, metformin and Lantus at home.  -decrease Lantus dose from 25 to 20 units daily -sliding scale insulin  Elevated troponin: Troponin 0.04 on admission.  Patient had old and mild ST elevation which is existed in  previous EKG on 05/07/14. Currently patient does not have any chest pain. -Aspirin, Lipitor, when necessary nitroglycerin, morphine, metoprolol -Troponin 3 -Repeat EKG morning  Hypertension: -Hold chlorthalidone due to syncope  -Metoprolol -When necessary hydralazine  OSA -CPAP  COPD: Stable. No signs of acute exacerbation -Albuterol inhaler when necessary  Tobacco abuse -Nicotine patch   DVT ppx: SQ Heparin      Code Status: Full code Family Communication: None at bed side.    Disposition Plan: Admit to inpatient   Date of Service 08/29/2014    Lorretta Harp [facility] Pager [phone]  If 7PM-7AM, please contact night-coverage [email or web address] Password [email or web address] 08/29/2014, 6:01 AM

## 2014-08-29 ENCOUNTER — Encounter (HOSPITAL_COMMUNITY): Payer: Self-pay | Admitting: Internal Medicine

## 2014-08-29 DIAGNOSIS — Z794 Long term (current) use of insulin: Secondary | ICD-10-CM | POA: Diagnosis not present

## 2014-08-29 DIAGNOSIS — Z96653 Presence of artificial knee joint, bilateral: Secondary | ICD-10-CM | POA: Diagnosis present

## 2014-08-29 DIAGNOSIS — Z720 Tobacco use: Secondary | ICD-10-CM | POA: Diagnosis present

## 2014-08-29 DIAGNOSIS — I10 Essential (primary) hypertension: Secondary | ICD-10-CM | POA: Diagnosis present

## 2014-08-29 DIAGNOSIS — E119 Type 2 diabetes mellitus without complications: Secondary | ICD-10-CM

## 2014-08-29 DIAGNOSIS — R112 Nausea with vomiting, unspecified: Secondary | ICD-10-CM | POA: Diagnosis present

## 2014-08-29 DIAGNOSIS — F4310 Post-traumatic stress disorder, unspecified: Secondary | ICD-10-CM | POA: Diagnosis present

## 2014-08-29 DIAGNOSIS — R197 Diarrhea, unspecified: Secondary | ICD-10-CM | POA: Diagnosis not present

## 2014-08-29 DIAGNOSIS — Z888 Allergy status to other drugs, medicaments and biological substances status: Secondary | ICD-10-CM | POA: Diagnosis not present

## 2014-08-29 DIAGNOSIS — E876 Hypokalemia: Secondary | ICD-10-CM | POA: Diagnosis present

## 2014-08-29 DIAGNOSIS — Z8701 Personal history of pneumonia (recurrent): Secondary | ICD-10-CM | POA: Diagnosis not present

## 2014-08-29 DIAGNOSIS — J449 Chronic obstructive pulmonary disease, unspecified: Secondary | ICD-10-CM | POA: Diagnosis present

## 2014-08-29 DIAGNOSIS — R55 Syncope and collapse: Secondary | ICD-10-CM | POA: Diagnosis present

## 2014-08-29 DIAGNOSIS — Z7982 Long term (current) use of aspirin: Secondary | ICD-10-CM | POA: Diagnosis not present

## 2014-08-29 DIAGNOSIS — G4733 Obstructive sleep apnea (adult) (pediatric): Secondary | ICD-10-CM | POA: Diagnosis present

## 2014-08-29 DIAGNOSIS — E785 Hyperlipidemia, unspecified: Secondary | ICD-10-CM | POA: Diagnosis present

## 2014-08-29 DIAGNOSIS — R7989 Other specified abnormal findings of blood chemistry: Secondary | ICD-10-CM

## 2014-08-29 DIAGNOSIS — K219 Gastro-esophageal reflux disease without esophagitis: Secondary | ICD-10-CM | POA: Diagnosis present

## 2014-08-29 DIAGNOSIS — E860 Dehydration: Secondary | ICD-10-CM | POA: Diagnosis present

## 2014-08-29 DIAGNOSIS — R079 Chest pain, unspecified: Secondary | ICD-10-CM

## 2014-08-29 DIAGNOSIS — R778 Other specified abnormalities of plasma proteins: Secondary | ICD-10-CM | POA: Diagnosis present

## 2014-08-29 DIAGNOSIS — A084 Viral intestinal infection, unspecified: Secondary | ICD-10-CM | POA: Diagnosis present

## 2014-08-29 DIAGNOSIS — G43909 Migraine, unspecified, not intractable, without status migrainosus: Secondary | ICD-10-CM | POA: Diagnosis present

## 2014-08-29 DIAGNOSIS — R0789 Other chest pain: Secondary | ICD-10-CM | POA: Diagnosis not present

## 2014-08-29 DIAGNOSIS — F17210 Nicotine dependence, cigarettes, uncomplicated: Secondary | ICD-10-CM | POA: Diagnosis present

## 2014-08-29 LAB — GLUCOSE, CAPILLARY
GLUCOSE-CAPILLARY: 108 mg/dL — AB (ref 70–99)
Glucose-Capillary: 124 mg/dL — ABNORMAL HIGH (ref 70–99)
Glucose-Capillary: 201 mg/dL — ABNORMAL HIGH (ref 70–99)
Glucose-Capillary: 93 mg/dL (ref 70–99)

## 2014-08-29 LAB — COMPREHENSIVE METABOLIC PANEL WITH GFR
ALT: 13 U/L (ref 0–53)
AST: 23 U/L (ref 0–37)
Albumin: 3.1 g/dL — ABNORMAL LOW (ref 3.5–5.2)
Alkaline Phosphatase: 79 U/L (ref 39–117)
Anion gap: 11 (ref 5–15)
BUN: 9 mg/dL (ref 6–23)
CO2: 25 mmol/L (ref 19–32)
Calcium: 9 mg/dL (ref 8.4–10.5)
Chloride: 103 mmol/L (ref 96–112)
Creatinine, Ser: 1.04 mg/dL (ref 0.50–1.35)
GFR calc Af Amer: 83 mL/min — ABNORMAL LOW
GFR calc non Af Amer: 71 mL/min — ABNORMAL LOW
Glucose, Bld: 151 mg/dL — ABNORMAL HIGH (ref 70–99)
Potassium: 3.2 mmol/L — ABNORMAL LOW (ref 3.5–5.1)
Sodium: 139 mmol/L (ref 135–145)
Total Bilirubin: 0.5 mg/dL (ref 0.3–1.2)
Total Protein: 5.8 g/dL — ABNORMAL LOW (ref 6.0–8.3)

## 2014-08-29 LAB — CBC
HCT: 32.3 % — ABNORMAL LOW (ref 39.0–52.0)
Hemoglobin: 10.9 g/dL — ABNORMAL LOW (ref 13.0–17.0)
MCH: 27.9 pg (ref 26.0–34.0)
MCHC: 33.7 g/dL (ref 30.0–36.0)
MCV: 82.6 fL (ref 78.0–100.0)
Platelets: 246 K/uL (ref 150–400)
RBC: 3.91 MIL/uL — ABNORMAL LOW (ref 4.22–5.81)
RDW: 16.2 % — ABNORMAL HIGH (ref 11.5–15.5)
WBC: 7.8 K/uL (ref 4.0–10.5)

## 2014-08-29 LAB — PROTIME-INR
INR: 1.09 (ref 0.00–1.49)
Prothrombin Time: 14.2 seconds (ref 11.6–15.2)

## 2014-08-29 LAB — RAPID URINE DRUG SCREEN, HOSP PERFORMED
Amphetamines: NOT DETECTED
BENZODIAZEPINES: NOT DETECTED
Barbiturates: NOT DETECTED
Cocaine: NOT DETECTED
Opiates: POSITIVE — AB
Tetrahydrocannabinol: NOT DETECTED

## 2014-08-29 LAB — TROPONIN I
TROPONIN I: 0.05 ng/mL — AB (ref <0.031)
Troponin I: 0.05 ng/mL — ABNORMAL HIGH (ref <0.031)
Troponin I: 0.04 ng/mL — ABNORMAL HIGH (ref <0.031)

## 2014-08-29 LAB — APTT: aPTT: 38 s — ABNORMAL HIGH (ref 24–37)

## 2014-08-29 LAB — I-STAT TROPONIN, ED: Troponin i, poc: 0 ng/mL (ref 0.00–0.08)

## 2014-08-29 LAB — HIV ANTIBODY (ROUTINE TESTING W REFLEX): HIV Screen 4th Generation wRfx: NONREACTIVE

## 2014-08-29 LAB — CLOSTRIDIUM DIFFICILE BY PCR: CDIFFPCR: NEGATIVE

## 2014-08-29 MED ORDER — GABAPENTIN 300 MG PO CAPS
900.0000 mg | ORAL_CAPSULE | Freq: Every day | ORAL | Status: DC
  Administered 2014-08-29 – 2014-08-30 (×2): 900 mg via ORAL
  Filled 2014-08-29 (×3): qty 3

## 2014-08-29 MED ORDER — INSULIN GLARGINE 100 UNIT/ML ~~LOC~~ SOLN
20.0000 [IU] | Freq: Every day | SUBCUTANEOUS | Status: DC
Start: 2014-08-29 — End: 2014-08-31
  Administered 2014-08-29 – 2014-08-31 (×3): 20 [IU] via SUBCUTANEOUS
  Filled 2014-08-29 (×3): qty 0.2

## 2014-08-29 MED ORDER — HYDROCODONE-ACETAMINOPHEN 10-325 MG PO TABS: 1.0000 | ORAL_TABLET | Freq: Four times a day (QID) | ORAL | Status: DC | PRN

## 2014-08-29 MED ORDER — NICOTINE 21 MG/24HR TD PT24
21.0000 mg | MEDICATED_PATCH | Freq: Every day | TRANSDERMAL | Status: DC
  Administered 2014-08-29 – 2014-08-31 (×3): 21 mg via TRANSDERMAL
  Filled 2014-08-29 (×3): qty 1

## 2014-08-29 MED ORDER — DOXYCYCLINE HYCLATE 100 MG PO TABS
100.0000 mg | ORAL_TABLET | Freq: Two times a day (BID) | ORAL | Status: DC
  Administered 2014-08-29 – 2014-08-31 (×4): 100 mg via ORAL
  Filled 2014-08-29 (×5): qty 1

## 2014-08-29 MED ORDER — SENNOSIDES-DOCUSATE SODIUM 8.6-50 MG PO TABS
2.0000 | ORAL_TABLET | Freq: Every day | ORAL | Status: DC
Start: 2014-08-29 — End: 2014-08-31
  Administered 2014-08-29 – 2014-08-30 (×2): 2 via ORAL
  Filled 2014-08-29 (×4): qty 2

## 2014-08-29 MED ORDER — ACETAMINOPHEN 650 MG RE SUPP: 650.0000 mg | Freq: Four times a day (QID) | RECTAL | Status: DC | PRN

## 2014-08-29 MED ORDER — ALBUTEROL SULFATE (2.5 MG/3ML) 0.083% IN NEBU: 2.0000 mg | INHALATION_SOLUTION | RESPIRATORY_TRACT | Status: DC | PRN

## 2014-08-29 MED ORDER — ACETAMINOPHEN 325 MG PO TABS: 650.0000 mg | ORAL_TABLET | Freq: Four times a day (QID) | ORAL | Status: DC | PRN

## 2014-08-29 MED ORDER — VITAMIN D3 25 MCG (1000 UNIT) PO TABS
1000.0000 [IU] | ORAL_TABLET | Freq: Every day | ORAL | Status: DC
  Administered 2014-08-29 – 2014-08-31 (×3): 1000 [IU] via ORAL
  Filled 2014-08-29 (×3): qty 1

## 2014-08-29 MED ORDER — NITROGLYCERIN 0.4 MG SL SUBL: 0.4000 mg | SUBLINGUAL_TABLET | SUBLINGUAL | Status: DC | PRN

## 2014-08-29 MED ORDER — TEMAZEPAM 15 MG PO CAPS: 30.0000 mg | ORAL_CAPSULE | Freq: Every evening | ORAL | Status: DC | PRN

## 2014-08-29 MED ORDER — GABAPENTIN 300 MG PO CAPS
300.0000 mg | ORAL_CAPSULE | Freq: Three times a day (TID) | ORAL | Status: DC
  Filled 2014-08-29 (×3): qty 3

## 2014-08-29 MED ORDER — PAROXETINE HCL 20 MG PO TABS
40.0000 mg | ORAL_TABLET | Freq: Every day | ORAL | Status: DC
  Administered 2014-08-29 – 2014-08-31 (×3): 40 mg via ORAL
  Filled 2014-08-29 (×4): qty 2

## 2014-08-29 MED ORDER — NAPROXEN 375 MG PO TABS
375.0000 mg | ORAL_TABLET | Freq: Two times a day (BID) | ORAL | Status: DC | PRN
  Filled 2014-08-29: qty 1

## 2014-08-29 MED ORDER — GABAPENTIN 300 MG PO CAPS
300.0000 mg | ORAL_CAPSULE | Freq: Two times a day (BID) | ORAL | Status: DC
  Administered 2014-08-29 – 2014-08-31 (×5): 300 mg via ORAL
  Filled 2014-08-29 (×8): qty 1

## 2014-08-29 MED ORDER — ASPIRIN EC 81 MG PO TBEC
81.0000 mg | DELAYED_RELEASE_TABLET | Freq: Every day | ORAL | Status: DC
  Administered 2014-08-29 – 2014-08-31 (×3): 81 mg via ORAL
  Filled 2014-08-29 (×3): qty 1

## 2014-08-29 MED ORDER — ALUM & MAG HYDROXIDE-SIMETH 200-200-20 MG/5ML PO SUSP: 30.0000 mL | Freq: Four times a day (QID) | ORAL | Status: DC | PRN

## 2014-08-29 MED ORDER — FLUNISOLIDE 25 MCG/ACT (0.025%) NA SOLN: 2.0000 | Freq: Two times a day (BID) | NASAL | Status: DC | PRN

## 2014-08-29 MED ORDER — SODIUM CHLORIDE 0.9 % IJ SOLN
3.0000 mL | Freq: Two times a day (BID) | INTRAMUSCULAR | Status: DC
  Administered 2014-08-29 – 2014-08-31 (×5): 3 mL via INTRAVENOUS

## 2014-08-29 MED ORDER — POTASSIUM CHLORIDE CRYS ER 20 MEQ PO TBCR
40.0000 meq | EXTENDED_RELEASE_TABLET | Freq: Once | ORAL | Status: AC
  Administered 2014-08-29: 40 meq via ORAL
  Filled 2014-08-29: qty 2

## 2014-08-29 MED ORDER — SODIUM CHLORIDE 0.9 % IV SOLN
INTRAVENOUS | Status: DC
Start: 2014-08-29 — End: 2014-08-31
  Administered 2014-08-29 – 2014-08-30 (×3): via INTRAVENOUS

## 2014-08-29 MED ORDER — ONDANSETRON HCL 4 MG PO TABS: 4.0000 mg | ORAL_TABLET | Freq: Four times a day (QID) | ORAL | Status: DC | PRN

## 2014-08-29 MED ORDER — INSULIN ASPART 100 UNIT/ML ~~LOC~~ SOLN
0.0000 [IU] | Freq: Three times a day (TID) | SUBCUTANEOUS | Status: DC
  Administered 2014-08-29: 3 [IU] via SUBCUTANEOUS
  Administered 2014-08-29: 1 [IU] via SUBCUTANEOUS
  Administered 2014-08-30: 2 [IU] via SUBCUTANEOUS
  Administered 2014-08-31 (×2): 1 [IU] via SUBCUTANEOUS

## 2014-08-29 MED ORDER — HYDRALAZINE HCL 20 MG/ML IJ SOLN: 5.0000 mg | INTRAMUSCULAR | Status: DC | PRN

## 2014-08-29 MED ORDER — ATORVASTATIN CALCIUM 80 MG PO TABS
80.0000 mg | ORAL_TABLET | Freq: Every day | ORAL | Status: DC
  Administered 2014-08-29 – 2014-08-30 (×2): 80 mg via ORAL
  Filled 2014-08-29 (×4): qty 1

## 2014-08-29 MED ORDER — HEPARIN SODIUM (PORCINE) 5000 UNIT/ML IJ SOLN
5000.0000 [IU] | Freq: Three times a day (TID) | INTRAMUSCULAR | Status: DC
  Administered 2014-08-29 – 2014-08-31 (×6): 5000 [IU] via SUBCUTANEOUS
  Filled 2014-08-29 (×6): qty 1

## 2014-08-29 MED ORDER — ONDANSETRON HCL 4 MG/2ML IJ SOLN: 4.0000 mg | Freq: Four times a day (QID) | INTRAMUSCULAR | Status: DC | PRN

## 2014-08-29 MED ORDER — MIRTAZAPINE 7.5 MG PO TABS
7.5000 mg | ORAL_TABLET | Freq: Every day | ORAL | Status: DC
  Administered 2014-08-29 – 2014-08-30 (×2): 7.5 mg via ORAL
  Filled 2014-08-29 (×4): qty 1

## 2014-08-29 MED ORDER — ALBUTEROL SULFATE HFA 108 (90 BASE) MCG/ACT IN AERS: 2.0000 | INHALATION_SPRAY | RESPIRATORY_TRACT | Status: DC | PRN

## 2014-08-29 MED ORDER — PANTOPRAZOLE SODIUM 40 MG PO TBEC
40.0000 mg | DELAYED_RELEASE_TABLET | Freq: Every day | ORAL | Status: DC
  Administered 2014-08-29 – 2014-08-31 (×3): 40 mg via ORAL
  Filled 2014-08-29 (×3): qty 1

## 2014-08-29 MED ORDER — METOPROLOL TARTRATE 25 MG PO TABS
37.5000 mg | ORAL_TABLET | Freq: Two times a day (BID) | ORAL | Status: DC
  Administered 2014-08-29 – 2014-08-31 (×6): 37.5 mg via ORAL
  Filled 2014-08-29 (×2): qty 1
  Filled 2014-08-29: qty 2
  Filled 2014-08-29 (×4): qty 1

## 2014-08-29 MED ORDER — HYDROXYZINE HCL 50 MG/ML IM SOLN
25.0000 mg | Freq: Four times a day (QID) | INTRAMUSCULAR | Status: DC | PRN
  Filled 2014-08-29: qty 0.5

## 2014-08-29 MED ORDER — MORPHINE SULFATE 2 MG/ML IJ SOLN: 2.0000 mg | INTRAMUSCULAR | Status: DC | PRN

## 2014-08-29 NOTE — Progress Notes (Signed)
UR Completed.  [phone]  

## 2014-08-29 NOTE — ED Notes (Signed)
Pt did not like [place] sandwich; given graham crackers and peanut butter

## 2014-08-29 NOTE — Evaluation (Signed)
Physical Therapy Evaluation Patient Details Name: [name] Parks MRN: [id number] DOB: 01/26/1946 Today's Date: 08/29/2014   History of Present Illness  HPI: [name] Parks is a 69 y.o. male with a past medical history of hypertension, hyperlipidemia, GERD, PTSD, diabetes mellitus, OSA on CPAP, migraine headaches, COPD, who presents with nausea, vomiting, diarrhea and syncope  Clinical Impression  Patient evaluated by Physical Therapy with no further acute PT needs identified. All education has been completed and the patient has no further questions.  See below for any follow-up Physial Therapy or equipment needs. PT is signing off. Thank you for this referral.     Follow Up Recommendations No PT follow up    Equipment Recommendations  None recommended by PT    Recommendations for Other Services       Precautions / Restrictions Precautions Precaution Comments: Instructed pt to self-monitor for activity tolerance Restrictions Weight Bearing Restrictions: No      Mobility  Bed Mobility Overal bed mobility: Independent                Transfers Overall transfer level: Independent Equipment used: None                Ambulation/Gait Ambulation/Gait assistance: Supervision;Independent Ambulation Distance (Feet): 250 Feet Assistive device: None Gait Pattern/deviations: WFL(Within Functional Limits)     General Gait Details: Cues to self-monitor for activity tolerance; Supervision for cues, then progressing to modified independent  Stairs            Wheelchair Mobility    Modified Rankin (Stroke Patients Only)       Balance Overall balance assessment: No apparent balance deficits (not formally assessed)                                           Pertinent Vitals/Pain Pain Assessment: No/denies pain    Home Living Family/patient expects to be discharged to:: Private residence Living Arrangements: Spouse/significant  other Available Help at Discharge: Family;Available 24 hours/day Type of Home: House Home Access: Stairs to enter Entrance Stairs-Rails: Right;Left;Can reach both Entrance Stairs-Number of Steps: 5 Home Layout: One level Home Equipment: Cane - single point      Prior Function Level of Independence: Independent;Independent with assistive device(s)         Comments: pt ambulating with spc for long distances; report he walks without cane in house     Hand Dominance   Dominant Hand: Right    Extremity/Trunk Assessment   Upper Extremity Assessment: Overall WFL for tasks assessed           Lower Extremity Assessment: Overall WFL for tasks assessed         Communication   Communication: No difficulties  Cognition Arousal/Alertness: Awake/alert Behavior During Therapy: WFL for tasks assessed/performed Overall Cognitive Status: Within Functional Limits for tasks assessed                      General Comments General comments (skin integrity, edema, etc.):   08/29/14 1142 08/29/14 1149  Vital Signs  Pulse Rate (!) 58 60  BP (!) 179/81 mmHg (!) 176/86 mmHg  Patient Position (if appropriate) Sitting (after walking in room) Sitting (post hallway amb)      Exercises        Assessment/Plan    PT Assessment Patent does not need any further PT services  PT Diagnosis Difficulty walking  PT Problem List    PT Treatment Interventions     PT Goals (Current goals can be found in the Care Plan section) Acute Rehab PT Goals Patient Stated Goal: hopes to feel better soon PT Goal Formulation: All assessment and education complete, DC therapy    Frequency     Barriers to discharge        Co-evaluation               End of Session   Activity Tolerance: Patient tolerated treatment well Patient left: in bed;with call bell/phone within reach;with family/visitor present Nurse Communication: Mobility status         Time: 1127-1150 PT Time  Calculation (min) (ACUTE ONLY): 23 min   Charges:   PT Evaluation $Initial PT Evaluation Tier I: 1 Procedure PT Treatments $Gait Training: 8-22 mins   PT G CodesOlen Pel:        Sarye Kath Hamff 08/29/2014, 12:21 PM  Van ClinesHolly Keshav Winegar, [name]  Acute Rehabilitation Services Pager [phone] Office [phone]

## 2014-08-29 NOTE — ED Notes (Signed)
Pt given turkey sandwich and coca cola. 

## 2014-08-29 NOTE — Progress Notes (Signed)
Patient refused CPAP for the night  

## 2014-08-29 NOTE — Progress Notes (Signed)
08/29/2014  Conducted shift report at bedside this am.  Patient arrived shortly prior to 7am.  Orders reviewed, telemetry applied and confirmed with CCMD.  Skin intact, full assessment to EPIC.  Family member at bedside.  Pt placed on enteric precautions per order to r/o cdiff and noro.  Will monitor. Theadora RamaKIRKMAN, Emaly Boschert Brooke

## 2014-08-29 NOTE — Progress Notes (Signed)
08/29/2014 6:45 PM  While administering insulin with dinner, pt reports that he wants me to look at a boil that he has had in his right groin area for some time.  Boil is approximately 1.5*1.5cm and is draining a small amount of purulent drainage.  MD notified.  Will monitor. Theadora RamaKIRKMAN, Kerrilyn Azbill Brooke

## 2014-08-29 NOTE — Progress Notes (Signed)
08/29/2014 10:57 AM  Reviewed enteric precautions education with patient and his family member, as family member and visitor are both at the bedside without appropriate PPE on.  Once finished with education, visitors were gladly compliant with precautionary measures and applied the appropriate PPE.  Pt is also aware that we need a stool sample (flushed the first sample this am despite being told during assessment) and that a hat is in place to assist with collection of that, to which he verbalized understanding.  Patient education regarding cdiff, hand hygiene, and necessary precautions was left at the bedside.  Will continue to monitor patient. Theadora RamaKIRKMAN, Eleni Frank Brooke

## 2014-08-29 NOTE — Progress Notes (Addendum)
08/29/2014 10:53 AM  This is a late entry.  Upon assessment this am, noted that patient was a high fall risk due to recent syncopal episode and other variables.  Explained this to the patient, as well as the necessary interventions normally taken per the fall bundle.  Pt verbalized understanding, however, declines for a bed alarm to be turned on at this time, despite education and my recommendation to maintain safety.  Pt has a family member that remains at the bedside as well.  Reinforced to the patient to please call prior to getting OOB, to which he verbalized understanding.  Will continue to monitor. Theadora RamaKIRKMAN, Kema Santaella Brooke

## 2014-08-29 NOTE — Progress Notes (Signed)
  Echocardiogram 2D Echocardiogram has been performed.  [name], [name] Parks M 08/29/2014, 2:46 PM

## 2014-08-29 NOTE — Progress Notes (Addendum)
Patient ID: [name] Parks, male   DOB: 30-Mar-1946, 69 y.o.   MRN: [id number]  [facility] PROGRESS NOTE  [name] Parks:[id number] DOB: November 13, 1945 DOA: 08/28/2014 PCP: Caroline Sauger, MD   Brief narrative:    69 y.o. male with HTN, HLD, GERD, DM, , hyperlipidemia, GERD, PTSD, diabetes mellitus, OSA on CPAP, migraine headaches, COPD, who presented with main concern of 2 days duration of progressively worsening nausea, non bloody vomiting, diarrhea and syncopal event lasting 1- 2 minutes and witnessed by his wife. This has been associated with abd cramps, poor oral intake, mostly left lower abd quadrant discomfort. Pt denied fevers, chills, any urinary concerns no known sick contacts or exposures, no focal neurological symptoms, no chest pain or shortness of breath, no headaches or visual changes.   In ED, patient was found to have negative CT-head and Chest x-ray  CT abdomen/pelvis with no acute abnormalities that could explain source of LLQ pain. CBC 10.7, troponin 0.04, negative urinalysis, renal function stable, alcohol level negative. EKG showed QTc 504, mildly elevated ST in V1 to V3 and Q waves in V1 to V2, which are similar to previous EKG on 05/07/14. Patient was admitted to inpatient for further evaluation and treatment.  Assessment/Plan:    Nausea, vomiting, diarrhea - unclear etiology at this time and thought to be of viral etiology, viral gastroenteritis - stool panel by PCR requested and well as C. Diff which is still pending - for now continue hydration until oral intake improves - advance diet as pt able to tolerate - provide analgesia as needed   Syncope - likely secondary to dehydration in the setting of the above  - ask for orthostatic vitals to be checked - also will need repeat 12 lead EKG to follow up on QTc interval  - will need PT evaluation once more medically stable  - holding terazosin and chlorthalidone   Leukocytosis - secondary to the principal  problem - WBC is now WNL  Right groin area boil - with mild drainage - place on doxycycline for now  Mild elevation in troponins - could be secondary to the above, demand ischemia - 2 D ECHO requested in ED, follow up on results - pt denies chest pain or shortness of breath - repeat 12 lead EKG and if pt develops chest pain will recycle CE's  Diabetes mellitus - A1c was 8.7 on 05/07/14. Patient is on glipizide, metformin and Lantus at home.  - decreased Lantus dose from 25 to 20 units daily - sliding scale insulin can be provided if needed   Hypokalemia - secondary to diarrhea and vomiting - will supplement and repeat BMP in AM  Hypertension: - Holding chlorthalidone due to syncope  - Metoprolol can be continued but if HR < 50, will need to hold as well  - When necessary hydralazine to be provided   OSA - provide CPAP at night time   COPD - Stable. No signs of acute exacerbation - provide BD's as needed   Tobacco abuse - Nicotine patch allowed  DVT prophylaxis - Heparin SQ  Code Status: Full.  Family Communication:  plan of care discussed with the patient and wife at bedside  Disposition Plan: Requires continued hospitalization until pt tolerating PO intake and stool studies back.   IV access:  Peripheral IV  Procedures and diagnostic studies:    Dg Chest 2 View  08/28/2014  Underlying emphysematous change with areas of interstitial fibrosis. No edema or consolidation. No new opacity compared to prior study.  Ct Head Wo Contrast   08/28/2014   No acute abnormality. 2. Mild diffuse cerebral atrophy. 3. Mild chronic small vessel white matter ischemic changes in both cerebral hemispheres.    Ct Abdomen Pelvis W Contrast  08/28/2014 No evidence of bowel obstruction.  Normal appendix.  Thick-walled bladder, possibly reflecting chronic bladder outlet obstruction.  Small fat containing left inguinal hernia.  No CT findings to account for the patient's left lower quadrant  abdominal pain.  3.0 cm left adrenal nodule, statistically reflecting an adrenal adenoma, although technically indeterminate. Consider MRI abdomen without contrast for further characterization as clinically warranted.  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    Medical Consultants:  None   Other Consultants:  None   IAnti-Infectives:   None   Debbora PrestoMAGICK-Zakya Halabi, MD  [facility] Pager [phone]  If 7PM-7AM, please contact night-coverage [email or web address] Password [email or web address] 08/29/2014, 3:57 PM   LOS: 0 days   HPI/Subjective: No events overnight. Denies chest pain or shortness of breath, no diarrhea or vomiting since arrival to the unit.   Objective: Filed Vitals:   08/29/14 0615 08/29/14 0728 08/29/14 1142 08/29/14 1149  BP: 165/80 157/60 179/81 176/86  Pulse: 72 61 58 60  Temp:  97.7 F (36.5 C)    TempSrc:  Oral    Resp: 15 16    Height:  5\' 11"  (1.803 m)    Weight:  88.1 kg (194 lb 3.6 oz)    SpO2: 98% 99%      Intake/Output Summary (Last 24 hours) at 08/29/14 1557 Last data filed at 08/29/14 1554  Gross per 24 hour  Intake    510 ml  Output    300 ml  Net    210 ml    Exam:   General:  Pt is alert, follows commands appropriately, not in acute distress  Cardiovascular: Regular rate and rhythm, no rubs, no gallops  Respiratory: Clear to auscultation bilaterally, no wheezing, no crackles, no rhonchi  Abdomen: Soft, non tender, non distended, bowel sounds present, no guarding  Extremities:  pulses DP and PT palpable bilaterally  Neuro: Grossly nonfocal  Data Reviewed: Basic Metabolic Panel:  Recent Labs Lab 08/28/14 1537 08/29/14 0535  NA 142 139  K 3.7 3.2*  CL 106 103  CO2 25 25  GLUCOSE 89 151*  BUN 11 9  CREATININE 1.09 1.04  CALCIUM 9.3 9.0   Liver Function Tests:  Recent Labs Lab 08/28/14 1537 08/29/14 0535  AST 25 23  ALT 15 13  ALKPHOS 80 79  BILITOT 0.5 0.5  PROT 6.3 5.8*  ALBUMIN 3.4* 3.1*    Recent Labs Lab 08/28/14 1537  LIPASE  31   CBC:  Recent Labs Lab 08/28/14 1537 08/29/14 0535  WBC 10.7* 7.8  NEUTROABS 7.3  --   HGB 11.2* 10.9*  HCT 33.6* 32.3*  MCV 82.0 82.6  PLT 255 246   Cardiac Enzymes:  Recent Labs Lab 08/28/14 2220 08/29/14 0535 08/29/14 0937  TROPONINI 0.04* 0.05* 0.04*   CBG:  Recent Labs Lab 08/28/14 1528 08/29/14 1143  GLUCAP 88 124*    Scheduled Meds: . aspirin EC  81 mg Oral Daily  . atorvastatin  80 mg Oral QHS  . cholecalciferol  1,000 Units Oral Daily  . gabapentin  300 mg Oral BID   And  . gabapentin  900 mg Oral QHS  . heparin  5,000 Units Subcutaneous 3 times per day  . insulin aspart  0-9 Units Subcutaneous TID WC  . insulin glargine  20 Units Subcutaneous Daily  . metoprolol tartrate  37.5 mg Oral BID  . mirtazapine  7.5 mg Oral QHS  . nicotine  21 mg Transdermal Daily  . pantoprazole  40 mg Oral Daily  . PARoxetine  40 mg Oral Daily  . senna-docusate  2 tablet Oral QHS  . sodium chloride  3 mL Intravenous Q12H   Continuous Infusions: . sodium chloride 50 mL/hr at 08/29/14 1506

## 2014-08-29 NOTE — ED Notes (Signed)
Stanton KidneyDebra, daughter to be contacted if needed [phone]

## 2014-08-30 ENCOUNTER — Encounter (HOSPITAL_COMMUNITY): Payer: Self-pay | Admitting: Physician Assistant

## 2014-08-30 DIAGNOSIS — R0789 Other chest pain: Secondary | ICD-10-CM | POA: Insufficient documentation

## 2014-08-30 DIAGNOSIS — E860 Dehydration: Secondary | ICD-10-CM

## 2014-08-30 DIAGNOSIS — R55 Syncope and collapse: Secondary | ICD-10-CM | POA: Insufficient documentation

## 2014-08-30 LAB — BASIC METABOLIC PANEL
Anion gap: 9 (ref 5–15)
BUN: 6 mg/dL (ref 6–23)
CALCIUM: 9.1 mg/dL (ref 8.4–10.5)
CHLORIDE: 108 mmol/L (ref 96–112)
CO2: 25 mmol/L (ref 19–32)
CREATININE: 0.98 mg/dL (ref 0.50–1.35)
GFR, EST NON AFRICAN AMERICAN: 82 mL/min — AB (ref >90)
Glucose, Bld: 108 mg/dL — ABNORMAL HIGH (ref 70–99)
Potassium: 3.3 mmol/L — ABNORMAL LOW (ref 3.5–5.1)
Sodium: 142 mmol/L (ref 135–145)

## 2014-08-30 LAB — CBC
HEMATOCRIT: 32.4 % — AB (ref 39.0–52.0)
Hemoglobin: 10.8 g/dL — ABNORMAL LOW (ref 13.0–17.0)
MCH: 27.3 pg (ref 26.0–34.0)
MCHC: 33.3 g/dL (ref 30.0–36.0)
MCV: 82 fL (ref 78.0–100.0)
Platelets: 258 10*3/uL (ref 150–400)
RBC: 3.95 MIL/uL — ABNORMAL LOW (ref 4.22–5.81)
RDW: 15.9 % — AB (ref 11.5–15.5)
WBC: 7.6 10*3/uL (ref 4.0–10.5)

## 2014-08-30 LAB — GLUCOSE, CAPILLARY
GLUCOSE-CAPILLARY: 190 mg/dL — AB (ref 70–99)
GLUCOSE-CAPILLARY: 181 mg/dL — AB (ref 70–99)
Glucose-Capillary: 112 mg/dL — ABNORMAL HIGH (ref 70–99)
Glucose-Capillary: 95 mg/dL (ref 70–99)

## 2014-08-30 LAB — TROPONIN I: TROPONIN I: 0.04 ng/mL — AB (ref <0.031)

## 2014-08-30 MED ORDER — POTASSIUM CHLORIDE CRYS ER 20 MEQ PO TBCR
40.0000 meq | EXTENDED_RELEASE_TABLET | Freq: Two times a day (BID) | ORAL | Status: AC
  Administered 2014-08-30 (×2): 40 meq via ORAL
  Filled 2014-08-30 (×2): qty 2

## 2014-08-30 NOTE — Progress Notes (Signed)
Pt refuses CPAP. He c/o sinus problems and states that he can't breathe with the mask on.

## 2014-08-30 NOTE — Progress Notes (Signed)
Patient ID: [name] Parks, male   DOB: 05-26-1945, 69 y.o.   MRN: [id number]  [facility] PROGRESS NOTE  [name] Parks WUJ:[id number] DOB: 06-16-1945 DOA: 08/28/2014 PCP: Caroline Sauger, MD   Brief narrative:    69 y.o. male with HTN, HLD, GERD, DM, , hyperlipidemia, GERD, PTSD, diabetes mellitus, OSA on CPAP, migraine headaches, COPD, who presented with main concern of 2 days duration of progressively worsening nausea, non bloody vomiting, diarrhea and syncopal event lasting 1- 2 minutes and witnessed by his wife. His symptoms of nausea, vomiting and diarrhea has resolved. He reports occasional chest pain, and he was found to have elevated troponins.   Assessment/Plan:    Nausea, vomiting, diarrhea - unclear etiology at this time and thought to be of viral etiology, viral gastroenteritis - stool panel by PCR requested  And is pending. c diff is pending.  - for now continue hydration until oral intake improves - advance diet as pt able to tolerate - provide analgesia as needed   Syncope - likely secondary to dehydration in the setting of the above  - slevated cardiac enzymes, EKG NSR, cardiology consulted and recommendations given.   Leukocytosis - secondary to the principal problem - WBC is now WNL .   Right groin area boil - with mild drainage - place on doxycycline for now  Mild elevation in troponins - could be secondary to the above, demand ischemia - 2 D ECHO ordered,. Cardiology consulted and recommended outpatient stress test and smoking cessation.   Diabetes mellitus - A1c was 8.7 on 05/07/14. Patient is on glipizide, metformin and Lantus at home.  CBG (last 3)   Recent Labs  08/30/14 0715 08/30/14 1130 08/30/14 1634  GLUCAP 112* 190* 95    Resume SSI AND LANTUS.   Hypokalemia - secondary to diarrhea and vomiting - will supplement and repeat BMP in AM  Hypertension: - Holding chlorthalidone due to syncope  - Metoprolol can be continued but if HR  < 50, will need to hold as well  - When necessary hydralazine to be provided   OSA - provide CPAP at night time   COPD - Stable. No signs of acute exacerbation - provide BD's as needed   Tobacco abuse - Nicotine patch allowed  DVT prophylaxis - Heparin SQ  Code Status: Full.  Family Communication:  plan of care discussed with the patient and wife at bedside  Disposition Plan: possibly home tomorrow.  IV access:  Peripheral IV  Procedures and diagnostic studies:    Dg Chest 2 View  08/28/2014  Underlying emphysematous change with areas of interstitial fibrosis. No edema or consolidation. No new opacity compared to prior study.   Ct Head Wo Contrast   08/28/2014   No acute abnormality. 2. Mild diffuse cerebral atrophy. 3. Mild chronic small vessel white matter ischemic changes in both cerebral hemispheres.    Ct Abdomen Pelvis W Contrast  08/28/2014 No evidence of bowel obstruction.  Normal appendix.  Thick-walled bladder, possibly reflecting chronic bladder outlet obstruction.  Small fat containing left inguinal hernia.  No CT findings to account for the patient's left lower quadrant abdominal pain.  3.0 cm left adrenal nodule, statistically reflecting an adrenal adenoma, although technically indeterminate. Consider MRI abdomen without contrast for further characterization as clinically warranted.  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    Medical Consultants:  None   Other Consultants:  None   IAnti-Infectives:   None   [name], MD  [facility] Pager [phone]  If 7PM-7AM, please  contact night-coverage [email or web address] Password [email or web address] 08/30/2014, 5:50 PM   LOS: 1 day   HPI/Subjective: No nausea or vomiting. Occasional chest pain.   Objective: Filed Vitals:   08/29/14 2120 08/30/14 0538 08/30/14 0905 08/30/14 1711  BP: 159/74 142/78 160/65 157/100  Pulse: 55 77 61 66  Temp: 98.4 F (36.9 C) 98.1 F (36.7 C) 97.6 F (36.4 C) 98.4 F (36.9 C)  TempSrc: Oral Oral Oral Oral   Resp: 18 17 17 18   Height:      Weight: 88.2 kg (194 lb 7.1 oz)     SpO2: 98% 95% 98% 95%    Intake/Output Summary (Last 24 hours) at 08/30/14 1750 Last data filed at 08/30/14 1717  Gross per 24 hour  Intake 1027.5 ml  Output   3375 ml  Net -2347.5 ml    Exam:   General:  Pt is alert, follows commands appropriately, not in acute distress  Cardiovascular: Regular rate and rhythm, no rubs, no gallops  Respiratory: Clear to auscultation bilaterally, no wheezing, no crackles, no rhonchi  Abdomen: Soft, non tender, non distended, bowel sounds present, no guarding  Extremities:  pulses DP and PT palpable bilaterally  Neuro: Grossly nonfocal  Data Reviewed: Basic Metabolic Panel:  Recent Labs Lab 08/28/14 1537 08/29/14 0535 08/30/14 0621  NA 142 139 142  K 3.7 3.2* 3.3*  CL 106 103 108  CO2 25 25 25   GLUCOSE 89 151* 108*  BUN 11 9 6   CREATININE 1.09 1.04 0.98  CALCIUM 9.3 9.0 9.1   Liver Function Tests:  Recent Labs Lab 08/28/14 1537 08/29/14 0535  AST 25 23  ALT 15 13  ALKPHOS 80 79  BILITOT 0.5 0.5  PROT 6.3 5.8*  ALBUMIN 3.4* 3.1*    Recent Labs Lab 08/28/14 1537  LIPASE 31   CBC:  Recent Labs Lab 08/28/14 1537 08/29/14 0535 08/30/14 0621  WBC 10.7* 7.8 7.6  NEUTROABS 7.3  --   --   HGB 11.2* 10.9* 10.8*  HCT 33.6* 32.3* 32.4*  MCV 82.0 82.6 82.0  PLT 255 246 258   Cardiac Enzymes:  Recent Labs Lab 08/28/14 2220 08/29/14 0535 08/29/14 0937 08/29/14 1535 08/30/14 0621  TROPONINI 0.04* 0.05* 0.04* 0.05* 0.04*   CBG:  Recent Labs Lab 08/29/14 1806 08/29/14 2119 08/30/14 0715 08/30/14 1130 08/30/14 1634  GLUCAP 201* 93 112* 190* 95    Scheduled Meds: . aspirin EC  81 mg Oral Daily  . atorvastatin  80 mg Oral QHS  . cholecalciferol  1,000 Units Oral Daily  . doxycycline  100 mg Oral Q12H  . gabapentin  300 mg Oral BID   And  . gabapentin  900 mg Oral QHS  . heparin  5,000 Units Subcutaneous 3 times per day  .  insulin aspart  0-9 Units Subcutaneous TID WC  . insulin glargine  20 Units Subcutaneous Daily  . metoprolol tartrate  37.5 mg Oral BID  . mirtazapine  7.5 mg Oral QHS  . nicotine  21 mg Transdermal Daily  . pantoprazole  40 mg Oral Daily  . PARoxetine  40 mg Oral Daily  . potassium chloride  40 mEq Oral BID  . senna-docusate  2 tablet Oral QHS  . sodium chloride  3 mL Intravenous Q12H   Continuous Infusions: . sodium chloride 50 mL/hr at 08/30/14 0027

## 2014-08-30 NOTE — Consult Note (Signed)
CARDIOLOGY CONSULT NOTE   Patient ID: Robb MatarLarry Kowal MRN: [id number] DOB/AGE: March 20, 1946 69 y.o.  Admit date: 08/28/2014  Primary Physician   [name],INDIA, MD Primary Cardiologist   New Reason for Consultation   Chest pain, elevated troponin  NGE:XBMWUHPI:Numair Clinton SawyerWilliamson is a 69 y.o. year old [place] with a history of hypertension, hyperlipidemia, tobacco use, GERD, PTSD, diabetes mellitus, OSA on CPAP, migraine headaches, COPD, and anemia, who was admitted 08/28/2014 with N&V, diarrhea, and ?syncope. On admission, orthostatic VS were checked and SBP decreased from 146 (lying) to 117 (standing).  He was hospitalized 04/2014 for chest pain and dyspnea, felt secondary to musculoskeletal pain in the setting of a COPD exacerbation. He did not have a stress test or echocardiogram.  Mr. Clinton SawyerWilliamson reports that since he was hospitalized for PNA in May 2015 and has had intermittent episodes of pre-syncope since that time. He reports he "can feel it coming on" and becomes nauseous, diaphoretic, and lightheaded. He states this occurs when he stands up from a sitting position as well as while he is sitting at rest. No history of palpitations. The episodes are happening 1-2 x month, may be orthostatic in nature.   He also reports intermittent chest pain for the last few months, which he states occurs with and without his symptoms of pre-syncope and is unrelated to exertion. His chest pain is located in the left side of his chest, and typically lasts for 2-3 minutes, however, sometimes can last for 5-10 minutes. He reports his pain feels like pressure, and he denies precipitating or relieving factors. It is  6-7/10 at its worst.   He reports dyspnea with minimal exertion that has gotten worse over the last few months. His exertion is limited by SOB.    States he experienced chest pain today in the left side of his chest, which lasted for "a few minutes" and felt like pressure. He denies  shortness of breath and palpitations. Currently, Mr. Clinton SawyerWilliamson reports his chest pain has resolved.     Past Medical History  Diagnosis Date  . PTSD (post-traumatic stress disorder)   . Hypertension   . Hyperlipidemia   . Type II diabetes mellitus   . CAP (community acquired pneumonia) 10/17/2013  . OSA on CPAP   . Migraines     "long time ago; none lately" (10/17/2013)  . COPD (chronic obstructive pulmonary disease)   . Shortness of breath dyspnea      Past Surgical History  Procedure Laterality Date  . Joint replacement    . Total knee arthroplasty Right ~ 2012  . Total knee arthroplasty Left ~ 2013  . Excisional hemorrhoidectomy  1970's?    Allergies  Allergen Reactions  . Paxil [Paroxetine Hcl]     [place] med)  . Trazodone And Nefazodone   . Wellbutrin [Bupropion]     I have reviewed the patient's current medications . aspirin EC  81 mg Oral Daily  . atorvastatin  80 mg Oral QHS  . cholecalciferol  1,000 Units Oral Daily  . doxycycline  100 mg Oral Q12H  . gabapentin  300 mg Oral BID   And  . gabapentin  900 mg Oral QHS  . heparin  5,000 Units Subcutaneous 3 times per day  . insulin aspart  0-9 Units Subcutaneous TID WC  . insulin glargine  20 Units Subcutaneous Daily  . metoprolol tartrate  37.5 mg Oral BID  . mirtazapine  7.5 mg Oral QHS  . nicotine  21 mg Transdermal Daily  . pantoprazole  40 mg Oral Daily  . PARoxetine  40 mg Oral Daily  . potassium chloride  40 mEq Oral BID  . senna-docusate  2 tablet Oral QHS  . sodium chloride  3 mL Intravenous Q12H   . sodium chloride 50 mL/hr at 08/30/14 0027   acetaminophen **OR** acetaminophen, albuterol, alum & mag hydroxide-simeth, flunisolide, hydrALAZINE, HYDROcodone-acetaminophen, hydrOXYzine, morphine injection, nitroGLYCERIN, temazepam  Medication Sig  albuterol (PROVENTIL HFA;VENTOLIN HFA) 108 (90 BASE) MCG/ACT inhaler Inhale 2 puffs into the lungs  every 4 (four) hours as needed for wheezing or shortness of breath.  aspirin EC 81 MG tablet Take 81 mg by mouth daily.  atorvastatin (LIPITOR) 80 MG tablet Take 80 mg by mouth at bedtime.  chlorthalidone (HYGROTON) 25 MG tablet Take 12.5 mg by mouth daily.  cholecalciferol (VITAMIN D) 1000 UNITS tablet Take 1,000 Units by mouth daily.  flunisolide (NASALIDE) 25 MCG/ACT (0.025%) SOLN Place 2 sprays into the nose 2 (two) times daily as needed (for sinuses).  gabapentin (NEURONTIN) 300 MG capsule Take 300-900 mg by mouth 3 (three) times daily. Take 300 mg every morning and at 2 pm and take 900 mg at bedtime  HYDROcodone-acetaminophen (NORCO) 10-325 MG per tablet Take 1 tablet by mouth 4 (four) times daily as needed (for pain).  insulin aspart (NOVOLOG) 100 UNIT/ML injection Inject 6 Units into the skin 3 (three) times daily with meals. Patient taking differently: Inject 4 Units into the skin 3 (three) times daily with meals. Pt uses sliding scale  insulin glargine (LANTUS) 100 UNIT/ML injection Inject 0.25 mLs (25 Units total) into the skin at bedtime. Patient taking differently: Inject 28 Units into the skin at bedtime.   metFORMIN (GLUCOPHAGE) 500 MG tablet Take 500 mg by mouth 3 (three) times daily.  metoprolol tartrate (LOPRESSOR) 25 MG tablet Take 37.5 mg by mouth 2 (two) times daily.  mirtazapine (REMERON) 15 MG tablet Take 7.5 mg by mouth at bedtime.  naproxen (NAPROSYN) 375 MG tablet Take 375 mg by mouth 2 (two) times daily as needed (for knee pain).  omeprazole (PRILOSEC) 20 MG capsule Take 20 mg by mouth 2 (two) times daily.  PARoxetine (PAXIL) 40 MG tablet Take 40 mg by mouth daily.  potassium chloride SA (K-DUR,KLOR-CON) 20 MEQ tablet Take 20 mEq by mouth daily.  senna-docusate (SENOKOT-S) 8.6-50 MG per tablet Take 2 tablets by mouth at bedtime.  temazepam (RESTORIL) 30 MG capsule Take 30 mg by mouth at bedtime as needed for sleep.  glipiZIDE (GLUCOTROL) 5 MG tablet Take by mouth 2  (two) times daily.  metoprolol (LOPRESSOR) 50 MG tablet Take 1 tablet (50 mg total) by mouth 2 (two) times daily.  terazosin (HYTRIN) 10 MG capsule Take 10 mg by mouth at bedtime.     History   Social History  . Marital Status: Married    Spouse Name: N/A  . Number of Children: N/A  . Years of Education: N/A   Occupational History  . Retired    Social History Main Topics  . Smoking status: Current Every Day Smoker -- 1.00 packs/day for 50 years    Types: Cigarettes  . Smokeless tobacco: Never Used  . Alcohol Use: Yes     Comment: "used to drink a long time ago; none since the 1970's"  . Drug Use: No  . Sexual Activity: Not Currently   Other Topics Concern  . Not on file   Social  History Narrative   Lives in [place]. Married.    Family Status  Relation Status Death Age  . Mother Deceased   . Father Deceased    Family History  Problem Relation Age of Onset  . Diabetes Mother   . Heart attack Father   . Diabetes Sister      ROS:  Full 14 point review of systems complete and found to be negative unless listed above.  Physical Exam: Blood pressure 160/65, pulse 61, temperature 97.6 F (36.4 C), temperature source Oral, resp. rate 17, height 5\' 11"  (1.803 m), weight 194 lb 7.1 oz (88.2 kg), SpO2 98 %.  General: Well developed, well nourished, male in no acute distress Head: Normocephalic and atraumatic, oropharynx without edema or exudate.  Lungs: Respiration unlabored. Clear to auscultation bilaterally. No wheezing, rales, or rhonchi. Heart: RRR S1 S2, no rub/gallop, no murmur. Pulses are 2+ all 4 extrem.   Neck: No carotid bruits. No JVD. Abdomen: Bowel sounds present, abdomen soft and non-tender without masses or hernias noted. Msk: No weakness, no joint deformities or effusions. Extremities: No clubbing or cyanosis. No edema.  Neuro: Alert and oriented X 3. No focal deficits noted. Psych: Good affect, responds appropriately Skin: No rashes or lesions  noted.  Labs:   Lab Results  Component Value Date   WBC 7.6 08/30/2014   HGB 10.8* 08/30/2014   HCT 32.4* 08/30/2014   MCV 82.0 08/30/2014   PLT 258 08/30/2014    Recent Labs  08/29/14 0535  INR 1.09     Recent Labs Lab 08/29/14 0535 08/30/14 0621  NA 139 142  K 3.2* 3.3*  CL 103 108  CO2 25 25  BUN 9 6  CREATININE 1.04 0.98  CALCIUM 9.0 9.1  PROT 5.8*  --   BILITOT 0.5  --   ALKPHOS 79  --   ALT 13  --   AST 23  --   GLUCOSE 151* 108*  ALBUMIN 3.1*  --    No results found for: MG  Recent Labs  08/29/14 0535 08/29/14 0937 08/29/14 1535 08/30/14 0621  TROPONINI 0.05* 0.04* 0.05* 0.04*    Recent Labs  08/28/14 1543 08/29/14 0137  TROPIPOC 0.00 0.00   Lab Results  Component Value Date   CHOL 134 05/08/2014   HDL 36* 05/08/2014   LDLCALC 80 05/08/2014   TRIG 91 05/08/2014   LIPASE  Date/Time Value Ref Range Status  08/28/2014 03:37 PM 31 11 - 59 U/L Final   Lab Results  Component Value Date   HGBA1C 8.7* 05/07/2014    ECG:  08/29/2014 Sinus bradycardia, rate 59, LDH  Radiology:  Dg Chest 2 View 08/28/2014   CLINICAL DATA:  Sweating with emesis and weakness  EXAM: CHEST  2 VIEW  COMPARISON:  May 07, 2014  FINDINGS: There is underlying emphysematous change. Areas of interstitial fibrotic type change remain stable. There is no edema or consolidation. Heart size is normal. The pulmonary vascularity is stable and reflects the underlying emphysematous change. No adenopathy. There is mid thoracic dextroscoliosis. There is mild anterior wedging of several mid thoracic vertebral bodies.  IMPRESSION: Underlying emphysematous change with areas of interstitial fibrosis. No edema or consolidation. No new opacity compared to prior study.   Electronically Signed   By: Bretta Bang III M.D.   On: 08/28/2014 15:57   Ct Head Wo Contrast 08/28/2014   CLINICAL DATA:  Intermittent syncopal episodes preceded by headaches for the past year.  EXAM: CT HEAD  WITHOUT  CONTRAST  TECHNIQUE: Contiguous axial images were obtained from the base of the skull through the vertex without intravenous contrast.  COMPARISON:  None.  FINDINGS: Diffusely enlarged ventricles and subarachnoid spaces. Patchy white matter low density in both cerebral hemispheres. No intracranial hemorrhage, mass lesion or CT evidence of acute infarction. Unremarkable bones and included paranasal sinuses. Atheromatous arterial calcifications at the skull base.  IMPRESSION: 1. No acute abnormality. 2. Mild diffuse cerebral atrophy. 3. Mild chronic small vessel white matter ischemic changes in both cerebral hemispheres.   Electronically Signed   By: Beckie Salts M.D.   On: 08/28/2014 19:32   Ct Abdomen Pelvis W Contrast 08/28/2014   CLINICAL DATA:  Left lower quadrant pain, nausea/vomiting/diarrhea  EXAM: CT ABDOMEN AND PELVIS WITH CONTRAST  TECHNIQUE: Multidetector CT imaging of the abdomen and pelvis was performed using the standard protocol following bolus administration of intravenous contrast.  CONTRAST:  OMNIPAQUE IOHEXOL 300 MG/ML  SOLN  COMPARISON:  None.  FINDINGS: Lower chest:  Emphysematous changes at the lung bases.  Hepatobiliary: 10 mm cyst in the left hepatic dome (series 3/image 11).  Gallbladder is unremarkable. No intrahepatic or extrahepatic ductal dilatation.  Pancreas: Within normal limits.  Spleen: Within normal limits.  Adrenals/Urinary Tract: 3.0 x 2.2 cm left adrenal nodule (series 3/image 20), statistically likely reflecting an adrenal adenoma, although technically indeterminate.  Right adrenal gland is within normal limits.  3.3 cm lateral interpolar left renal cyst (series 3/ image 31).  Right kidney is within normal limits.  No hydronephrosis.  Bladder is thick-walled.  Stomach/Bowel: Stomach is within normal limits.  No evidence of bowel obstruction.  Normal appendix.  Vascular/Lymphatic: Atherosclerotic calcifications of the abdominal aorta and branch vessels.  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  No suspicious abdominopelvic lymphadenopathy.  Reproductive: Mild prostatomegaly.  Other: Small fat containing left inguinal hernia.  Musculoskeletal: Mild degenerative changes of the visualized thoracolumbar spine.  IMPRESSION: No evidence of bowel obstruction.  Normal appendix.  Thick-walled bladder, possibly reflecting chronic bladder outlet obstruction.  Small fat containing left inguinal hernia.  No CT findings to account for the patient's left lower quadrant abdominal pain.  3.0 cm left adrenal nodule, statistically reflecting an adrenal adenoma, although technically indeterminate. Consider MRI abdomen without contrast for further characterization as clinically warranted.  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  Additional ancillary findings as above.   Electronically Signed   By: Charline Bills M.D.   On: 08/28/2014 19:36    ASSESSMENT AND PLAN:   The patient was seen today by Dr [place], the patient evaluated and the data reviewed.   Principal Problem:   Diarrhea - unclear etiology, thought to be secondary to viral gastroenteritis - c. diff negative, GI pathogen panel pending - symptoms improved - per IM  Active Problems: Chest pain / Elevated troponin - patient reports chest pain today, now relieved without medications - mild elevation in troponin with flat trend (0.04, 0.05, 0.04) - continue ASA 81 mg daily, statin, BB   Syncope - attributed to dehydration in the context of possible viral etiology of diarrhea/nausea/vomiting - chlorthalidone held on admission - ECG with sinus bradycardia - orthostatic vitals were positive on admission  Hypertension - home chlorthalidone held given syncope  - continue lopressor 37.5 BID  - now hypertensive w/ SBP 140s-160s  Hyperlipidemia - continue home statin - per IM  Dehydration - PO intake improved - per IM    COPD (chronic obstructive pulmonary disease) - stable, no evidence  of acute  exacerbation - inhaler/nebs PRN - per IM  Diabetes mellitus - glucose control per IM  Tobacco abuse -nicotine patch  Hypokalemia - s/p 40 meq yesterday and today - recheck in am   Signed: Theodore DemarkRhonda Barrett, PA-C 08/30/2014 3:06 PM Beeper [phone]  Co-Sign MD Patient seen and examined and history reviewed. Agree with above findings and plan. 69 yo BM with multiple cardiac risk factors admitted with near syncope and diarrhea. He was orthostatic on admission. He does describe intermittent dyspnea and chest pain for the past 6 months. This is random and not associated with exertion. No recent acceleration in symptoms. No prior stress testing. Ecg here shows possible old septal infarct. He is in NSR. Troponin slightly elevated with flat curve in nonspecific pattern. I don't think that his current issues are cardiac. Agree with holding diuretic. Given his multiple risk factors I do think it would be a good idea to do a nuclear stress test later as an outpatient once he has recovered from his acute illness. Recommend smoking cessation.  Wenona [place], MDFACC 08/30/2014 4:38 PM

## 2014-08-30 NOTE — Evaluation (Signed)
Occupational Therapy Evaluation and Discharge Summary Patient Details Name: [name] Parks MRN: [id number] DOB: January 18, 1946 Today's Date: 08/30/2014    History of Present Illness HPI: [name] MatarLarry Cantu is a 69 y.o. male with a past medical history of hypertension, hyperlipidemia, GERD, PTSD, diabetes mellitus, OSA on CPAP, migraine headaches, COPD, who presents with nausea, vomiting, diarrhea and syncope   Clinical Impression   Pt admitted with the above diagnosis and after after eval seems to be doing well from adl standpoint. Pt remains independent with all adls and was encouraged that he could still do everything after feeling so poor for days.  Will sign off at this time.    Follow Up Recommendations  No OT follow up    Equipment Recommendations  None recommended by OT    Recommendations for Other Services       Precautions / Restrictions Precautions Precautions: None Restrictions Weight Bearing Restrictions: No      Mobility Bed Mobility Overal bed mobility: Independent                Transfers Overall transfer level: Independent Equipment used: None                  Balance Overall balance assessment: No apparent balance deficits (not formally assessed)                                          ADL Overall ADL's : Modified independent                                       General ADL Comments: Pt uses cane at home.  Instructed wife to bring his cane in so he can walk in room as he does at home.     Vision Vision Assessment?: No apparent visual deficits   Perception     Praxis      Pertinent Vitals/Pain Pain Assessment: No/denies pain     Hand Dominance Right   Extremity/Trunk Assessment Upper Extremity Assessment Upper Extremity Assessment: Overall WFL for tasks assessed   Lower Extremity Assessment Lower Extremity Assessment: Defer to PT evaluation   Cervical / Trunk Assessment Cervical / Trunk  Assessment: Normal   Communication Communication Communication: No difficulties   Cognition Arousal/Alertness: Awake/alert Behavior During Therapy: WFL for tasks assessed/performed Overall Cognitive Status: Within Functional Limits for tasks assessed                     General Comments       Exercises       Shoulder Instructions      Home Living Family/patient expects to be discharged to:: Private residence Living Arrangements: Spouse/significant other Available Help at Discharge: Family;Available 24 hours/day Type of Home: House Home Access: Stairs to enter Entergy CorporationEntrance Stairs-Number of Steps: 5 Entrance Stairs-Rails: Right;Left;Can reach both Home Layout: One level     Bathroom Shower/Tub: Producer, television/film/videoWalk-in shower   Bathroom Toilet: Standard     Home Equipment: Cane - single point          Prior Functioning/Environment Level of Independence: Independent;Independent with assistive device(s)        Comments: pt ambulating with spc for long distances; report he walks without cane in house    OT Diagnosis:     OT Problem List:  OT Treatment/Interventions:      OT Goals(Current goals can be found in the care plan section) Acute Rehab OT Goals Patient Stated Goal: hopes to feel better soon OT Goal Formulation: All assessment and education complete, DC therapy  OT Frequency:     Barriers to D/C:            Co-evaluation              End of Session Nurse Communication: Mobility status  Activity Tolerance: Patient tolerated treatment well Patient left: in chair;with call bell/phone within reach;with family/visitor present   Time: 4098-1191 OT Time Calculation (min): 15 min Charges:  OT General Charges $OT Visit: 1 Procedure OT Evaluation $Initial OT Evaluation Tier I: 1 Procedure G-Codes:    Hope Budds September 15, 2014, 9:49 AM  [phone]

## 2014-08-31 ENCOUNTER — Other Ambulatory Visit: Payer: Self-pay | Admitting: Cardiology

## 2014-08-31 ENCOUNTER — Encounter (HOSPITAL_COMMUNITY): Payer: Self-pay | Admitting: *Deleted

## 2014-08-31 DIAGNOSIS — R778 Other specified abnormalities of plasma proteins: Secondary | ICD-10-CM

## 2014-08-31 DIAGNOSIS — R079 Chest pain, unspecified: Secondary | ICD-10-CM

## 2014-08-31 DIAGNOSIS — R7989 Other specified abnormal findings of blood chemistry: Secondary | ICD-10-CM

## 2014-08-31 LAB — GLUCOSE, CAPILLARY
GLUCOSE-CAPILLARY: 131 mg/dL — AB (ref 70–99)
Glucose-Capillary: 187 mg/dL — ABNORMAL HIGH (ref 70–99)

## 2014-08-31 LAB — BASIC METABOLIC PANEL WITH GFR
Anion gap: 8 (ref 5–15)
BUN: 9 mg/dL (ref 6–23)
CO2: 27 mmol/L (ref 19–32)
Calcium: 9 mg/dL (ref 8.4–10.5)
Chloride: 108 mmol/L (ref 96–112)
Creatinine, Ser: 1.05 mg/dL (ref 0.50–1.35)
GFR calc Af Amer: 82 mL/min — ABNORMAL LOW (ref >90)
GFR calc non Af Amer: 70 mL/min — ABNORMAL LOW (ref >90)
Glucose, Bld: 120 mg/dL — ABNORMAL HIGH (ref 70–99)
Potassium: 3.9 mmol/L (ref 3.5–5.1)
Sodium: 143 mmol/L (ref 135–145)

## 2014-08-31 LAB — GI PATHOGEN PANEL BY PCR, STOOL
C DIFFICILE TOXIN A/B: NOT DETECTED
Campylobacter by PCR: NOT DETECTED
Cryptosporidium by PCR: NOT DETECTED
E COLI 0157 BY PCR: NOT DETECTED
E coli (ETEC) LT/ST: NOT DETECTED
E coli (STEC): NOT DETECTED
G lamblia by PCR: NOT DETECTED
Norovirus GI/GII: NOT DETECTED
Rotavirus A by PCR: NOT DETECTED
Salmonella by PCR: NOT DETECTED
Shigella by PCR: NOT DETECTED

## 2014-08-31 MED ORDER — AMLODIPINE BESYLATE 5 MG PO TABS
5.0000 mg | ORAL_TABLET | Freq: Every day | ORAL | Status: DC
  Filled 2014-08-31: qty 1

## 2014-08-31 MED ORDER — AMLODIPINE BESYLATE 5 MG PO TABS: 5.0000 mg | ORAL_TABLET | Freq: Every day | ORAL | Status: DC

## 2014-08-31 NOTE — Progress Notes (Signed)
Subjective: No further pain currently, up and down all night to void.  No diarrhea.  No dizziness  Objective: Vital signs in last 24 hours: Temp:  [97.8 F (36.6 C)-98.4 F (36.9 C)] 97.8 F (36.6 C) (04/08 0854) Pulse Rate:  [54-66] 63 (04/08 0854) Resp:  [16-18] 18 (04/08 0854) BP: (157-178)/(66-100) 171/72 mmHg (04/08 0854) SpO2:  [95 %-100 %] 100 % (04/08 0854) Weight:  [194 lb 6.4 oz (88.179 kg)] 194 lb 6.4 oz (88.179 kg) (04/07 2145) Weight change: 2.8 oz (0.079 kg) Last BM Date: 08/29/14 Intake/Output from previous day: 04/07 0701 - 04/08 0700 In: 2097.5 [P.O.:720; I.V.:1377.5] Out: 2375 [Urine:2375] Intake/Output this shift: Total I/O In: 400 [P.O.:400] Out: 551 [Urine:550; Stool:1]  PE: General:Pleasant affect, NAD Skin:Warm and dry, brisk capillary refill HEENT:normocephalic, sclera clear, mucus membranes moist Neck:supple, no JVD, no bruits  Heart:S1S2 RRR without murmur, gallup, rub or click Lungs:clear without rales, rhonchi, or wheezes ZOX:WRUEAbd:soft, non tender, + BS, do not palpate liver spleen or masses Ext:no lower ext edema, 2+ pedal pulses, 2+ radial pulses Neuro:alert and oriented, MAE, follows commands, + facial symmetry Tele:  SR to SB in 50s, one 4 beat run of WC rhythm, rate 70     Lab Results:  Recent Labs  08/29/14 0535 08/30/14 0621  WBC 7.8 7.6  HGB 10.9* 10.8*  HCT 32.3* 32.4*  PLT 246 258   BMET  Recent Labs  08/30/14 0621 08/31/14 0355  NA 142 143  K 3.3* 3.9  CL 108 108  CO2 25 27  GLUCOSE 108* 120*  BUN 6 9  CREATININE 0.98 1.05  CALCIUM 9.1 9.0    Recent Labs  08/29/14 1535 08/30/14 0621  TROPONINI 0.05* 0.04*    Lab Results  Component Value Date   CHOL 134 05/08/2014   HDL 36* 05/08/2014   LDLCALC 80 05/08/2014   TRIG 91 05/08/2014   CHOLHDL 3.7 05/08/2014   Lab Results  Component Value Date   HGBA1C 8.7* 05/07/2014     No results found for: TSH  Hepatic Function Panel  Recent Labs  08/29/14 0535  PROT 5.8*  ALBUMIN 3.1*  AST 23  ALT 13  ALKPHOS 79  BILITOT 0.5   No results for input(s): CHOL in the last 72 hours. No results for input(s): PROTIME in the last 72 hours.     Studies/Results: Echo: Left ventricle: The cavity size was normal. Wall thickness was increased in a pattern of mild LVH. Systolic function was normal. The estimated ejection fraction was in the range of 55% to 60%. Wall motion was normal; there were no regional wall motion abnormalities. Doppler parameters are consistent with abnormal left ventricular relaxation (grade 1 diastolic dysfunction).   Medications: I have reviewed the patient's current medications. Scheduled Meds: . aspirin EC  81 mg Oral Daily  . atorvastatin  80 mg Oral QHS  . cholecalciferol  1,000 Units Oral Daily  . doxycycline  100 mg Oral Q12H  . gabapentin  300 mg Oral BID   And  . gabapentin  900 mg Oral QHS  . heparin  5,000 Units Subcutaneous 3 times per day  . insulin aspart  0-9 Units Subcutaneous TID WC  . insulin glargine  20 Units Subcutaneous Daily  . metoprolol tartrate  37.5 mg Oral BID  . mirtazapine  7.5 mg Oral QHS  . nicotine  21 mg Transdermal Daily  . pantoprazole  40 mg Oral Daily  . PARoxetine  40 mg Oral Daily  . senna-docusate  2 tablet Oral QHS  . sodium chloride  3 mL Intravenous Q12H   Continuous Infusions: . sodium chloride 50 mL/hr at 08/31/14 0400   PRN Meds:.acetaminophen **OR** acetaminophen, albuterol, alum & mag hydroxide-simeth, flunisolide, hydrALAZINE, HYDROcodone-acetaminophen, hydrOXYzine, morphine injection, nitroGLYCERIN, temazepam  Assessment/Plan: Principal Problem:  Diarrhea - unclear etiology, thought to be secondary to viral gastroenteritis improved  - c. diff negative, GI pathogen panel pending - symptoms improved - per IM  Active Problems: Chest pain / Elevated troponin/  Has episodic chest pain at home, plan outpt exercise myoview.   -  patient reports chest pain yesterday, was relieved without medications - mild elevation in troponin with flat trend (0.04, 0.05, 0.04) - continue ASA 81 mg daily, statin, BB  - plan for outpt stress test unless chest pain returns.   Syncope - attributed to dehydration in the context of possible viral etiology of diarrhea/nausea/vomiting - chlorthalidone held on admission - ECG with sinus bradycardia - orthostatic vitals were positive on admission  Hypertension - home chlorthalidone held given syncope  - continue lopressor 37.5 BID  - now hypertensive w/ SBP 160s -170s  Would add amlodipine  Hyperlipidemia - continue home statin - per IM  Dehydration  -2541 today - PO intake improved - per IM   COPD (chronic obstructive pulmonary disease) - stable, no evidence of acute exacerbation - inhaler/nebs PRN - per IM  Diabetes mellitus - glucose control per IM  Tobacco abuse -nicotine patch  Hypokalemia - s/p 40 meq yesterday - recheck 3.9 today    LOS: 2 days   Time spent with pt. :15 minutes. [facility]  Nurse Practitioner Certified Pager [phone] or after 5pm and on weekends call [phone] 08/31/2014, 9:15 AM

## 2014-08-31 NOTE — Discharge Instructions (Signed)
Fatigue Fatigue is a feeling of tiredness, lack of energy, lack of motivation, or feeling tired all the time. Having enough rest, good nutrition, and reducing stress will normally reduce fatigue. Consult your caregiver if it persists. The nature of your fatigue will help your caregiver to find out its cause. The treatment is based on the cause.  CAUSES  There are many causes for fatigue. Most of the time, fatigue can be traced to one or more of your habits or routines. Most causes fit into one or more of three general areas. They are: Lifestyle problems  Sleep disturbances.  Overwork.  Physical exertion.  Unhealthy habits.  Poor eating habits or eating disorders.  Alcohol and/or drug use .  Lack of proper nutrition (malnutrition). Psychological problems  Stress and/or anxiety problems.  Depression.  Grief.  Boredom. Medical Problems or Conditions  Anemia.  Pregnancy.  Thyroid gland problems.  Recovery from major surgery.  Continuous pain.  Emphysema or asthma that is not well controlled  Allergic conditions.  Diabetes.  Infections (such as mononucleosis).  Obesity.  Sleep disorders, such as sleep apnea.  Heart failure or other heart-related problems.  Cancer.  Kidney disease.  Liver disease.  Effects of certain medicines such as antihistamines, cough and cold remedies, prescription pain medicines, heart and blood pressure medicines, drugs used for treatment of cancer, and some antidepressants. SYMPTOMS  The symptoms of fatigue include:   Lack of energy.  Lack of drive (motivation).  Drowsiness.  Feeling of indifference to the surroundings. DIAGNOSIS  The details of how you feel help guide your caregiver in finding out what is causing the fatigue. You will be asked about your present and past health condition. It is important to review all medicines that you take, including prescription and non-prescription items. A thorough exam will be done.  You will be questioned about your feelings, habits, and normal lifestyle. Your caregiver may suggest blood tests, urine tests, or other tests to look for common medical causes of fatigue.  TREATMENT  Fatigue is treated by correcting the underlying cause. For example, if you have continuous pain or depression, treating these causes will improve how you feel. Similarly, adjusting the dose of certain medicines will help in reducing fatigue.  HOME CARE INSTRUCTIONS   Try to get the required amount of good sleep every night.  Eat a healthy and nutritious diet, and drink enough water throughout the day.  Practice ways of relaxing (including yoga or meditation).  Exercise regularly.  Make plans to change situations that cause stress. Act on those plans so that stresses decrease over time. Keep your work and personal routine reasonable.  Avoid street drugs and minimize use of alcohol.  Start taking a daily multivitamin after consulting your caregiver. SEEK MEDICAL CARE IF:   You have persistent tiredness, which cannot be accounted for.  You have fever.  You have unintentional weight loss.  You have headaches.  You have disturbed sleep throughout the night.  You are feeling sad.  You have constipation.  You have dry skin.  You have gained weight.  You are taking any new or different medicines that you suspect are causing fatigue.  You are unable to sleep at night.  You develop any unusual swelling of your legs or other parts of your body. SEEK IMMEDIATE MEDICAL CARE IF:   You are feeling confused.  Your vision is blurred.  You feel faint or pass out.  You develop severe headache.  You develop severe abdominal, pelvic, or  back pain.  You develop chest pain, shortness of breath, or an irregular or fast heartbeat.  You are unable to pass a normal amount of urine.  You develop abnormal bleeding such as bleeding from the rectum or you vomit blood.  You have thoughts  about harming yourself or committing suicide.  You are worried that you might harm someone else. MAKE SURE YOU:   Understand these instructions.  Will watch your condition.  Will get help right away if you are not doing well or get worse. Document Released: 03/08/2007 Document Revised: 08/03/2011 Document Reviewed: 09/12/2013 [facility] Patient Information 2015 [employer] [place]. This information is not intended to replace advice given to you by your health care provider. Make sure you discuss any questions you have with your health care provider.   You are scheduled for an exercise stress test, in Dr. Elvis CoilJordan's 09/11/14 at 1:45 pm, no food or liquid after 9:00AM.  No caffeine for 24 hours before test.  No cologne or strong deodorant.

## 2014-08-31 NOTE — Care Management Note (Signed)
CARE MANAGEMENT NOTE 08/31/2014  Patient:  [name] Parks Parks,[name] Parks   Account Number:  [id number]  Date Initiated:  08/31/2014  Documentation initiated by:  Cardell Rachel  Subjective/Objective Assessment:   CM following for progression and d/c planning.     Action/Plan:   No d/c needs identified at this time.   Anticipated DC Date:  08/31/2014   Anticipated DC Plan:  HOME/SELF CARE         Choice offered to / List presented to:             Status of service:  Completed, signed off Medicare Important Message given?  YES (If response is "NO", the following Medicare IM given date fields will be blank) Date Medicare IM given:  08/31/2014 Medicare IM given by:  Melanni Benway Date Additional Medicare IM given:   Additional Medicare IM given by:    Discharge Disposition:  HOME/SELF CARE  Per UR Regulation:    If discussed at Long Length of Stay Meetings, dates discussed:    Comments:

## 2014-09-03 NOTE — Discharge Summary (Signed)
Physician Discharge Summary  [name] Parks ZOX:[id number] DOB: [birth year] DOA: 08/28/2014  PCP: Caroline Sauger, MD  Admit date: 08/28/2014 Discharge date: 08/31/2014  Time spent: 30 minutes  Recommendations for Outpatient Follow-up:  1. Follow up with PCP in one week  Discharge Diagnoses:  Principal Problem:   Diarrhea Active Problems:   Diabetes mellitus   Hypertension   Hyperlipidemia   Tobacco abuse   Elevated troponin   Dehydration   COPD (chronic obstructive pulmonary disease)   Syncope   Diabetes mellitus without complication   Near syncope   Atypical chest pain   Discharge Condition: improved  Diet recommendation: carb modified diet  Filed Weights   08/29/14 0728 08/29/14 2120 08/30/14 2145  Weight: 88.1 kg (194 lb 3.6 oz) 88.2 kg (194 lb 7.1 oz) 88.179 kg (194 lb 6.4 oz)    History of present illness:  69 y.o. male with HTN, HLD, GERD, DM, , hyperlipidemia, GERD, PTSD, diabetes mellitus, OSA on CPAP, migraine headaches, COPD, who presented with main concern of 2 days duration of progressively worsening nausea, non bloody vomiting, diarrhea and syncopal event lasting 1- 2 minutes and witnessed by his wife. His symptoms of nausea, vomiting and diarrhea has resolved. He reports occasional chest pain, and he was found to have elevated troponins.   Hospital Course:  Nausea, vomiting, diarrhea - unclear etiology at this time and thought to be of viral etiology, viral gastroenteritis - stool panel by PCR requested And is pending. c diff negative  - for now continue hydration until oral intake improves - advanced diet as pt able to tolerate -   Syncope - likely secondary to dehydration in the setting of the above  - slevated cardiac enzymes, EKG NSR, cardiology consulted and recommendations given.   Leukocytosis - secondary to the principal problem - WBC is now WNL .   Right groin area boil - with mild drainage - place on doxycycline for now  Mild elevation in  troponins - could be secondary to the above, demand ischemia - 2 D ECHO ordered,. Cardiology consulted and recommended outpatient stress test and smoking cessation.   Diabetes mellitus - A1c was 8.7 on 05/07/14. Patient is on glipizide, metformin and Lantus at home.  CBG (last 3)   Recent Labs (last 2 labs)      Recent Labs  08/30/14 0715 08/30/14 1130 08/30/14 1634  GLUCAP 112* 190* 95      Resume SSI AND LANTUS.   Hypokalemia - repleted as needed.   Hypertension: -better controlled. Repeat bp is 158/85 mmhg on manual   OSA - provide CPAP at night time   COPD - Stable. No signs of acute exacerbation - provide BD's as needed   Tobacco abuse - Nicotine patch allowed       Procedures:    Consultations:  cardiology  Discharge Exam: Filed Vitals:   08/31/14 0854  BP: 171/72  Pulse: 63  Temp: 97.8 F (36.6 C)  Resp: 18    General: alert afebrile comfortable Cardiovascular: s1s2 Respiratory: ctab  Discharge Instructions   Discharge Instructions    Diet - low sodium heart healthy    Complete by:  As directed      Discharge instructions    Complete by:  As directed   Follow up with PCP and cardiologist as recommended.          Discharge Medication List as of 08/31/2014  3:25 PM    START taking these medications   Details  amLODipine (NORVASC) 5 MG tablet  Take 1 tablet (5 mg total) by mouth daily., Starting 08/31/2014, Until Discontinued, Print      CONTINUE these medications which have NOT CHANGED   Details  albuterol (PROVENTIL HFA;VENTOLIN HFA) 108 (90 BASE) MCG/ACT inhaler Inhale 2 puffs into the lungs every 4 (four) hours as needed for wheezing or shortness of breath., Starting 05/08/2014, Until Discontinued, Print    aspirin EC 81 MG tablet Take 81 mg by mouth daily., Until Discontinued, Historical Med    atorvastatin (LIPITOR) 80 MG tablet Take 80 mg by mouth at bedtime., Until Discontinued, Historical Med    cholecalciferol  (VITAMIN D) 1000 UNITS tablet Take 1,000 Units by mouth daily., Until Discontinued, Historical Med    flunisolide (NASALIDE) 25 MCG/ACT (0.025%) SOLN Place 2 sprays into the nose 2 (two) times daily as needed (for sinuses)., Until Discontinued, Historical Med    gabapentin (NEURONTIN) 300 MG capsule Take 300-900 mg by mouth 3 (three) times daily. Take 300 mg every morning and at 2 pm and take 900 mg at bedtime, Until Discontinued, Historical Med    HYDROcodone-acetaminophen (NORCO) 10-325 MG per tablet Take 1 tablet by mouth 4 (four) times daily as needed (for pain)., Until Discontinued, Historical Med    insulin aspart (NOVOLOG) 100 UNIT/ML injection Inject 6 Units into the skin 3 (three) times daily with meals., Starting 10/20/2013, Until Discontinued, Print    insulin glargine (LANTUS) 100 UNIT/ML injection Inject 0.25 mLs (25 Units total) into the skin at bedtime., Starting 10/20/2013, Until Discontinued, Print    metFORMIN (GLUCOPHAGE) 500 MG tablet Take 500 mg by mouth 3 (three) times daily., Until Discontinued, Historical Med    metoprolol tartrate (LOPRESSOR) 25 MG tablet Take 37.5 mg by mouth 2 (two) times daily., Until Discontinued, Historical Med    mirtazapine (REMERON) 15 MG tablet Take 7.5 mg by mouth at bedtime., Until Discontinued, Historical Med    omeprazole (PRILOSEC) 20 MG capsule Take 20 mg by mouth 2 (two) times daily., Until Discontinued, Historical Med    PARoxetine (PAXIL) 40 MG tablet Take 40 mg by mouth daily., Until Discontinued, Historical Med    potassium chloride SA (K-DUR,KLOR-CON) 20 MEQ tablet Take 20 mEq by mouth daily., Until Discontinued, Historical Med    senna-docusate (SENOKOT-S) 8.6-50 MG per tablet Take 2 tablets by mouth at bedtime., Until Discontinued, Historical Med    temazepam (RESTORIL) 30 MG capsule Take 30 mg by mouth at bedtime as needed for sleep., Until Discontinued, Historical Med    glipiZIDE (GLUCOTROL) 5 MG tablet Take by mouth 2  (two) times daily., Until Discontinued, Historical Med    terazosin (HYTRIN) 10 MG capsule Take 10 mg by mouth at bedtime., Until Discontinued, Historical Med      STOP taking these medications     chlorthalidone (HYGROTON) 25 MG tablet      naproxen (NAPROSYN) 375 MG tablet        Allergies  Allergen Reactions  . Paxil [Paroxetine Hcl]     [place] ([place] med)  . Trazodone And Nefazodone   . Wellbutrin [Bupropion]    Follow-up Information    Follow up with [name],INDIA, MD In 2 days.   Specialty:  Internal Medicine   Contact information:   [address] [place] [address] [phone]       Follow up with Peter SwazilandJordan, MD On 09/24/2014.   Specialty:  Cardiology   Why:  at 9:00 AM with BryanHager, PA for Dr. SwazilandJordan  Contact information:   [address] [place] [address] [phone]        The results of significant diagnostics from this hospitalization (including imaging, microbiology, ancillary and laboratory) are listed below for reference.    Significant Diagnostic Studies: Dg Chest 2 View  08/28/2014   CLINICAL DATA:  Sweating with emesis and weakness  EXAM: CHEST  2 VIEW  COMPARISON:  May 07, 2014  FINDINGS: There is underlying emphysematous change. Areas of interstitial fibrotic type change remain stable. There is no edema or consolidation. Heart size is normal. The pulmonary vascularity is stable and reflects the underlying emphysematous change. No adenopathy. There is mid thoracic dextroscoliosis. There is mild anterior wedging of several mid thoracic vertebral bodies.  IMPRESSION: Underlying emphysematous change with areas of interstitial fibrosis. No edema or consolidation. No new opacity compared to prior study.   Electronically Signed   By: Bretta Bang III M.D.   On: 08/28/2014 15:57   Ct Head Wo Contrast  08/28/2014   CLINICAL DATA:  Intermittent syncopal episodes preceded by headaches for  the past year.  EXAM: CT HEAD WITHOUT CONTRAST  TECHNIQUE: Contiguous axial images were obtained from the base of the skull through the vertex without intravenous contrast.  COMPARISON:  None.  FINDINGS: Diffusely enlarged ventricles and subarachnoid spaces. Patchy white matter low density in both cerebral hemispheres. No intracranial hemorrhage, mass lesion or CT evidence of acute infarction. Unremarkable bones and included paranasal sinuses. Atheromatous arterial calcifications at the skull base.  IMPRESSION: 1. No acute abnormality. 2. Mild diffuse cerebral atrophy. 3. Mild chronic small vessel white matter ischemic changes in both cerebral hemispheres.   Electronically Signed   By: Beckie Salts M.D.   On: 08/28/2014 19:32   Ct Abdomen Pelvis W Contrast  08/28/2014   CLINICAL DATA:  Left lower quadrant pain, nausea/vomiting/diarrhea  EXAM: CT ABDOMEN AND PELVIS WITH CONTRAST  TECHNIQUE: Multidetector CT imaging of the abdomen and pelvis was performed using the standard protocol following bolus administration of intravenous contrast.  CONTRAST:  OMNIPAQUE IOHEXOL 300 MG/ML  SOLN  COMPARISON:  None.  FINDINGS: Lower chest:  Emphysematous changes at the lung bases.  Hepatobiliary: 10 mm cyst in the left hepatic dome (series 3/image 11).  Gallbladder is unremarkable. No intrahepatic or extrahepatic ductal dilatation.  Pancreas: Within normal limits.  Spleen: Within normal limits.  Adrenals/Urinary Tract: 3.0 x 2.2 cm left adrenal nodule (series 3/image 20), statistically likely reflecting an adrenal adenoma, although technically indeterminate.  Right adrenal gland is within normal limits.  3.3 cm lateral interpolar left renal cyst (series 3/ image 31).  Right kidney is within normal limits.  No hydronephrosis.  Bladder is thick-walled.  Stomach/Bowel: Stomach is within normal limits.  No evidence of bowel obstruction.  Normal appendix.  Vascular/Lymphatic: Atherosclerotic calcifications of the abdominal aorta  and branch vessels.  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  No suspicious abdominopelvic lymphadenopathy.  Reproductive: Mild prostatomegaly.  Other: Small fat containing left inguinal hernia.  Musculoskeletal: Mild degenerative changes of the visualized thoracolumbar spine.  IMPRESSION: No evidence of bowel obstruction.  Normal appendix.  Thick-walled bladder, possibly reflecting chronic bladder outlet obstruction.  Small fat containing left inguinal hernia.  No CT findings to account for the patient's left lower quadrant abdominal pain.  3.0 cm left adrenal nodule, statistically reflecting an adrenal adenoma, although technically indeterminate. Consider MRI abdomen without contrast for further characterization as clinically warranted.  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  Additional  ancillary findings as above.   Electronically Signed   By: Charline Bills M.D.   On: 08/28/2014 19:36    Microbiology: Recent Results (from the past 240 hour(s))  Clostridium Difficile by PCR     Status: None   Collection Time: 08/29/14 11:49 AM  Result Value Ref Range Status   C difficile by pcr NEGATIVE NEGATIVE Final     Labs: Basic Metabolic Panel:  Recent Labs Lab 08/28/14 1537 08/29/14 0535 08/30/14 0621 08/31/14 0355  NA 142 139 142 143  K 3.7 3.2* 3.3* 3.9  CL 106 103 108 108  CO2 GLUCOSE 89 151* 108* 120*  BUN CREATININE 1.09 1.04 0.98 1.05  CALCIUM 9.3 9.0 9.1 9.0   Liver Function Tests:  Recent Labs Lab 08/28/14 1537 08/29/14 0535  AST 25 23  ALT 15 13  ALKPHOS 80 79  BILITOT 0.5 0.5  PROT 6.3 5.8*  ALBUMIN 3.4* 3.1*    Recent Labs Lab 08/28/14 1537  LIPASE 31   No results for input(s): AMMONIA in the last 168 hours. CBC:  Recent Labs Lab 08/28/14 1537 08/29/14 0535 08/30/14 0621  WBC 10.7* 7.8 7.6  NEUTROABS 7.3  --   --   HGB 11.2* 10.9* 10.8*  HCT 33.6* 32.3* 32.4*  MCV 82.0 82.6 82.0  PLT 255 246 258   Cardiac  Enzymes:  Recent Labs Lab 08/28/14 2220 08/29/14 0535 08/29/14 0937 08/29/14 1535 08/30/14 0621  TROPONINI 0.04* 0.05* 0.04* 0.05* 0.04*   BNP: BNP (last 3 results) No results for input(s): BNP in the last 8760 hours.  ProBNP (last 3 results)  Recent Labs  10/17/13 1407 05/07/14 1308  PROBNP 53.6 123.9    CBG:  Recent Labs Lab 08/30/14 1130 08/30/14 1634 08/30/14 2146 08/31/14 0801 08/31/14 1220  GLUCAP 190* 95 181* 131* 187*       Signed:  Hellon Vaccarella  [facility] 09/03/2014, 8:13 AM

## 2014-09-06 ENCOUNTER — Telehealth (HOSPITAL_COMMUNITY): Payer: Self-pay

## 2014-09-06 NOTE — Telephone Encounter (Signed)
Encounter complete. 

## 2014-09-11 ENCOUNTER — Ambulatory Visit (HOSPITAL_COMMUNITY)
Admit: 2014-09-11 | Discharge: 2014-09-11 | Disposition: A | Discharge status: Home / Self Care | Payer: Medicare Other | Source: Ambulatory Visit | Attending: Cardiology | Admitting: Cardiology

## 2014-09-11 DIAGNOSIS — R55 Syncope and collapse: Secondary | ICD-10-CM | POA: Insufficient documentation

## 2014-09-11 DIAGNOSIS — Z720 Tobacco use: Secondary | ICD-10-CM | POA: Insufficient documentation

## 2014-09-11 DIAGNOSIS — R0609 Other forms of dyspnea: Secondary | ICD-10-CM | POA: Insufficient documentation

## 2014-09-11 DIAGNOSIS — Z794 Long term (current) use of insulin: Secondary | ICD-10-CM | POA: Insufficient documentation

## 2014-09-11 DIAGNOSIS — R079 Chest pain, unspecified: Secondary | ICD-10-CM | POA: Insufficient documentation

## 2014-09-11 DIAGNOSIS — E119 Type 2 diabetes mellitus without complications: Secondary | ICD-10-CM | POA: Insufficient documentation

## 2014-09-11 DIAGNOSIS — Z8249 Family history of ischemic heart disease and other diseases of the circulatory system: Secondary | ICD-10-CM | POA: Insufficient documentation

## 2014-09-11 DIAGNOSIS — R5383 Other fatigue: Secondary | ICD-10-CM | POA: Insufficient documentation

## 2014-09-11 DIAGNOSIS — R778 Other specified abnormalities of plasma proteins: Secondary | ICD-10-CM

## 2014-09-11 DIAGNOSIS — R7989 Other specified abnormal findings of blood chemistry: Secondary | ICD-10-CM

## 2014-09-11 DIAGNOSIS — I10 Essential (primary) hypertension: Secondary | ICD-10-CM | POA: Insufficient documentation

## 2014-09-11 DIAGNOSIS — E785 Hyperlipidemia, unspecified: Secondary | ICD-10-CM | POA: Insufficient documentation

## 2014-09-11 DIAGNOSIS — R0602 Shortness of breath: Secondary | ICD-10-CM | POA: Insufficient documentation

## 2014-09-11 MED ORDER — REGADENOSON 0.4 MG/5ML IV SOLN
0.4000 mg | Freq: Once | INTRAVENOUS | Status: AC
  Administered 2014-09-11: 0.4 mg via INTRAVENOUS

## 2014-09-11 MED ORDER — TECHNETIUM TC 99M SESTAMIBI GENERIC - CARDIOLITE
30.3000 | Freq: Once | INTRAVENOUS | Status: AC | PRN
  Administered 2014-09-11: 30.3 via INTRAVENOUS

## 2014-09-11 MED ORDER — TECHNETIUM TC 99M SESTAMIBI GENERIC - CARDIOLITE
10.9000 | Freq: Once | INTRAVENOUS | Status: AC | PRN
  Administered 2014-09-11: 10.9 via INTRAVENOUS

## 2014-09-11 MED ORDER — AMINOPHYLLINE 25 MG/ML IV SOLN
75.0000 mg | Freq: Once | INTRAVENOUS | Status: AC
  Administered 2014-09-11: 75 mg via INTRAVENOUS

## 2014-09-11 NOTE — Procedures (Addendum)
[facility] [address] [place] [address] [phone]  Cardiology Nuclear Med Study  [name] MatarLarry Parks is a 69 y.o. male     MRN : [id number]     DOB: [birth year]  Procedure Date: 09/11/2014  Nuclear Med Background Indication for Stress Test:  Evaluation for Ischemia and Post Hospital History:  COPD and No prior NUC MPI for comparison;No prior cardiac history reported. Cardiac Risk Factors: Family History - CAD, Hypertension, IDDM Type 2, Lipids and Smoker  Symptoms:  Chest Pain, DOE, Fatigue, SOB and Syncope   Nuclear Pre-Procedure Caffeine/Decaff Intake:  1:30am NPO After: 9:00am   IV Site: R Forearm  IV 0.9% NS with Angio Cath:  22g  Chest Size (in):  40"  IV Started by: Berdie OgrenAmanda Wease, RN  Height: 5\' 11"  (1.803 m)  Cup Size: n/a  BMI:  Body mass index is 27.07 kg/(m^2). Weight:  194 lb (87.998 kg)   Tech Comments:  Patient couldn't walk, using cane, switched to Du PontLexiscan    Nuclear Med Study 1 or 2 day study: 1 day  Stress Test Type:  Lexiscan  Order Authorizing Provider:  Peter SwazilandJordan, MD   Resting Radionuclide: Technetium 10162m Sestamibi  Resting Radionuclide Dose: 10.9 mCi   Stress Radionuclide:  Technetium 8562m Sestamibi  Stress Radionuclide Dose: 30.3 mCi           Stress Protocol Rest HR: 88 Stress HR: 96  Rest BP: 150/90 Stress BP: 129/84  Exercise Time (min): n/a METS: n/a          Dose of Adenosine (mg):  n/a Dose of Lexiscan: 0.4 mg  Dose of Atropine (mg): n/a Dose of Dobutamine: n/a mcg/kg/min (at max HR)  Stress Test Technologist: Esperanza Sheetserry-Marie Martin, CCT Nuclear Technologist:Elizabeth Young,CNMT   Rest Procedure:  Myocardial perfusion imaging was performed at rest 45 minutes following the intravenous administration of Technetium 3662m Sestamibi. Stress Procedure:  The patient received IV Lexiscan 0.4 mg over 15-seconds.  Technetium 3962m Sestamibi injected IV at 30-seconds.  The patient experienced  SOB, Presyncope, Stomach discomfort and 75 mg Aminophylline IV was administered. There were no significant changes with Lexiscan.  Quantitative spect images were obtained after a 45 minute delay.  Transient Ischemic Dilatation (Normal <1.22):  1.22  QGS EDV:  86 ml QGS ESV:  42 ml LV Ejection Fraction: 51%      Rest ECG: NSR with non-specific ST-T wave changes  Stress ECG: No significant change from baseline ECG  QPS Raw Data Images:  Normal; no motion artifact; normal heart/lung ratio. Stress Images:  Small to medium size, mild mid to basal inferior/inferoseptal defect  Rest Images:  Small, mild basal inferoseptal defect Subtraction (SDS):  These findings are consistent with ischemia.  Impression Exercise Capacity:  Lexiscan with no exercise. BP Response:  Blunted BP response Clinical Symptoms:  Mild shortness of breath and lightheadedness ECG Impression:  No significant ST segment change suggestive of ischemia. Comparison with Prior Nuclear Study: No previous nuclear study performed  Overall Impression:  Intermediate risk stress nuclear study demonstrating a small to medium size mild region of ischemia in the mid-basal inferior wall.  LV Wall Motion:  Low normal LV Function, EF 51% with subtle distal septal inferior hypocontractility.   Lennette BihariKELLY,Garlen Reinig A, MD  09/11/2014 6:34 PM

## 2014-09-19 ENCOUNTER — Other Ambulatory Visit: Payer: Self-pay

## 2014-09-19 ENCOUNTER — Other Ambulatory Visit: Payer: Self-pay | Admitting: Cardiology

## 2014-09-19 DIAGNOSIS — R9439 Abnormal result of other cardiovascular function study: Secondary | ICD-10-CM

## 2014-09-19 DIAGNOSIS — R0789 Other chest pain: Secondary | ICD-10-CM

## 2014-09-19 DIAGNOSIS — R0602 Shortness of breath: Secondary | ICD-10-CM

## 2014-09-19 LAB — CBC WITH DIFFERENTIAL/PLATELET
BASOS ABS: 0.1 10*3/uL (ref 0.0–0.1)
BASOS PCT: 1 % (ref 0–1)
EOS ABS: 0.3 10*3/uL (ref 0.0–0.7)
Eosinophils Relative: 4 % (ref 0–5)
HCT: 35.9 % — ABNORMAL LOW (ref 39.0–52.0)
HEMOGLOBIN: 11.8 g/dL — AB (ref 13.0–17.0)
Lymphocytes Relative: 32 % (ref 12–46)
Lymphs Abs: 2.6 10*3/uL (ref 0.7–4.0)
MCH: 26.9 pg (ref 26.0–34.0)
MCHC: 32.9 g/dL (ref 30.0–36.0)
MCV: 82 fL (ref 78.0–100.0)
MPV: 10.2 fL (ref 8.6–12.4)
Monocytes Absolute: 0.7 10*3/uL (ref 0.1–1.0)
Monocytes Relative: 8 % (ref 3–12)
NEUTROS ABS: 4.5 10*3/uL (ref 1.7–7.7)
NEUTROS PCT: 55 % (ref 43–77)
PLATELETS: 302 10*3/uL (ref 150–400)
RBC: 4.38 MIL/uL (ref 4.22–5.81)
RDW: 16.4 % — AB (ref 11.5–15.5)
WBC: 8.2 10*3/uL (ref 4.0–10.5)

## 2014-09-19 LAB — BASIC METABOLIC PANEL
BUN: 17 mg/dL (ref 6–23)
CO2: 25 mEq/L (ref 19–32)
Calcium: 9.6 mg/dL (ref 8.4–10.5)
Chloride: 101 mEq/L (ref 96–112)
Creat: 1.19 mg/dL (ref 0.50–1.35)
GLUCOSE: 246 mg/dL — AB (ref 70–99)
Potassium: 4.1 mEq/L (ref 3.5–5.3)
Sodium: 136 mEq/L (ref 135–145)

## 2014-09-20 LAB — PROTIME-INR
INR: 1.15 (ref <1.50)
Prothrombin Time: 14.7 seconds (ref 11.6–15.2)

## 2014-09-21 ENCOUNTER — Encounter (HOSPITAL_COMMUNITY)
Admission: RE | Disposition: A | Discharge status: Home / Self Care | Payer: Self-pay | Source: Ambulatory Visit | Attending: Cardiology

## 2014-09-21 ENCOUNTER — Ambulatory Visit (HOSPITAL_COMMUNITY)
Admission: RE | Admit: 2014-09-21 | Discharge: 2014-09-21 | Disposition: A | Discharge status: Home / Self Care | Payer: Non-veteran care | Source: Ambulatory Visit | Attending: Cardiology | Admitting: Cardiology

## 2014-09-21 ENCOUNTER — Encounter (HOSPITAL_COMMUNITY): Payer: Self-pay | Admitting: Cardiology

## 2014-09-21 DIAGNOSIS — I2584 Coronary atherosclerosis due to calcified coronary lesion: Secondary | ICD-10-CM | POA: Insufficient documentation

## 2014-09-21 DIAGNOSIS — R9439 Abnormal result of other cardiovascular function study: Secondary | ICD-10-CM | POA: Diagnosis present

## 2014-09-21 DIAGNOSIS — Z7982 Long term (current) use of aspirin: Secondary | ICD-10-CM | POA: Insufficient documentation

## 2014-09-21 DIAGNOSIS — E876 Hypokalemia: Secondary | ICD-10-CM | POA: Insufficient documentation

## 2014-09-21 DIAGNOSIS — E860 Dehydration: Secondary | ICD-10-CM | POA: Diagnosis not present

## 2014-09-21 DIAGNOSIS — Z720 Tobacco use: Secondary | ICD-10-CM | POA: Diagnosis present

## 2014-09-21 DIAGNOSIS — R55 Syncope and collapse: Secondary | ICD-10-CM | POA: Diagnosis not present

## 2014-09-21 DIAGNOSIS — R778 Other specified abnormalities of plasma proteins: Secondary | ICD-10-CM | POA: Diagnosis present

## 2014-09-21 DIAGNOSIS — E119 Type 2 diabetes mellitus without complications: Secondary | ICD-10-CM | POA: Diagnosis not present

## 2014-09-21 DIAGNOSIS — I10 Essential (primary) hypertension: Secondary | ICD-10-CM | POA: Diagnosis not present

## 2014-09-21 DIAGNOSIS — E785 Hyperlipidemia, unspecified: Secondary | ICD-10-CM | POA: Diagnosis present

## 2014-09-21 DIAGNOSIS — G4733 Obstructive sleep apnea (adult) (pediatric): Secondary | ICD-10-CM | POA: Diagnosis not present

## 2014-09-21 DIAGNOSIS — Z96653 Presence of artificial knee joint, bilateral: Secondary | ICD-10-CM | POA: Insufficient documentation

## 2014-09-21 DIAGNOSIS — K219 Gastro-esophageal reflux disease without esophagitis: Secondary | ICD-10-CM | POA: Diagnosis not present

## 2014-09-21 DIAGNOSIS — F17200 Nicotine dependence, unspecified, uncomplicated: Secondary | ICD-10-CM | POA: Diagnosis not present

## 2014-09-21 DIAGNOSIS — R0789 Other chest pain: Secondary | ICD-10-CM | POA: Diagnosis present

## 2014-09-21 DIAGNOSIS — R7989 Other specified abnormal findings of blood chemistry: Secondary | ICD-10-CM | POA: Diagnosis present

## 2014-09-21 DIAGNOSIS — I2510 Atherosclerotic heart disease of native coronary artery without angina pectoris: Secondary | ICD-10-CM | POA: Insufficient documentation

## 2014-09-21 HISTORY — PX: LEFT HEART CATHETERIZATION WITH CORONARY ANGIOGRAM: SHX5451

## 2014-09-21 LAB — GLUCOSE, CAPILLARY
GLUCOSE-CAPILLARY: 196 mg/dL — AB (ref 70–99)
GLUCOSE-CAPILLARY: 171 mg/dL — AB (ref 70–99)

## 2014-09-21 SURGERY — LEFT HEART CATHETERIZATION WITH CORONARY ANGIOGRAM

## 2014-09-21 MED ORDER — ASPIRIN 81 MG PO CHEW: 81.0000 mg | CHEWABLE_TABLET | ORAL | Status: DC

## 2014-09-21 MED ORDER — METFORMIN HCL 500 MG PO TABS: 500.0000 mg | ORAL_TABLET | Freq: Three times a day (TID) | ORAL | Status: DC

## 2014-09-21 MED ORDER — HEPARIN (PORCINE) IN NACL 2-0.9 UNIT/ML-% IJ SOLN
INTRAMUSCULAR | Status: AC
  Filled 2014-09-21: qty 1500

## 2014-09-21 MED ORDER — SODIUM CHLORIDE 0.9 % IV SOLN: 250.0000 mL | INTRAVENOUS | Status: DC | PRN

## 2014-09-21 MED ORDER — SODIUM CHLORIDE 0.9 % IJ SOLN: 3.0000 mL | Freq: Two times a day (BID) | INTRAMUSCULAR | Status: DC

## 2014-09-21 MED ORDER — FENTANYL CITRATE (PF) 100 MCG/2ML IJ SOLN
INTRAMUSCULAR | Status: AC
  Filled 2014-09-21: qty 2

## 2014-09-21 MED ORDER — SODIUM CHLORIDE 0.9 % IV SOLN: 1.0000 mL/kg/h | INTRAVENOUS | Status: DC

## 2014-09-21 MED ORDER — SODIUM CHLORIDE 0.9 % IJ SOLN: 3.0000 mL | INTRAMUSCULAR | Status: DC | PRN

## 2014-09-21 MED ORDER — MIDAZOLAM HCL 2 MG/2ML IJ SOLN
INTRAMUSCULAR | Status: AC
  Filled 2014-09-21: qty 2

## 2014-09-21 MED ORDER — VERAPAMIL HCL 2.5 MG/ML IV SOLN
INTRAVENOUS | Status: AC
  Filled 2014-09-21: qty 2

## 2014-09-21 MED ORDER — LIDOCAINE HCL (PF) 1 % IJ SOLN
INTRAMUSCULAR | Status: AC
  Filled 2014-09-21: qty 30

## 2014-09-21 NOTE — H&P (View-Only) (Signed)
    CARDIOLOGY CONSULT NOTE   Patient ID: [name] Parks MRN: [id number] DOB/AGE: 10/07/1945 69 y.o.  Admit date: 08/28/2014  Primary Physician   [name],INDIA, MD Primary Cardiologist   New Reason for Consultation   Chest pain, elevated troponin  HPI:[name] Parks is a 69 y.o. year old male from [place] with a history of hypertension, hyperlipidemia, tobacco use, GERD, PTSD, diabetes mellitus, OSA on CPAP, migraine headaches, COPD, and anemia, who was admitted 08/28/2014 with N&V, diarrhea, and ?syncope. On admission, orthostatic VS were checked and SBP decreased from 146 (lying) to 117 (standing).  He was hospitalized 04/2014 for chest pain and dyspnea, felt secondary to musculoskeletal pain in the setting of a COPD exacerbation. He did not have a stress test or echocardiogram.  [name] reports that since he was hospitalized for PNA in May 2015 and has had intermittent episodes of pre-syncope since that time. He reports he "can feel it coming on" and becomes nauseous, diaphoretic, and lightheaded. He states this occurs when he stands up from a sitting position as well as while he is sitting at rest. No history of palpitations. The episodes are happening 1-2 x month, may be orthostatic in nature.   He also reports intermittent chest pain for the last few months, which he states occurs with and without his symptoms of pre-syncope and is unrelated to exertion. His chest pain is located in the left side of his chest, and typically lasts for 2-3 minutes, however, sometimes can last for 5-10 minutes. He reports his pain feels like pressure, and he denies precipitating or relieving factors. It is  6-7/10 at its worst.   He reports dyspnea with minimal exertion that has gotten worse over the last few months. His exertion is limited by SOB.    States he experienced chest pain today in the left side of his chest, which lasted for "a few minutes" and felt like pressure. He denies  shortness of breath and palpitations. Currently, Mr. [name] Parks reports his chest pain has resolved.     Past Medical History  Diagnosis Date  . PTSD (post-traumatic stress disorder)   . Hypertension   . Hyperlipidemia   . Type II diabetes mellitus   . CAP (community acquired pneumonia) 10/17/2013  . OSA on CPAP   . Migraines     "long time ago; none lately" (10/17/2013)  . COPD (chronic obstructive pulmonary disease)   . Shortness of breath dyspnea      Past Surgical History  Procedure Laterality Date  . Joint replacement    . Total knee arthroplasty Right ~ 2012  . Total knee arthroplasty Left ~ 2013  . Excisional hemorrhoidectomy  1970's?    Allergies  Allergen Reactions  . Paxil [Paroxetine Hcl]     [facility] reports [facility] (pt is prescribed this med)  . Trazodone And Nefazodone   . Wellbutrin [Bupropion]     I have reviewed the patient's current medications . aspirin EC  81 mg Oral Daily  . atorvastatin  80 mg Oral QHS  . cholecalciferol  1,000 Units Oral Daily  . doxycycline  100 mg Oral Q12H  . gabapentin  300 mg Oral BID   And  . gabapentin  900 mg Oral QHS  . heparin  5,000 Units Subcutaneous 3 times per day  . insulin aspart  0-9 Units Subcutaneous TID WC  . insulin glargine  20 Units Subcutaneous Daily  . metoprolol tartrate  37.5 mg Oral BID  . mirtazapine    7.5 mg Oral QHS  . nicotine  21 mg Transdermal Daily  . pantoprazole  40 mg Oral Daily  . PARoxetine  40 mg Oral Daily  . potassium chloride  40 mEq Oral BID  . senna-docusate  2 tablet Oral QHS  . sodium chloride  3 mL Intravenous Q12H   . sodium chloride 50 mL/hr at 08/30/14 0027   acetaminophen **OR** acetaminophen, albuterol, alum & mag hydroxide-simeth, flunisolide, hydrALAZINE, HYDROcodone-acetaminophen, hydrOXYzine, morphine injection, nitroGLYCERIN, temazepam  Medication Sig  albuterol (PROVENTIL HFA;VENTOLIN HFA) 108 (90 BASE) MCG/ACT inhaler Inhale 2 puffs into the lungs  every 4 (four) hours as needed for wheezing or shortness of breath.  aspirin EC 81 MG tablet Take 81 mg by mouth daily.  atorvastatin (LIPITOR) 80 MG tablet Take 80 mg by mouth at bedtime.  chlorthalidone (HYGROTON) 25 MG tablet Take 12.5 mg by mouth daily.  cholecalciferol (VITAMIN D) 1000 UNITS tablet Take 1,000 Units by mouth daily.  flunisolide (NASALIDE) 25 MCG/ACT (0.025%) SOLN Place 2 sprays into the nose 2 (two) times daily as needed (for sinuses).  gabapentin (NEURONTIN) 300 MG capsule Take 300-900 mg by mouth 3 (three) times daily. Take 300 mg every morning and at 2 pm and take 900 mg at bedtime  HYDROcodone-acetaminophen (NORCO) 10-325 MG per tablet Take 1 tablet by mouth 4 (four) times daily as needed (for pain).  insulin aspart (NOVOLOG) 100 UNIT/ML injection Inject 6 Units into the skin 3 (three) times daily with meals. Patient taking differently: Inject 4 Units into the skin 3 (three) times daily with meals. Pt uses sliding scale  insulin glargine (LANTUS) 100 UNIT/ML injection Inject 0.25 mLs (25 Units total) into the skin at bedtime. Patient taking differently: Inject 28 Units into the skin at bedtime.   metFORMIN (GLUCOPHAGE) 500 MG tablet Take 500 mg by mouth 3 (three) times daily.  metoprolol tartrate (LOPRESSOR) 25 MG tablet Take 37.5 mg by mouth 2 (two) times daily.  mirtazapine (REMERON) 15 MG tablet Take 7.5 mg by mouth at bedtime.  naproxen (NAPROSYN) 375 MG tablet Take 375 mg by mouth 2 (two) times daily as needed (for knee pain).  omeprazole (PRILOSEC) 20 MG capsule Take 20 mg by mouth 2 (two) times daily.  PARoxetine (PAXIL) 40 MG tablet Take 40 mg by mouth daily.  potassium chloride SA (K-DUR,KLOR-CON) 20 MEQ tablet Take 20 mEq by mouth daily.  senna-docusate (SENOKOT-S) 8.6-50 MG per tablet Take 2 tablets by mouth at bedtime.  temazepam (RESTORIL) 30 MG capsule Take 30 mg by mouth at bedtime as needed for sleep.  glipiZIDE (GLUCOTROL) 5 MG tablet Take by mouth 2  (two) times daily.  metoprolol (LOPRESSOR) 50 MG tablet Take 1 tablet (50 mg total) by mouth 2 (two) times daily.  terazosin (HYTRIN) 10 MG capsule Take 10 mg by mouth at bedtime.     History   Social History  . Marital Status: Married    Spouse Name: N/A  . Number of Children: N/A  . Years of Education: N/A   Occupational History  . Retired    Social History Main Topics  . Smoking status: Current Every Day Smoker -- 1.00 packs/day for 50 years    Types: Cigarettes  . Smokeless tobacco: Never Used  . Alcohol Use: Yes     Comment: "used to drink a long time ago; none since the 1970's"  . Drug Use: No  . Sexual Activity: Not Currently   Other Topics Concern  . Not on file   Social   History Narrative   Lives in [place]. Married.    Family Status  Relation Status Death Age  . Mother Deceased   . Father Deceased    Family History  Problem Relation Age of Onset  . Diabetes Mother   . Heart attack Father   . Diabetes Sister      ROS:  Full 14 point review of systems complete and found to be negative unless listed above.  Physical Exam: Blood pressure 160/65, pulse 61, temperature 97.6 F (36.4 C), temperature source Oral, resp. rate 17, height 5' 11" (1.803 m), weight 194 lb 7.1 oz (88.2 kg), SpO2 98 %.  General: Well developed, well nourished, male in no acute distress Head: Normocephalic and atraumatic, oropharynx without edema or exudate.  Lungs: Respiration unlabored. Clear to auscultation bilaterally. No wheezing, rales, or rhonchi. Heart: RRR S1 S2, no rub/gallop, no murmur. Pulses are 2+ all 4 extrem.   Neck: No carotid bruits. No JVD. Abdomen: Bowel sounds present, abdomen soft and non-tender without masses or hernias noted. Msk: No weakness, no joint deformities or effusions. Extremities: No clubbing or cyanosis. No edema.  Neuro: Alert and oriented X 3. No focal deficits noted. Psych: Good affect, responds appropriately Skin: No rashes or lesions  noted.  Labs:   Lab Results  Component Value Date   WBC 7.6 08/30/2014   HGB 10.8* 08/30/2014   HCT 32.4* 08/30/2014   MCV 82.0 08/30/2014   PLT 258 08/30/2014    Recent Labs  08/29/14 0535  INR 1.09     Recent Labs Lab 08/29/14 0535 08/30/14 0621  NA 139 142  K 3.2* 3.3*  CL 103 108  CO2 25 25  BUN 9 6  CREATININE 1.04 0.98  CALCIUM 9.0 9.1  PROT 5.8*  --   BILITOT 0.5  --   ALKPHOS 79  --   ALT 13  --   AST 23  --   GLUCOSE 151* 108*  ALBUMIN 3.1*  --    No results found for: MG  Recent Labs  08/29/14 0535 08/29/14 0937 08/29/14 1535 08/30/14 0621  TROPONINI 0.05* 0.04* 0.05* 0.04*    Recent Labs  08/28/14 1543 08/29/14 0137  TROPIPOC 0.00 0.00   Lab Results  Component Value Date   CHOL 134 05/08/2014   HDL 36* 05/08/2014   LDLCALC 80 05/08/2014   TRIG 91 05/08/2014   LIPASE  Date/Time Value Ref Range Status  08/28/2014 03:37 PM 31 11 - 59 U/L Final   Lab Results  Component Value Date   HGBA1C 8.7* 05/07/2014    ECG:  08/29/2014 Sinus bradycardia, rate 59, LDH  Radiology:  Dg Chest 2 View 08/28/2014   CLINICAL DATA:  Sweating with emesis and weakness  EXAM: CHEST  2 VIEW  COMPARISON:  May 07, 2014  FINDINGS: There is underlying emphysematous change. Areas of interstitial fibrotic type change remain stable. There is no edema or consolidation. Heart size is normal. The pulmonary vascularity is stable and reflects the underlying emphysematous change. No adenopathy. There is mid thoracic dextroscoliosis. There is mild anterior wedging of several mid thoracic vertebral bodies.  IMPRESSION: Underlying emphysematous change with areas of interstitial fibrosis. No edema or consolidation. No new opacity compared to prior study.   Electronically Signed   By: William  Woodruff III M.D.   On: 08/28/2014 15:57   Ct Head Wo Contrast 08/28/2014   CLINICAL DATA:  Intermittent syncopal episodes preceded by headaches for the past year.  EXAM: CT HEAD  WITHOUT   CONTRAST  TECHNIQUE: Contiguous axial images were obtained from the base of the skull through the vertex without intravenous contrast.  COMPARISON:  None.  FINDINGS: Diffusely enlarged ventricles and subarachnoid spaces. Patchy white matter low density in both cerebral hemispheres. No intracranial hemorrhage, mass lesion or CT evidence of acute infarction. Unremarkable bones and included paranasal sinuses. Atheromatous arterial calcifications at the skull base.  IMPRESSION: 1. No acute abnormality. 2. Mild diffuse cerebral atrophy. 3. Mild chronic small vessel white matter ischemic changes in both cerebral hemispheres.   Electronically Signed   By: Steven  [name] M.D.   On: 08/28/2014 19:32   Ct Abdomen Pelvis W Contrast 08/28/2014   CLINICAL DATA:  Left lower quadrant pain, nausea/vomiting/diarrhea  EXAM: CT ABDOMEN AND PELVIS WITH CONTRAST  TECHNIQUE: Multidetector CT imaging of the abdomen and pelvis was performed using the standard protocol following bolus administration of intravenous contrast.  CONTRAST:  100mL OMNIPAQUE IOHEXOL 300 MG/ML  SOLN  COMPARISON:  None.  FINDINGS: Lower chest:  Emphysematous changes at the lung bases.  Hepatobiliary: 10 mm cyst in the left hepatic dome (series 3/image 11).  Gallbladder is unremarkable. No intrahepatic or extrahepatic ductal dilatation.  Pancreas: Within normal limits.  Spleen: Within normal limits.  Adrenals/Urinary Tract: 3.0 x 2.2 cm left adrenal nodule (series 3/image 20), statistically likely reflecting an adrenal adenoma, although technically indeterminate.  Right adrenal gland is within normal limits.  3.3 cm lateral interpolar left renal cyst (series 3/ image 31).  Right kidney is within normal limits.  No hydronephrosis.  Bladder is thick-walled.  Stomach/Bowel: Stomach is within normal limits.  No evidence of bowel obstruction.  Normal appendix.  Vascular/Lymphatic: Atherosclerotic calcifications of the abdominal aorta and branch vessels.  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  No suspicious abdominopelvic lymphadenopathy.  Reproductive: Mild prostatomegaly.  Other: Small fat containing left inguinal hernia.  Musculoskeletal: Mild degenerative changes of the visualized thoracolumbar spine.  IMPRESSION: No evidence of bowel obstruction.  Normal appendix.  Thick-walled bladder, possibly reflecting chronic bladder outlet obstruction.  Small fat containing left inguinal hernia.  No CT findings to account for the patient's left lower quadrant abdominal pain.  3.0 cm left adrenal nodule, statistically reflecting an adrenal adenoma, although technically indeterminate. Consider MRI abdomen without contrast for further characterization as clinically warranted.  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  Additional ancillary findings as above.   Electronically Signed   By: Sriyesh  Krishnan M.D.   On: 08/28/2014 19:36    ASSESSMENT AND PLAN:   The patient was seen today by Dr Tiandra Swoveland, the patient evaluated and the data reviewed.   Principal Problem:   Diarrhea - unclear etiology, thought to be secondary to viral gastroenteritis - c. diff negative, GI pathogen panel pending - symptoms improved - per IM  Active Problems: Chest pain / Elevated troponin - patient reports chest pain today, now relieved without medications - mild elevation in troponin with flat trend (0.04, 0.05, 0.04) - continue ASA 81 mg daily, statin, BB   Syncope - attributed to dehydration in the context of possible viral etiology of diarrhea/nausea/vomiting - chlorthalidone held on admission - ECG with sinus bradycardia - orthostatic vitals were positive on admission  Hypertension - home chlorthalidone held given syncope  - continue lopressor 37.5 BID  - now hypertensive w/ SBP 140s-160s  Hyperlipidemia - continue home statin - per IM  Dehydration - PO intake improved - per IM    COPD (chronic obstructive pulmonary disease) - stable, no evidence   of acute  exacerbation - inhaler/nebs PRN - per IM  Diabetes mellitus - glucose control per IM  Tobacco abuse -nicotine patch  Hypokalemia - s/p 40 meq yesterday and today - recheck in am   Signed: Rhonda Barrett, PA-C 08/30/2014 3:06 PM Beeper [phone]  Co-Sign MD Patient seen and examined and history reviewed. Agree with above findings and plan. 69 yo BM with multiple cardiac risk factors admitted with near syncope and diarrhea. He was orthostatic on admission. He does describe intermittent dyspnea and chest pain for the past 6 months. This is random and not associated with exertion. No recent acceleration in symptoms. No prior stress testing. Ecg here shows possible old septal infarct. He is in NSR. Troponin slightly elevated with flat curve in nonspecific pattern. I don't think that his current issues are cardiac. Agree with holding diuretic. Given his multiple risk factors I do think it would be a good idea to do a nuclear stress test later as an outpatient once he has recovered from his acute illness. Recommend smoking cessation.  Ashna Dorough, MDFACC 08/30/2014 4:38 PM    

## 2014-09-21 NOTE — Interval H&P Note (Signed)
History and Physical Interval Note:  09/21/2014 9:46 AM  [name] Parks  has presented today for surgery, with the diagnosis of abnormal mioview/cp. Recent admission with near syncope and atypical chest pain. Multiple cardiac risk factors. Outpatient myoview abnormal with inferior ischemia. EF 51%. Intermediate risk.   The various methods of treatment have been discussed with the patient and family. After consideration of risks, benefits and other options for treatment, the patient has consented to  Procedure(s): LEFT HEART CATHETERIZATION WITH CORONARY ANGIOGRAM (N/A) as a surgical intervention .  The patient's history has been reviewed, patient examined, no change in status, stable for surgery.  I have reviewed the patient's chart and labs.  Questions were answered to the patient's satisfaction.     Theron Aristaeter [facility]ty SurgeryJordanMD,FACC 09/21/2014 9:47 AM

## 2014-09-21 NOTE — CV Procedure (Signed)
    Cardiac Catheterization Procedure Note  Name: [name] MatarLarry Parks MRN: [id number] DOB: [birth year]  Procedure: Left Heart Cath, Selective Coronary Angiography, LV angiography  Indication: 69 yo WM with multiple cardiac risk factors presents with atypical chest pain. Myoview study is intermediate risk with inferior ischemia.    Procedural Details: The right wrist was prepped, draped, and anesthetized with 1% lidocaine. Using the modified Seldinger technique, a 6 French slender sheath was introduced into the right radial artery. 3 mg of verapamil was administered through the sheath, weight-based unfractionated heparin was administered intravenously. Standard Judkins catheters were used for selective coronary angiography and left ventriculography. Catheter exchanges were performed over an exchange length guidewire. There were no immediate procedural complications. A TR band was used for radial hemostasis at the completion of the procedure.  The patient was transferred to the post catheterization recovery area for further monitoring.  Procedural Findings: Hemodynamics: AO 134/67 mean 93 mm Hg LV 135/17 mm Hg  Coronary angiography: Coronary dominance: right  Left mainstem: Normal  Left anterior descending (LAD): moderate calcification with 30% disease after the first diagonal. Otherwise normal.   Left circumflex (LCx): Tortuous with 20-30% disease in the proximal vessel. The 2 OM branches are normal.   Right coronary artery (RCA): The RCA is a dominant vessel and is heavily calcified throughout. There is 30% stenosis in the proximal vessel and segmental 40-50% stenosis in the mid vessel. There is mild disease in the PDA and PLOM branches.  Left ventriculography: Left ventricular systolic function is normal, LVEF is estimated at 55-65%, there is no significant mitral regurgitation   Final Conclusions:   1. Nonobstructive CAD 2. Normal LV function.  Recommendations: medical management and  risk factor modification.  Cherise Fedder [place], MDFACC  09/21/2014, 10:18 AM

## 2014-09-21 NOTE — Discharge Instructions (Signed)
Radial Site Care °Refer to this sheet in the next few weeks. These instructions provide you with information on caring for yourself after your procedure. Your caregiver may also give you more specific instructions. Your treatment has been planned according to current medical practices, but problems sometimes occur. Call your caregiver if you have any problems or questions after your procedure. °HOME CARE INSTRUCTIONS °· You may shower the day after the procedure. Remove the bandage (dressing) and gently wash the site with plain soap and water. Gently pat the site dry. °· Do not apply powder or lotion to the site. °· Do not submerge the affected site in water for 3 to 5 days. °· Inspect the site at least twice daily. °· Do not flex or bend the affected arm for 24 hours. °· No lifting over 5 pounds (2.3 kg) for 5 days after your procedure. °· Do not drive home if you are discharged the same day of the procedure. Have someone else drive you. °· You may drive 24 hours after the procedure unless otherwise instructed by your caregiver. °· Do not operate machinery or power tools for 24 hours. °· A responsible adult should be with you for the first 24 hours after you arrive home. °What to expect: °· Any bruising will usually fade within 1 to 2 weeks. °· Blood that collects in the tissue (hematoma) may be painful to the touch. It should usually decrease in size and tenderness within 1 to 2 weeks. °SEEK IMMEDIATE MEDICAL CARE IF: °· You have unusual pain at the radial site. °· You have redness, warmth, swelling, or pain at the radial site. °· You have drainage (other than a small amount of blood on the dressing). °· You have chills. °· You have a fever or persistent symptoms for more than 72 hours. °· You have a fever and your symptoms suddenly get worse. °· Your arm becomes pale, cool, tingly, or numb. °· You have heavy bleeding from the site. Hold pressure on the site and call 911. °Document Released: 06/13/2010 Document  Revised: 08/03/2011 Document Reviewed: 06/13/2010 °[employer]® Patient Information ©2015 [employer]. This information is not intended to replace advice given to you by your health care provider. Make sure you discuss any questions you have with your health care provider. ° °

## 2014-09-24 ENCOUNTER — Ambulatory Visit: Payer: Medicare Other | Admitting: Physician Assistant

## 2014-10-08 ENCOUNTER — Encounter: Payer: Self-pay | Admitting: Cardiology

## 2014-10-08 ENCOUNTER — Ambulatory Visit (INDEPENDENT_AMBULATORY_CARE_PROVIDER_SITE_OTHER): Payer: Non-veteran care | Admitting: Cardiology

## 2014-10-08 VITALS — BP 88/62 | HR 69 | Ht 71.0 in | Wt 200.2 lb

## 2014-10-08 DIAGNOSIS — I10 Essential (primary) hypertension: Secondary | ICD-10-CM | POA: Diagnosis not present

## 2014-10-08 DIAGNOSIS — R55 Syncope and collapse: Secondary | ICD-10-CM | POA: Diagnosis not present

## 2014-10-08 DIAGNOSIS — R0789 Other chest pain: Secondary | ICD-10-CM

## 2014-10-08 NOTE — Progress Notes (Signed)
Cardiology Office Note   Date:  10/08/2014   ID:  [name] Parks, DOB 01/30/1946, MRN [id number]  PCP:  Caroline Sauger, MD  Cardiologist:   Peter Swaziland, MD   Chief Complaint  Patient presents with  . Hospitalization Follow-up    Cath, pt pass out last thursday stated he got dizzy and don't remember anything      History of Present Illness: [name] Parks is a 69 y.o. male who presents for follow up after recent cardiac cath. He was hospitalized in April with N/V and syncope. He had slight elevation in his troponins. He had atypical chest pain. This led to a Myoview study that was intermediate risk. He then underwent cardiac cath that showed minor nonobstructive disease and normal LV function. On follow up he had no complications from procedure. He did have another syncopal episode this past Friday when his was urinating. No warning.     Past Medical History  Diagnosis Date  . PTSD (post-traumatic stress disorder)   . Hypertension   . Hyperlipidemia   . Type II diabetes mellitus   . CAP (community acquired pneumonia) 10/17/2013  . OSA on CPAP   . Migraines     "long time ago; none lately" (10/17/2013)  . COPD (chronic obstructive pulmonary disease)   . Shortness of breath dyspnea     Past Surgical History  Procedure Laterality Date  . Joint replacement    . Total knee arthroplasty Right ~ 2012  . Total knee arthroplasty Left ~ 2013  . Excisional hemorrhoidectomy  1970's?  . Left heart catheterization with coronary angiogram N/A 09/21/2014    Procedure: LEFT HEART CATHETERIZATION WITH CORONARY ANGIOGRAM;  Surgeon: Peter M Swaziland, MD;  Location: [facility] CATH LAB;  Service: Cardiovascular;  Laterality: N/A;     Current Outpatient Prescriptions  Medication Sig Dispense Refill  . albuterol (PROVENTIL HFA;VENTOLIN HFA) 108 (90 BASE) MCG/ACT inhaler Inhale 2 puffs into the lungs every 4 (four) hours as needed for wheezing or shortness of breath. 1 Inhaler 2  . amLODipine  (NORVASC) 5 MG tablet Take 1 tablet (5 mg total) by mouth daily. 30 tablet 1  . aspirin EC 81 MG tablet Take 81 mg by mouth daily.    Marland Kitchen atorvastatin (LIPITOR) 80 MG tablet Take 80 mg by mouth at bedtime.    . cholecalciferol (VITAMIN D) 1000 UNITS tablet Take 1,000 Units by mouth daily.    . flunisolide (NASALIDE) 25 MCG/ACT (0.025%) SOLN Place 2 sprays into the nose 2 (two) times daily as needed (for sinuses).    . gabapentin (NEURONTIN) 300 MG capsule Take 300-900 mg by mouth 3 (three) times daily. Take 300 mg every morning and at 2 pm and take 900 mg at bedtime    . HYDROcodone-acetaminophen (NORCO) 10-325 MG per tablet Take 1 tablet by mouth 4 (four) times daily as needed (for pain).    . insulin aspart (NOVOLOG) 100 UNIT/ML injection Inject 6 Units into the skin 3 (three) times daily with meals. (Patient taking differently: Inject 4 Units into the skin 3 (three) times daily with meals. Pt uses sliding scale) 10 mL 11  . insulin glargine (LANTUS) 100 UNIT/ML injection Inject 0.25 mLs (25 Units total) into the skin at bedtime. (Patient taking differently: Inject 28 Units into the skin at bedtime. ) 10 mL 11  . metFORMIN (GLUCOPHAGE) 500 MG tablet Take by mouth 2 (two) times daily with a meal.    . metoprolol tartrate (LOPRESSOR) 25 MG tablet Take 37.5 mg  by mouth 2 (two) times daily.    . mirtazapine (REMERON) 15 MG tablet Take 7.5 mg by mouth at bedtime.    . Multiple Vitamins-Minerals (MULTIVITAMIN WITH MINERALS) tablet Take 1 tablet by mouth daily.    Marland Kitchen. omeprazole (PRILOSEC) 20 MG capsule Take 20 mg by mouth 2 (two) times daily.    Marland Kitchen. PARoxetine (PAXIL) 40 MG tablet Take 40 mg by mouth daily.    . potassium chloride SA (K-DUR,KLOR-CON) 20 MEQ tablet Take 20 mEq by mouth daily.    Marland Kitchen. senna-docusate (SENOKOT-S) 8.6-50 MG per tablet Take 2 tablets by mouth at bedtime.    . temazepam (RESTORIL) 30 MG capsule Take 30 mg by mouth at bedtime as needed for sleep.    Marland Kitchen. terazosin (HYTRIN) 10 MG capsule  Take 10 mg by mouth at bedtime.     No current facility-administered medications for this visit.    Allergies:   Paxil; Trazodone and nefazodone; and Wellbutrin    Social History:  The patient  reports that he has been smoking Cigarettes.  He has a 50 pack-year smoking history. He has never used smokeless tobacco. He reports that he drinks alcohol. He reports that he does not use illicit drugs.   Family History:  The patient's family history includes Diabetes in his mother and sister; Heart attack in his father.    ROS:  Please see the history of present illness.   Otherwise, review of systems are positive for none.   All other systems are reviewed and negative.    PHYSICAL EXAM: VS:  BP 88/62 mmHg  Pulse 69  Ht 5\' 11"  (1.803 m)  Wt 200 lb 3.2 oz (90.81 kg)  BMI 27.93 kg/m2 , BMI Body mass index is 27.93 kg/(m^2). Repeat BP 78/40 GEN: Well nourished, well developed, in no acute distress HEENT: normal Neck: no JVD, carotid bruits, or masses Cardiac: RRR; no murmurs, rubs, or gallops,no edema  Respiratory:  clear to auscultation bilaterally, normal work of breathing GI: soft, nontender, nondistended, + BS MS: no deformity or atrophy, no radial site hematoma. Skin: warm and dry, no rash Neuro:  Strength and sensation are intact Psych: euthymic mood, full affect   EKG:  EKG is ordered today. The ekg ordered today demonstrates NSR with septal infarct- age undetermined. No acute change. I have personally reviewed and interpreted this study.    Recent Labs: 05/07/2014: Pro B Natriuretic peptide (BNP) 123.9 08/29/2014: ALT 13 09/19/2014: BUN 17; Creatinine 1.19; Hemoglobin 11.8*; Platelets 302; Potassium 4.1; Sodium 136    Lipid Panel    Component Value Date/Time   CHOL 134 05/08/2014 0040   TRIG 91 05/08/2014 0040   HDL 36* 05/08/2014 0040   CHOLHDL 3.7 05/08/2014 0040   VLDL 18 05/08/2014 0040   LDLCALC 80 05/08/2014 0040      Wt Readings from Last 3 Encounters:    10/08/14 200 lb 3.2 oz (90.81 kg)  09/11/14 194 lb (87.998 kg)  08/30/14 194 lb 6.4 oz (88.179 kg)      Cardiac Catheterization Procedure Note  Name: [name] MatarLarry Parks MRN: [id number] DOB: [birth year]  Procedure: Left Heart Cath, Selective Coronary Angiography, LV angiography  Indication: 69 yo WM with multiple cardiac risk factors presents with atypical chest pain. Myoview study is intermediate risk with inferior ischemia.   Procedural Details: The right wrist was prepped, draped, and anesthetized with 1% lidocaine. Using the modified Seldinger technique, a 6 French slender sheath was introduced into the right radial artery. 3 mg of verapamil  was administered through the sheath, weight-based unfractionated heparin was administered intravenously. Standard Judkins catheters were used for selective coronary angiography and left ventriculography. Catheter exchanges were performed over an exchange length guidewire. There were no immediate procedural complications. A TR band was used for radial hemostasis at the completion of the procedure. The patient was transferred to the post catheterization recovery area for further monitoring.  Procedural Findings: Hemodynamics: AO 134/67 mean 93 mm Hg LV 135/17 mm Hg  Coronary angiography: Coronary dominance: right  Left mainstem: Normal  Left anterior descending (LAD): moderate calcification with 30% disease after the first diagonal. Otherwise normal.   Left circumflex (LCx): Tortuous with 20-30% disease in the proximal vessel. The 2 OM branches are normal.   Right coronary artery (RCA): The RCA is a dominant vessel and is heavily calcified throughout. There is 30% stenosis in the proximal vessel and segmental 40-50% stenosis in the mid vessel. There is mild disease in the PDA and PLOM branches.  Left ventriculography: Left ventricular systolic function is normal, LVEF is estimated at 55-65%, there is no  significant mitral regurgitation   Final Conclusions:  1. Nonobstructive CAD 2. Normal LV function.  Recommendations: medical management and risk factor modification.  Peter SwazilandJordan, MDFACC  09/21/2014, 10:18 AM    ASSESSMENT AND PLAN:  1.  Syncope related to hypotension. Recommend stopping amlodipine now. Hold metoprolol today then resume. Monitor BP at home. If it remains low he will need to stop Hytrin.   2. Atypical chest pain. Cardiac cath was negative. Continue risk factor modification.   Current medicines are reviewed at length with the patient today.  The patient has concerns regarding medicines.  The following changes have been made:  Stop amlodipine  Labs/ tests ordered today include:  Orders Placed This Encounter  Procedures  . EKG 12-Lead     Disposition:   FU with Me prn  Signed, Peter SwazilandJordan, MD,FACC 10/08/2014 1:21 PM    [facility] [address], [place], [place], [address] Phone [phone], Fax [phone]

## 2014-10-08 NOTE — Patient Instructions (Signed)
Stop taking amlodipine  Don't take metoprolol this evening and then resume tomorrow if your BP is OK. If your blood pressure remains low let me or Dr. Azucena Kubaeid know.  I will see you as needed.

## 2016-05-03 ENCOUNTER — Emergency Department (HOSPITAL_COMMUNITY): Payer: Non-veteran care

## 2016-05-03 ENCOUNTER — Emergency Department (HOSPITAL_COMMUNITY)
Admission: EM | Admit: 2016-05-03 | Discharge: 2016-05-03 | Disposition: A | Discharge status: Home / Self Care | Payer: Non-veteran care | Attending: Emergency Medicine | Admitting: Emergency Medicine

## 2016-05-03 ENCOUNTER — Encounter (HOSPITAL_COMMUNITY): Payer: Self-pay | Admitting: Emergency Medicine

## 2016-05-03 DIAGNOSIS — I10 Essential (primary) hypertension: Secondary | ICD-10-CM | POA: Diagnosis not present

## 2016-05-03 DIAGNOSIS — Z794 Long term (current) use of insulin: Secondary | ICD-10-CM | POA: Diagnosis not present

## 2016-05-03 DIAGNOSIS — Z79899 Other long term (current) drug therapy: Secondary | ICD-10-CM | POA: Insufficient documentation

## 2016-05-03 DIAGNOSIS — F17210 Nicotine dependence, cigarettes, uncomplicated: Secondary | ICD-10-CM | POA: Insufficient documentation

## 2016-05-03 DIAGNOSIS — R55 Syncope and collapse: Secondary | ICD-10-CM | POA: Diagnosis not present

## 2016-05-03 DIAGNOSIS — J449 Chronic obstructive pulmonary disease, unspecified: Secondary | ICD-10-CM | POA: Diagnosis not present

## 2016-05-03 DIAGNOSIS — R0602 Shortness of breath: Secondary | ICD-10-CM | POA: Insufficient documentation

## 2016-05-03 DIAGNOSIS — Z7982 Long term (current) use of aspirin: Secondary | ICD-10-CM | POA: Diagnosis not present

## 2016-05-03 DIAGNOSIS — R0789 Other chest pain: Secondary | ICD-10-CM | POA: Insufficient documentation

## 2016-05-03 DIAGNOSIS — E119 Type 2 diabetes mellitus without complications: Secondary | ICD-10-CM | POA: Insufficient documentation

## 2016-05-03 DIAGNOSIS — Z96653 Presence of artificial knee joint, bilateral: Secondary | ICD-10-CM | POA: Insufficient documentation

## 2016-05-03 LAB — I-STAT VENOUS BLOOD GAS, ED
ACID-BASE DEFICIT: 1 mmol/L (ref 0.0–2.0)
BICARBONATE: 23.8 mmol/L (ref 20.0–28.0)
O2 SAT: 61 %
PO2 VEN: 32 mmHg (ref 32.0–45.0)
TCO2: 25 mmol/L (ref 0–100)
pCO2, Ven: 38.5 mmHg — ABNORMAL LOW (ref 44.0–60.0)
pH, Ven: 7.399 (ref 7.250–7.430)

## 2016-05-03 LAB — CBC WITH DIFFERENTIAL/PLATELET
BASOS PCT: 1 %
Basophils Absolute: 0.1 10*3/uL (ref 0.0–0.1)
Eosinophils Absolute: 0.1 10*3/uL (ref 0.0–0.7)
Eosinophils Relative: 1 %
HEMATOCRIT: 30.7 % — AB (ref 39.0–52.0)
Hemoglobin: 9.6 g/dL — ABNORMAL LOW (ref 13.0–17.0)
Lymphocytes Relative: 15 %
Lymphs Abs: 1.6 10*3/uL (ref 0.7–4.0)
MCH: 23.9 pg — ABNORMAL LOW (ref 26.0–34.0)
MCHC: 31.3 g/dL (ref 30.0–36.0)
MCV: 76.6 fL — ABNORMAL LOW (ref 78.0–100.0)
MONO ABS: 0.6 10*3/uL (ref 0.1–1.0)
MONOS PCT: 5 %
NEUTROS ABS: 8.1 10*3/uL — AB (ref 1.7–7.7)
Neutrophils Relative %: 78 %
Platelets: 280 10*3/uL (ref 150–400)
RBC: 4.01 MIL/uL — ABNORMAL LOW (ref 4.22–5.81)
RDW: 16.1 % — AB (ref 11.5–15.5)
WBC: 10.4 10*3/uL (ref 4.0–10.5)

## 2016-05-03 LAB — URINALYSIS, ROUTINE W REFLEX MICROSCOPIC
BACTERIA UA: NONE SEEN
Bilirubin Urine: NEGATIVE
GLUCOSE, UA: 150 mg/dL — AB
Hgb urine dipstick: NEGATIVE
Ketones, ur: NEGATIVE mg/dL
Leukocytes, UA: NEGATIVE
NITRITE: NEGATIVE
PROTEIN: 30 mg/dL — AB
Specific Gravity, Urine: 1.015 (ref 1.005–1.030)
pH: 5 (ref 5.0–8.0)

## 2016-05-03 LAB — COMPREHENSIVE METABOLIC PANEL
ALBUMIN: 3.3 g/dL — AB (ref 3.5–5.0)
ALT: 13 U/L — AB (ref 17–63)
AST: 25 U/L (ref 15–41)
Alkaline Phosphatase: 86 U/L (ref 38–126)
Anion gap: 12 (ref 5–15)
BUN: 11 mg/dL (ref 6–20)
CHLORIDE: 105 mmol/L (ref 101–111)
CO2: 22 mmol/L (ref 22–32)
CREATININE: 1.44 mg/dL — AB (ref 0.61–1.24)
Calcium: 9 mg/dL (ref 8.9–10.3)
GFR calc Af Amer: 55 mL/min — ABNORMAL LOW (ref >60)
GFR calc non Af Amer: 48 mL/min — ABNORMAL LOW (ref >60)
Glucose, Bld: 194 mg/dL — ABNORMAL HIGH (ref 65–99)
Potassium: 4.5 mmol/L (ref 3.5–5.1)
SODIUM: 139 mmol/L (ref 135–145)
Total Bilirubin: 0.3 mg/dL (ref 0.3–1.2)
Total Protein: 6.9 g/dL (ref 6.5–8.1)

## 2016-05-03 LAB — I-STAT TROPONIN, ED: Troponin i, poc: 0 ng/mL (ref 0.00–0.08)

## 2016-05-03 LAB — BRAIN NATRIURETIC PEPTIDE: B Natriuretic Peptide: 36.6 pg/mL (ref 0.0–100.0)

## 2016-05-03 MED ORDER — SODIUM CHLORIDE 0.9 % IV BOLUS (SEPSIS)
500.0000 mL | Freq: Once | INTRAVENOUS | Status: AC
  Administered 2016-05-03: 500 mL via INTRAVENOUS

## 2016-05-03 NOTE — ED Triage Notes (Signed)
Pt arrives via [facility] reporting near syncope, denies fall, dizziness, SOB.  On arrival pt reports chest tightness with back pain, describes as tightness. Reports pain sometimes starts in back. Pt denies recent fever, chills. Pt reports chronic cough.  Pt AOx4, lethargic.

## 2016-05-03 NOTE — ED Provider Notes (Signed)
[facility]-EMERGENCY DEPT Provider Note   CSN: [id number] Arrival date & time: 05/03/16  1533     History   Chief Complaint Chief Complaint  Patient presents with  . Near Syncope  . Chest Pain    HPI [name] Parks is a 70 y.o. male.  HPI patient is a 70 year old male past medical history of COPD, hypertension, hyperlipidemia, type 2 diabetes who presents after a near syncopal episode patient was standing talking to some friends after church when he began to feel lightheaded and weak. He was unable to support himself but was caught by bystanders. He did not completely lose consciousness, fall or hit his head. He is not taking blood thinners. EMS was called and found patient initially hypotensive with systolic blood pressures in the high 80s. POC glucose in 200s pta. En route he complained of some chest tightness which is currently resolved.   Past Medical History:  Diagnosis Date  . CAP (community acquired pneumonia) 10/17/2013  . COPD (chronic obstructive pulmonary disease) (HCC)   . Hyperlipidemia   . Hypertension   . Migraines    "long time ago; none lately" (10/17/2013)  . OSA on CPAP   . PTSD (post-traumatic stress disorder)   . Shortness of breath dyspnea   . Type II diabetes mellitus [facility](HCC)     Patient Active Problem List   Diagnosis Date Noted  . Abnormal nuclear stress test 09/21/2014  . Near syncope   . Atypical chest pain   . Diarrhea 08/29/2014  . Tobacco abuse 08/29/2014  . Elevated troponin 08/29/2014  . Dehydration 08/29/2014  . COPD (chronic obstructive pulmonary disease) (HCC) 08/29/2014  . Syncope 08/29/2014  . Diabetes mellitus without complication (HCC)   . SOB (shortness of breath)   . Dyspnea 05/07/2014  . HCAP (healthcare-associated pneumonia) 10/17/2013  . COPD exacerbation (HCC) 10/17/2013  . Leukocytosis 10/17/2013  . Nicotine abuse 10/17/2013  . Diabetes mellitus (HCC) 10/17/2013  . Hypertension 10/17/2013  . Hyperlipidemia 10/17/2013     Past Surgical History:  Procedure Laterality Date  . EXCISIONAL HEMORRHOIDECTOMY  1970's?  Marland Kitchen. JOINT REPLACEMENT    . LEFT HEART CATHETERIZATION WITH CORONARY ANGIOGRAM N/A 09/21/2014   Procedure: LEFT HEART CATHETERIZATION WITH CORONARY ANGIOGRAM;  Surgeon: Peter M SwazilandJordan, MD;  [facility];  Service: Cardiovascular;  Laterality: N/A;  . TOTAL KNEE ARTHROPLASTY Right ~ 2012  . TOTAL KNEE ARTHROPLASTY Left ~ 2013       Home Medications    Prior to Admission medications   Medication Sig Start Date End Date Taking? Authorizing Provider  albuterol (PROVENTIL HFA;VENTOLIN HFA) 108 (90 BASE) MCG/ACT inhaler Inhale 2 puffs into the lungs every 4 (four) hours as needed for wheezing or shortness of breath. 05/08/14  Yes Ardith Darkaleb M Parker, MD  amLODipine (NORVASC) 5 MG tablet Take 1 tablet (5 mg total) by mouth daily. 08/31/14  Yes Kathlen ModyVijaya Akula, MD  aspirin EC 81 MG tablet Take 81 mg by mouth daily.   Yes Historical Provider, MD  atorvastatin (LIPITOR) 80 MG tablet Take 80 mg by mouth at bedtime.   Yes Historical Provider, MD  cholecalciferol (VITAMIN D) 1000 UNITS tablet Take 1,000 Units by mouth daily.   Yes Historical Provider, MD  flunisolide (NASALIDE) 25 MCG/ACT (0.025%) SOLN Place 2 sprays into the nose 2 (two) times daily as needed (for sinuses).   Yes Historical Provider, MD  gabapentin (NEURONTIN) 300 MG capsule Take 300-900 mg by mouth 3 (three) times daily. Take 300 mg every morning and at  2 pm and take 900 mg at bedtime   Yes Historical Provider, MD  HYDROcodone-acetaminophen (NORCO) 10-325 MG per tablet Take 1 tablet by mouth 4 (four) times daily as needed (for pain).   Yes Historical Provider, MD  insulin aspart (NOVOLOG) 100 UNIT/ML injection Inject 6 Units into the skin 3 (three) times daily with meals. Patient taking differently: Inject 4 Units into the skin 3 (three) times daily with meals. Pt uses sliding scale 10/20/13  Yes Tora KindredMarianne L York, PA-C  insulin glargine (LANTUS)  100 UNIT/ML injection Inject 0.25 mLs (25 Units total) into the skin at bedtime. Patient taking differently: Inject 28 Units into the skin at bedtime.  10/20/13  Yes Tora KindredMarianne L York, PA-C  metFORMIN (GLUCOPHAGE) 500 MG tablet Take by mouth 2 (two) times daily with a meal.   Yes Historical Provider, MD  metoprolol tartrate (LOPRESSOR) 25 MG tablet Take 37.5 mg by mouth 2 (two) times daily.   Yes Historical Provider, MD  mirtazapine (REMERON) 15 MG tablet Take 7.5 mg by mouth at bedtime.   Yes Historical Provider, MD  Multiple Vitamins-Minerals (MULTIVITAMIN WITH MINERALS) tablet Take 1 tablet by mouth daily.   Yes Historical Provider, MD  omeprazole (PRILOSEC) 20 MG capsule Take 20 mg by mouth 2 (two) times daily.   Yes Historical Provider, MD  PARoxetine (PAXIL) 40 MG tablet Take 40 mg by mouth daily.   Yes Historical Provider, MD  potassium chloride SA (K-DUR,KLOR-CON) 20 MEQ tablet Take 20 mEq by mouth daily.   Yes Historical Provider, MD  senna-docusate (SENOKOT-S) 8.6-50 MG per tablet Take 2 tablets by mouth at bedtime.   Yes Historical Provider, MD  temazepam (RESTORIL) 30 MG capsule Take 30 mg by mouth at bedtime as needed for sleep.   Yes Historical Provider, MD  terazosin (HYTRIN) 10 MG capsule Take 10 mg by mouth at bedtime.   Yes Historical Provider, MD    Family History Family History  Problem Relation Age of Onset  . Diabetes Mother   . Heart attack Father   . Diabetes Sister     Social History Social History  Substance Use Topics  . Smoking status: Current Every Day Smoker    Packs/day: 1.00    Years: 50.00    Types: Cigarettes  . Smokeless tobacco: Never Used  . Alcohol use No     Comment: "used to drink a long time ago; none since the 1970's"     Allergies   Paxil [paroxetine hcl]; Trazodone and nefazodone; and Wellbutrin [bupropion]   Review of Systems Review of Systems  Constitutional: Negative for chills and fever.  HENT: Negative for ear pain, nosebleeds  and sore throat.   Eyes: Negative for visual disturbance.  Respiratory: Positive for cough (chronic and unchanged) and chest tightness. Negative for shortness of breath.   Cardiovascular: Positive for chest pain. Negative for palpitations and leg swelling.  Gastrointestinal: Negative for abdominal pain, blood in stool, diarrhea and vomiting.  Genitourinary: Negative for dysuria and hematuria. Scrotal swelling: currently resolved.  Musculoskeletal: Positive for back pain (intermittent upper back pain that is chronic). Negative for arthralgias.  Skin: Negative for color change and rash.  Neurological: Positive for syncope, weakness (near syncope) and light-headedness. Negative for seizures and headaches.  All other systems reviewed and are negative.    Physical Exam Updated Vital Signs BP 160/87   Pulse 80   Temp 98.6 F (37 C) (Oral)   Resp 15   Ht 5\' 11"  (1.803 m)   Wt  88.5 kg   SpO2 97%   BMI 27.20 kg/m   Physical Exam  Constitutional: He is oriented to person, place, and time. He appears well-developed and well-nourished.  HENT:  Head: Normocephalic and atraumatic.  Eyes: Conjunctivae and EOM are normal. Pupils are equal, round, and reactive to light.  Neck: Neck supple.  Cardiovascular: Normal rate and regular rhythm.   No murmur heard. Pulmonary/Chest: Effort normal and breath sounds normal. No respiratory distress. He has no wheezes.  Abdominal: Soft. There is no tenderness.  Musculoskeletal: He exhibits no edema.  Neurological: He is alert and oriented to person, place, and time. He displays normal reflexes. No cranial nerve deficit or sensory deficit. He exhibits normal muscle tone. Coordination normal.  Skin: Skin is warm and dry. Capillary refill takes less than 2 seconds.  Psychiatric: He has a normal mood and affect.  Nursing note and vitals reviewed.    ED Treatments / Results  Labs (all labs ordered are listed, but only abnormal results are displayed) Labs  Reviewed  CBC WITH DIFFERENTIAL/PLATELET - Abnormal; Notable for the following:       Result Value   RBC 4.01 (*)    Hemoglobin 9.6 (*)    HCT 30.7 (*)    MCV 76.6 (*)    MCH 23.9 (*)    RDW 16.1 (*)    Neutro Abs 8.1 (*)    All other components within normal limits  URINALYSIS, ROUTINE W REFLEX MICROSCOPIC - Abnormal; Notable for the following:    Glucose, UA 150 (*)    Protein, ur 30 (*)    Squamous Epithelial / LPF 0-5 (*)    All other components within normal limits  COMPREHENSIVE METABOLIC PANEL - Abnormal; Notable for the following:    Glucose, Bld 194 (*)    Creatinine, Ser 1.44 (*)    Albumin 3.3 (*)    ALT 13 (*)    GFR calc non Af Amer 48 (*)    GFR calc Af Amer 55 (*)    All other components within normal limits  I-STAT VENOUS BLOOD GAS, ED - Abnormal; Notable for the following:    pCO2, Ven 38.5 (*)    All other components within normal limits  BRAIN NATRIURETIC PEPTIDE  BLOOD GAS, VENOUS  I-STAT TROPOININ, ED  CBG MONITORING, ED  CBG MONITORING, ED    EKG  EKG Interpretation None       Radiology Dg Chest 2 View  Result Date: 05/03/2016 CLINICAL DATA:  Near syncope.  Chest tightness. EXAM: CHEST  2 VIEW COMPARISON:  Of 08/28/2014 chest radiograph. FINDINGS: Stable cardiomediastinal silhouette with normal heart size and aortic atherosclerosis. No pneumothorax. No pleural effusion. No pulmonary edema. Emphysema. No acute consolidative airspace disease. IMPRESSION: 1. No acute cardiopulmonary disease. 2. Emphysema. 3. Aortic atherosclerosis . Electronically Signed   By: Delbert Phenix M.D.   On: 05/03/2016 16:36    Procedures Procedures (including critical care time)  Medications Ordered in ED Medications  sodium chloride 0.9 % bolus 500 mL (0 mLs Intravenous Stopped 05/03/16 1922)     Initial Impression / Assessment and Plan / ED Course  I have reviewed the triage vital signs and the nursing notes.  Pertinent labs & imaging results that were  available during my care of the patient were reviewed by me and considered in my medical decision making (see chart for details).  Clinical Course    Patient is a 70 year old male with possible history as above who presents after a  near syncopal episode at church.  Of note patient has a history of similar symptoms in the past. He had a workup in April 2016 that included left heart catheter showing minor nonobstructive CAD and normal LV function. At that time it was felt that syncope was related to hypotension and amlodipine was stopped.  On further discussion with family patient has had multiple syncopal episodes since then including once last week. Workup at the [facility] has so far been unrevealing.  Today patient is afebrile, normotensive without tachycardia on arrival. No focal neurologic deficits on exam. Patient mildly orthostatic blood pressures, but denies dizziness with standing. IV fluids given as patient appears clinically dehydrated.   EKG shows LVH with likely early repolarization, prolonged QTc, no acute changes c/w prior. CXR unremarkable. Labs significant for anemia (appears similar to past values, though most recent available labs are from a year ago), mildly elevated BUN and Creatinine likely secondary to dehydration. Troponin and BNP unremarkable.   Reevaluation patient reports he is feeling significantly improved. He is ambulating without difficulty. He is tolerating by mouth. Given negative workup a year ago, unremarkable EKG and cardiac enzymes today doubt cardiac cause for syncope. Neurologic exam is normal. Patient mildly orthostatic and appears clinically dry. Episode today likely related to dehydration and prolonged standing at church.  Discharged in stable condition. Lab findings discussed with patient and family. Advised to follow-up with his primary care doctor for reevaluation and repeat labs to ensure his kidney function and hemoglobin is trended towards normal. Advised to  seek follow-up with cardiologist for reevaluation of syncope. Patient may require a Holter monitor or other long-term monitoring to rule out arrhythmia as a cause of syncope. Return precautions were discussed with patient and family and they report understanding and agreement with plan.  Patient care is supervised by Dr. Fayrene Fearing, ED attending   Final Clinical Impressions(s) / ED Diagnoses   Final diagnoses:  Near syncope    New Prescriptions Discharge Medication List as of 05/03/2016  8:57 PM       Isa Rankin, MD 05/04/16 0200    Rolland Porter, MD 05/10/16 [phone]

## 2016-05-03 NOTE — ED Provider Notes (Signed)
Pt seen and evaluated.  This with Dr. Katrinka BlazingSmith. Patient has history of dizziness that is frequent. 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 Today was standing outside a church and felt dizzy. This resolved. States he gets tightness in his chest "all the time". He had a normal calf approximately 1 year ago with normal coronaries. He has LVH but has normal function with EF 55-60. No cardiomyopathy. No history of arrhythmias.  EKG today shows no changes. Shows LVH and sinus based rhythm.  Pt with anemia, slightly worse today at 9.7.  Denies bleeding.  If remainder of labs OK, may be candidate for DC and PCP /cardiology f/u.   Rolland PorterMark Analiya Porco, MD 05/03/16 1700

## 2016-05-03 NOTE — Discharge Instructions (Signed)
You should follow up with your doctor and your cardiologist regarding recurrent near syncopal episodes. You may want to discuss referral for a cardiac event monitor. In addition, your labs today showed elevated kidney function, likely due to dehydration. Drink plenty of fluids for the next few days. When you follow up with your doctor you should have a re-check of your kidney function to ensure it is trending toward normal.

## 2016-05-03 NOTE — ED Notes (Signed)
Patient transported to X-ray 

## 2016-05-03 NOTE — ED Notes (Signed)
Pt states unable to urinate at this time.  Given urinal.

## 2016-05-03 NOTE — ED Notes (Signed)
Ambulated in hall.  Tolerated well.  No distress noted or complaints voiced.  Family remains at the bedside.  Encouraged to call for assistance as needed.

## 2017-06-12 ENCOUNTER — Other Ambulatory Visit: Payer: Self-pay

## 2017-06-12 ENCOUNTER — Emergency Department (HOSPITAL_COMMUNITY): Payer: Medicare Other

## 2017-06-12 ENCOUNTER — Inpatient Hospital Stay (HOSPITAL_COMMUNITY)
Admission: EM | Admit: 2017-06-12 | Discharge: 2017-06-14 | DRG: 690 | Disposition: A | Discharge status: Home / Self Care | Payer: Medicare Other | Attending: Internal Medicine | Admitting: Internal Medicine

## 2017-06-12 ENCOUNTER — Encounter (HOSPITAL_COMMUNITY): Payer: Self-pay | Admitting: *Deleted

## 2017-06-12 DIAGNOSIS — Z79899 Other long term (current) drug therapy: Secondary | ICD-10-CM

## 2017-06-12 DIAGNOSIS — R0602 Shortness of breath: Secondary | ICD-10-CM | POA: Diagnosis present

## 2017-06-12 DIAGNOSIS — R319 Hematuria, unspecified: Secondary | ICD-10-CM | POA: Diagnosis present

## 2017-06-12 DIAGNOSIS — Z96653 Presence of artificial knee joint, bilateral: Secondary | ICD-10-CM | POA: Diagnosis present

## 2017-06-12 DIAGNOSIS — Z833 Family history of diabetes mellitus: Secondary | ICD-10-CM

## 2017-06-12 DIAGNOSIS — E119 Type 2 diabetes mellitus without complications: Secondary | ICD-10-CM | POA: Diagnosis present

## 2017-06-12 DIAGNOSIS — Z888 Allergy status to other drugs, medicaments and biological substances status: Secondary | ICD-10-CM

## 2017-06-12 DIAGNOSIS — R05 Cough: Secondary | ICD-10-CM

## 2017-06-12 DIAGNOSIS — Z7982 Long term (current) use of aspirin: Secondary | ICD-10-CM

## 2017-06-12 DIAGNOSIS — B964 Proteus (mirabilis) (morganii) as the cause of diseases classified elsewhere: Secondary | ICD-10-CM | POA: Diagnosis present

## 2017-06-12 DIAGNOSIS — Y92009 Unspecified place in unspecified non-institutional (private) residence as the place of occurrence of the external cause: Secondary | ICD-10-CM

## 2017-06-12 DIAGNOSIS — N179 Acute kidney failure, unspecified: Secondary | ICD-10-CM | POA: Diagnosis present

## 2017-06-12 DIAGNOSIS — N390 Urinary tract infection, site not specified: Secondary | ICD-10-CM | POA: Diagnosis not present

## 2017-06-12 DIAGNOSIS — E7800 Pure hypercholesterolemia, unspecified: Secondary | ICD-10-CM

## 2017-06-12 DIAGNOSIS — E785 Hyperlipidemia, unspecified: Secondary | ICD-10-CM | POA: Diagnosis present

## 2017-06-12 DIAGNOSIS — I10 Essential (primary) hypertension: Secondary | ICD-10-CM | POA: Diagnosis present

## 2017-06-12 DIAGNOSIS — M25512 Pain in left shoulder: Secondary | ICD-10-CM | POA: Diagnosis present

## 2017-06-12 DIAGNOSIS — Z794 Long term (current) use of insulin: Secondary | ICD-10-CM

## 2017-06-12 DIAGNOSIS — W1839XA Other fall on same level, initial encounter: Secondary | ICD-10-CM | POA: Diagnosis present

## 2017-06-12 DIAGNOSIS — E860 Dehydration: Secondary | ICD-10-CM | POA: Diagnosis present

## 2017-06-12 DIAGNOSIS — G4733 Obstructive sleep apnea (adult) (pediatric): Secondary | ICD-10-CM | POA: Diagnosis present

## 2017-06-12 DIAGNOSIS — R059 Cough, unspecified: Secondary | ICD-10-CM

## 2017-06-12 DIAGNOSIS — J449 Chronic obstructive pulmonary disease, unspecified: Secondary | ICD-10-CM | POA: Diagnosis not present

## 2017-06-12 DIAGNOSIS — F17210 Nicotine dependence, cigarettes, uncomplicated: Secondary | ICD-10-CM | POA: Diagnosis present

## 2017-06-12 DIAGNOSIS — G43909 Migraine, unspecified, not intractable, without status migrainosus: Secondary | ICD-10-CM | POA: Diagnosis present

## 2017-06-12 DIAGNOSIS — F4310 Post-traumatic stress disorder, unspecified: Secondary | ICD-10-CM | POA: Diagnosis present

## 2017-06-12 LAB — URINALYSIS, ROUTINE W REFLEX MICROSCOPIC
Bilirubin Urine: NEGATIVE
Glucose, UA: NEGATIVE mg/dL
Ketones, ur: NEGATIVE mg/dL
Nitrite: POSITIVE — AB
PROTEIN: 100 mg/dL — AB
SQUAMOUS EPITHELIAL / LPF: NONE SEEN
Specific Gravity, Urine: 1.018 (ref 1.005–1.030)
pH: 6 (ref 5.0–8.0)

## 2017-06-12 LAB — GLUCOSE, CAPILLARY: GLUCOSE-CAPILLARY: 117 mg/dL — AB (ref 65–99)

## 2017-06-12 LAB — CBC
HCT: 39.1 % (ref 39.0–52.0)
HCT: 37 % — ABNORMAL LOW (ref 39.0–52.0)
HEMOGLOBIN: 12.8 g/dL — AB (ref 13.0–17.0)
Hemoglobin: 13.5 g/dL (ref 13.0–17.0)
MCH: 28.4 pg (ref 26.0–34.0)
MCH: 28.3 pg (ref 26.0–34.0)
MCHC: 34.5 g/dL (ref 30.0–36.0)
MCHC: 34.6 g/dL (ref 30.0–36.0)
MCV: 82.3 fL (ref 78.0–100.0)
MCV: 81.9 fL (ref 78.0–100.0)
PLATELETS: 229 10*3/uL (ref 150–400)
PLATELETS: 209 10*3/uL (ref 150–400)
RBC: 4.75 MIL/uL (ref 4.22–5.81)
RBC: 4.52 MIL/uL (ref 4.22–5.81)
RDW: 20.7 % — ABNORMAL HIGH (ref 11.5–15.5)
RDW: 20.1 % — ABNORMAL HIGH (ref 11.5–15.5)
WBC: 21.7 10*3/uL — ABNORMAL HIGH (ref 4.0–10.5)
WBC: 15.7 10*3/uL — ABNORMAL HIGH (ref 4.0–10.5)

## 2017-06-12 LAB — CREATININE, SERUM
CREATININE: 1.31 mg/dL — AB (ref 0.61–1.24)
GFR calc Af Amer: >60 mL/min (ref >60)
GFR, EST NON AFRICAN AMERICAN: 53 mL/min — AB (ref >60)

## 2017-06-12 LAB — COMPREHENSIVE METABOLIC PANEL
ALT: 14 U/L — AB (ref 17–63)
AST: 21 U/L (ref 15–41)
Albumin: 3.5 g/dL (ref 3.5–5.0)
Alkaline Phosphatase: 107 U/L (ref 38–126)
Anion gap: 12 (ref 5–15)
BUN: 13 mg/dL (ref 6–20)
CHLORIDE: 103 mmol/L (ref 101–111)
CO2: 23 mmol/L (ref 22–32)
CREATININE: 1.55 mg/dL — AB (ref 0.61–1.24)
Calcium: 9.5 mg/dL (ref 8.9–10.3)
GFR calc non Af Amer: 43 mL/min — ABNORMAL LOW (ref >60)
GFR, EST AFRICAN AMERICAN: 50 mL/min — AB (ref >60)
Glucose, Bld: 145 mg/dL — ABNORMAL HIGH (ref 65–99)
Potassium: 3.9 mmol/L (ref 3.5–5.1)
SODIUM: 138 mmol/L (ref 135–145)
Total Bilirubin: 0.7 mg/dL (ref 0.3–1.2)
Total Protein: 7.3 g/dL (ref 6.5–8.1)

## 2017-06-12 LAB — I-STAT TROPONIN, ED: TROPONIN I, POC: 0.03 ng/mL (ref 0.00–0.08)

## 2017-06-12 LAB — I-STAT CG4 LACTIC ACID, ED
LACTIC ACID, VENOUS: 1.3 mmol/L (ref 0.5–1.9)
LACTIC ACID, VENOUS: 1.01 mmol/L (ref 0.5–1.9)

## 2017-06-12 MED ORDER — ACETAMINOPHEN 325 MG PO TABS: 650.0000 mg | ORAL_TABLET | Freq: Four times a day (QID) | ORAL | Status: DC | PRN

## 2017-06-12 MED ORDER — GABAPENTIN 300 MG PO CAPS
300.0000 mg | ORAL_CAPSULE | ORAL | Status: DC
Start: 2017-06-13 — End: 2017-06-14
  Administered 2017-06-13 – 2017-06-14 (×4): 300 mg via ORAL
  Filled 2017-06-12 (×2): qty 1

## 2017-06-12 MED ORDER — SODIUM CHLORIDE 0.9 % IV BOLUS (SEPSIS)
500.0000 mL | Freq: Once | INTRAVENOUS | Status: AC
  Administered 2017-06-12: 500 mL via INTRAVENOUS

## 2017-06-12 MED ORDER — AMLODIPINE BESYLATE 5 MG PO TABS
5.0000 mg | ORAL_TABLET | Freq: Every day | ORAL | Status: DC
  Administered 2017-06-13 – 2017-06-14 (×2): 5 mg via ORAL
  Filled 2017-06-12 (×2): qty 1

## 2017-06-12 MED ORDER — TERAZOSIN HCL 5 MG PO CAPS
10.0000 mg | ORAL_CAPSULE | Freq: Every day | ORAL | Status: DC
  Administered 2017-06-12 – 2017-06-13 (×2): 10 mg via ORAL
  Filled 2017-06-12 (×4): qty 2

## 2017-06-12 MED ORDER — ONDANSETRON HCL 4 MG PO TABS: 4.0000 mg | ORAL_TABLET | Freq: Four times a day (QID) | ORAL | Status: DC | PRN

## 2017-06-12 MED ORDER — IPRATROPIUM-ALBUTEROL 0.5-2.5 (3) MG/3ML IN SOLN
3.0000 mL | Freq: Once | RESPIRATORY_TRACT | Status: AC
  Administered 2017-06-12: 3 mL via RESPIRATORY_TRACT
  Filled 2017-06-12: qty 3

## 2017-06-12 MED ORDER — IPRATROPIUM-ALBUTEROL 0.5-2.5 (3) MG/3ML IN SOLN
3.0000 mL | Freq: Four times a day (QID) | RESPIRATORY_TRACT | Status: DC
  Administered 2017-06-12: 3 mL via RESPIRATORY_TRACT
  Filled 2017-06-12: qty 3

## 2017-06-12 MED ORDER — PAROXETINE HCL 20 MG PO TABS
40.0000 mg | ORAL_TABLET | Freq: Every day | ORAL | Status: DC
  Administered 2017-06-13 – 2017-06-14 (×2): 40 mg via ORAL
  Filled 2017-06-12 (×2): qty 2

## 2017-06-12 MED ORDER — PANTOPRAZOLE SODIUM 40 MG PO TBEC
40.0000 mg | DELAYED_RELEASE_TABLET | Freq: Every day | ORAL | Status: DC
  Administered 2017-06-13 – 2017-06-14 (×2): 40 mg via ORAL
  Filled 2017-06-12 (×2): qty 1

## 2017-06-12 MED ORDER — DEXTROSE 5 % IV SOLN
1.0000 g | Freq: Once | INTRAVENOUS | Status: AC
  Administered 2017-06-12: 1 g via INTRAVENOUS
  Filled 2017-06-12: qty 10

## 2017-06-12 MED ORDER — METOPROLOL TARTRATE 25 MG PO TABS
37.5000 mg | ORAL_TABLET | Freq: Two times a day (BID) | ORAL | Status: DC
  Administered 2017-06-12 – 2017-06-14 (×4): 37.5 mg via ORAL
  Filled 2017-06-12 (×4): qty 1

## 2017-06-12 MED ORDER — HYDROCODONE-ACETAMINOPHEN 10-325 MG PO TABS
1.0000 | ORAL_TABLET | Freq: Four times a day (QID) | ORAL | Status: DC | PRN
  Administered 2017-06-12 – 2017-06-14 (×3): 1 via ORAL
  Filled 2017-06-12 (×4): qty 1

## 2017-06-12 MED ORDER — IPRATROPIUM-ALBUTEROL 0.5-2.5 (3) MG/3ML IN SOLN
3.0000 mL | Freq: Three times a day (TID) | RESPIRATORY_TRACT | Status: DC
  Administered 2017-06-13: 3 mL via RESPIRATORY_TRACT
  Filled 2017-06-12: qty 3

## 2017-06-12 MED ORDER — ACETAMINOPHEN 650 MG RE SUPP: 650.0000 mg | Freq: Four times a day (QID) | RECTAL | Status: DC | PRN

## 2017-06-12 MED ORDER — GABAPENTIN 300 MG PO CAPS
900.0000 mg | ORAL_CAPSULE | Freq: Every day | ORAL | Status: DC
  Administered 2017-06-12 – 2017-06-13 (×2): 900 mg via ORAL
  Filled 2017-06-12 (×4): qty 3

## 2017-06-12 MED ORDER — ADULT MULTIVITAMIN W/MINERALS CH
1.0000 | ORAL_TABLET | Freq: Every day | ORAL | Status: DC
  Administered 2017-06-13 – 2017-06-14 (×2): 1 via ORAL
  Filled 2017-06-12 (×2): qty 1

## 2017-06-12 MED ORDER — SODIUM CHLORIDE 0.9 % IV SOLN
Freq: Once | INTRAVENOUS | Status: AC
  Administered 2017-06-12: 18:00:00 via INTRAVENOUS

## 2017-06-12 MED ORDER — INSULIN ASPART 100 UNIT/ML ~~LOC~~ SOLN
4.0000 [IU] | Freq: Three times a day (TID) | SUBCUTANEOUS | Status: DC
  Administered 2017-06-13 – 2017-06-14 (×3): 4 [IU] via SUBCUTANEOUS

## 2017-06-12 MED ORDER — ASPIRIN EC 81 MG PO TBEC
81.0000 mg | DELAYED_RELEASE_TABLET | Freq: Every day | ORAL | Status: DC
Start: 2017-06-13 — End: 2017-06-14
  Administered 2017-06-13 – 2017-06-14 (×2): 81 mg via ORAL
  Filled 2017-06-12 (×2): qty 1

## 2017-06-12 MED ORDER — SODIUM CHLORIDE 0.9 % IV SOLN
INTRAVENOUS | Status: AC
  Administered 2017-06-12 – 2017-06-13 (×2): via INTRAVENOUS

## 2017-06-12 MED ORDER — SENNOSIDES-DOCUSATE SODIUM 8.6-50 MG PO TABS: 1.0000 | ORAL_TABLET | Freq: Every evening | ORAL | Status: DC | PRN

## 2017-06-12 MED ORDER — VITAMIN D 1000 UNITS PO TABS
1000.0000 [IU] | ORAL_TABLET | Freq: Every day | ORAL | Status: DC
  Administered 2017-06-13 – 2017-06-14 (×2): 1000 [IU] via ORAL
  Filled 2017-06-12 (×2): qty 1

## 2017-06-12 MED ORDER — DEXTROSE 5 % IV SOLN
1.0000 g | INTRAVENOUS | Status: DC
  Administered 2017-06-13: 1 g via INTRAVENOUS
  Filled 2017-06-12 (×2): qty 10

## 2017-06-12 MED ORDER — SODIUM CHLORIDE 0.9% FLUSH
3.0000 mL | Freq: Two times a day (BID) | INTRAVENOUS | Status: DC
  Administered 2017-06-13 – 2017-06-14 (×3): 3 mL via INTRAVENOUS

## 2017-06-12 MED ORDER — TEMAZEPAM 15 MG PO CAPS
30.0000 mg | ORAL_CAPSULE | Freq: Every evening | ORAL | Status: DC | PRN
  Filled 2017-06-12: qty 2

## 2017-06-12 MED ORDER — ENOXAPARIN SODIUM 40 MG/0.4ML ~~LOC~~ SOLN
40.0000 mg | SUBCUTANEOUS | Status: DC
  Administered 2017-06-13 – 2017-06-14 (×2): 40 mg via SUBCUTANEOUS
  Filled 2017-06-12 (×2): qty 0.4

## 2017-06-12 MED ORDER — METFORMIN HCL 500 MG PO TABS: 500.0000 mg | ORAL_TABLET | Freq: Two times a day (BID) | ORAL | Status: DC

## 2017-06-12 MED ORDER — INSULIN GLARGINE 100 UNIT/ML ~~LOC~~ SOLN
14.0000 [IU] | Freq: Every day | SUBCUTANEOUS | Status: DC
  Administered 2017-06-12 – 2017-06-13 (×2): 14 [IU] via SUBCUTANEOUS
  Filled 2017-06-12 (×3): qty 0.14

## 2017-06-12 MED ORDER — FLUTICASONE PROPIONATE 50 MCG/ACT NA SUSP
2.0000 | Freq: Every day | NASAL | Status: DC
  Administered 2017-06-13 – 2017-06-14 (×2): 2 via NASAL
  Filled 2017-06-12: qty 16

## 2017-06-12 MED ORDER — MIRTAZAPINE 7.5 MG PO TABS
7.5000 mg | ORAL_TABLET | Freq: Every day | ORAL | Status: DC
  Administered 2017-06-12 – 2017-06-13 (×2): 7.5 mg via ORAL
  Filled 2017-06-12 (×3): qty 1

## 2017-06-12 MED ORDER — ATORVASTATIN CALCIUM 80 MG PO TABS
80.0000 mg | ORAL_TABLET | Freq: Every day | ORAL | Status: DC
  Administered 2017-06-12 – 2017-06-13 (×2): 80 mg via ORAL
  Filled 2017-06-12 (×2): qty 1

## 2017-06-12 MED ORDER — ONDANSETRON HCL 4 MG/2ML IJ SOLN: 4.0000 mg | Freq: Four times a day (QID) | INTRAMUSCULAR | Status: DC | PRN

## 2017-06-12 NOTE — Progress Notes (Signed)
Ceftriaxone for UTI per pharmacy ordered.  Ceftriaxone does not need renal adjustment. P&T policy allows pharmacy to change the ordered dose based on indication without contacting the provider.   Plan: -ceftriaxone 1g IV q24h -pharmacy to sign off as no adjustment needed.  August Gosser D. Ladasia Sircy, PharmD, BCPS Clinical Pharmacist  [phone] 06/12/2017 8:11 PM

## 2017-06-12 NOTE — ED Provider Notes (Signed)
[facility]   CSN: [id number] Arrival date & time: 06/12/17  1213     History   Chief Complaint Chief Complaint  Patient presents with  . Fever  . Cough  . Hematuria    HPI Robb MatarLarry Parks is a 72 y.o. male.  HPI Robb MatarLarry Lecount is a 72 y.o. male with hx of CAD, COPD, HTN, DM, presents to ED with complaint of cough, fever, chills, urinary symptoms, weakness, fall. Pt states  He has been feeling bad for the last 2 weeks, but worsening in the last few days. States fever up to 100.9 yesterday. States productive cough. Reports also has had urinary frequency, disuria, urgency. Pt states he has not slept last night bc had to go to the bathroom every 30 min. Pt states "I pinched my penis shut so I dont pee on myself, and now its bleeding." pt reports generalized abdominal pain and chest pain. Pt also fell this morning after getting "off balance" while running to the bathroom. statse hit his head. No LOC. Reports mild left shoulder pain after fall. States had some dizziness prior to the fall.   Past Medical History:  Diagnosis Date  . CAP (community acquired pneumonia) 10/17/2013  . COPD (chronic obstructive pulmonary disease) (HCC)   . Hyperlipidemia   . Hypertension   . Migraines    "long time ago; none lately" (10/17/2013)  . OSA on CPAP   . PTSD (post-traumatic stress disorder)   . Shortness of breath dyspnea   . Type II diabetes mellitus [facility](HCC)     Patient Active Problem List   Diagnosis Date Noted  . Abnormal nuclear stress test 09/21/2014  . Near syncope   . Atypical chest pain   . Diarrhea 08/29/2014  . Tobacco abuse 08/29/2014  . Elevated troponin 08/29/2014  . Dehydration 08/29/2014  . COPD (chronic obstructive pulmonary disease) (HCC) 08/29/2014  . Syncope 08/29/2014  . Diabetes mellitus without complication (HCC)   . SOB (shortness of breath)   . Dyspnea 05/07/2014  . HCAP (healthcare-associated pneumonia)  10/17/2013  . COPD exacerbation (HCC) 10/17/2013  . Leukocytosis 10/17/2013  . Nicotine abuse 10/17/2013  . Diabetes mellitus (HCC) 10/17/2013  . Hypertension 10/17/2013  . Hyperlipidemia 10/17/2013    Past Surgical History:  Procedure Laterality Date  . EXCISIONAL HEMORRHOIDECTOMY  1970's?  Marland Kitchen. JOINT REPLACEMENT    . LEFT HEART CATHETERIZATION WITH CORONARY ANGIOGRAM N/A 09/21/2014   Procedure: LEFT HEART CATHETERIZATION WITH CORONARY ANGIOGRAM;  Surgeon: Peter M SwazilandJordan, MD;  [facility];  Service: Cardiovascular;  Laterality: N/A;  . TOTAL KNEE ARTHROPLASTY Right ~ 2012  . TOTAL KNEE ARTHROPLASTY Left ~ 2013       Home Medications    Prior to Admission medications   Medication Sig Start Date End Date Taking? Authorizing Provider  albuterol (PROVENTIL HFA;VENTOLIN HFA) 108 (90 BASE) MCG/ACT inhaler Inhale 2 puffs into the lungs every 4 (four) hours as needed for wheezing or shortness of breath. 05/08/14   Ardith DarkParker, Caleb M, MD  amLODipine (NORVASC) 5 MG tablet Take 1 tablet (5 mg total) by mouth daily. 08/31/14   Kathlen ModyAkula, Vijaya, MD  aspirin EC 81 MG tablet Take 81 mg by mouth daily.    [provider]  atorvastatin (LIPITOR) 80 MG tablet Take 80 mg by mouth at bedtime.    [provider]  cholecalciferol (VITAMIN D) 1000 UNITS tablet Take 1,000 Units by mouth daily.    [provider]  flunisolide (  NASALIDE) 25 MCG/ACT (0.025%) SOLN Place 2 sprays into the nose 2 (two) times daily as needed (for sinuses).    [provider]  gabapentin (NEURONTIN) 300 MG capsule Take 300-900 mg by mouth 3 (three) times daily. Take 300 mg every morning and at 2 pm and take 900 mg at bedtime    [provider]  HYDROcodone-acetaminophen (NORCO) 10-325 MG per tablet Take 1 tablet by mouth 4 (four) times daily as needed (for pain).    [provider]  insulin aspart (NOVOLOG) 100 UNIT/ML injection Inject 6 Units into the skin 3 (three) times daily  with meals. Patient taking differently: Inject 4 Units into the skin 3 (three) times daily with meals. Pt uses sliding scale 10/20/13   Dellinger, Clerance Lav L, PA-C  insulin glargine (LANTUS) 100 UNIT/ML injection Inject 0.25 mLs (25 Units total) into the skin at bedtime. Patient taking differently: Inject 28 Units into the skin at bedtime.  10/20/13   Dellinger, Tora Kindred, PA-C  metFORMIN (GLUCOPHAGE) 500 MG tablet Take by mouth 2 (two) times daily with a meal.    [provider]  metoprolol tartrate (LOPRESSOR) 25 MG tablet Take 37.5 mg by mouth 2 (two) times daily.    [provider]  mirtazapine (REMERON) 15 MG tablet Take 7.5 mg by mouth at bedtime.    [provider]  Multiple Vitamins-Minerals (MULTIVITAMIN WITH MINERALS) tablet Take 1 tablet by mouth daily.    [provider]  omeprazole (PRILOSEC) 20 MG capsule Take 20 mg by mouth 2 (two) times daily.    [provider]  PARoxetine (PAXIL) 40 MG tablet Take 40 mg by mouth daily.    [provider]  potassium chloride SA (K-DUR,KLOR-CON) 20 MEQ tablet Take 20 mEq by mouth daily.    [provider]  senna-docusate (SENOKOT-S) 8.6-50 MG per tablet Take 2 tablets by mouth at bedtime.    [provider]  temazepam (RESTORIL) 30 MG capsule Take 30 mg by mouth at bedtime as needed for sleep.    [provider]  terazosin (HYTRIN) 10 MG capsule Take 10 mg by mouth at bedtime.    [provider]    Family History Family History  Problem Relation Age of Onset  . Diabetes Mother   . Heart attack Father   . Diabetes Sister     Social History Social History   Tobacco Use  . Smoking status: Current Every Day Smoker    Packs/day: 1.00    Years: 50.00    Pack years: 50.00    Types: Cigarettes  . Smokeless tobacco: Never Used  Substance Use Topics  . Alcohol use: No    Comment: "used to drink a long time ago; none since the 1970's"  . Drug use: No      Allergies   Paxil [paroxetine hcl]; Trazodone and nefazodone; and Wellbutrin [bupropion]   Review of Systems Review of Systems  Constitutional: Positive for chills and fever.  HENT: Positive for congestion.   Respiratory: Positive for cough. Negative for chest tightness and shortness of breath.   Cardiovascular: Negative for chest pain, palpitations and leg swelling.  Gastrointestinal: Positive for nausea. Negative for abdominal distention, abdominal pain, diarrhea and vomiting.  Genitourinary: Positive for dysuria, frequency and urgency. Negative for difficulty urinating, flank pain and hematuria.  Musculoskeletal: Negative for arthralgias, myalgias, neck pain and neck stiffness.  Skin: Negative for rash.  Allergic/Immunologic: Negative for immunocompromised state.  Neurological: Positive for dizziness and light-headedness. Negative for  weakness, numbness and headaches.  All other systems reviewed and are negative.    Physical Exam Updated Vital Signs BP 129/73   Pulse 94   Temp 98.9 F (37.2 C) (Oral)   Resp 18   SpO2 97%   Physical Exam  Constitutional: He is oriented to person, place, and time. He appears well-developed and well-nourished. No distress.  HENT:  Head: Normocephalic and atraumatic.  Eyes: Conjunctivae are normal.  Neck: Normal range of motion. Neck supple.  No meningimus  Cardiovascular: Normal rate, regular rhythm and normal heart sounds.  Pulmonary/Chest: Effort normal. No respiratory distress. He has no wheezes. He has no rales. He exhibits no tenderness.  Coughing, decreased air movement bilaterally  Abdominal: Soft. Bowel sounds are normal. He exhibits no distension. There is tenderness. There is no rebound.  Suprapubic tenderness. No CVA tenderness  Musculoskeletal: He exhibits no edema.  Neurological: He is alert and oriented to person, place, and time.  Skin: Skin is warm and dry.  Nursing note and vitals reviewed.    ED Treatments /  Results  Labs (all labs ordered are listed, but only abnormal results are displayed) Labs Reviewed  COMPREHENSIVE METABOLIC PANEL - Abnormal; Notable for the following components:      Result Value   Glucose, Bld 145 (*)    Creatinine, Ser 1.55 (*)    ALT 14 (*)    GFR calc non Af Amer 43 (*)    GFR calc Af Amer 50 (*)    All other components within normal limits  CBC - Abnormal; Notable for the following components:   WBC 21.7 (*)    RDW 20.7 (*)    All other components within normal limits  CULTURE, BLOOD (ROUTINE X 2)  CULTURE, BLOOD (ROUTINE X 2)  URINALYSIS, ROUTINE W REFLEX MICROSCOPIC  I-STAT CG4 LACTIC ACID, ED  I-STAT TROPONIN, ED    EKG  EKG Interpretation  Date/Time:  Saturday June 12 2017 15:49:58 EST Ventricular Rate:  94 PR Interval:  142 QRS Duration: 93 QT Interval:  374 QTC Calculation: 468 R Axis:   41 Text Interpretation:  Sinus rhythm Abnormal R-wave progression, early transition LVH with secondary repolarization abnormality Confirmed by Rolland Porter ([id number]) on 06/12/2017 3:54:35 PM       Radiology Dg Chest 2 View  Result Date: 06/12/2017 CLINICAL DATA:  Central chest pain with shortness of breath for 1 month. History of hypertension, diabetes and COPD. EXAM: CHEST  2 VIEW COMPARISON:  Radiographs 05/03/2016 and 08/28/2014. FINDINGS: The heart size and mediastinal contours are stable. There is stable pulmonary hyperinflation with diffuse central airway and fissural thickening. No superimposed airspace disease, pleural effusion or pneumothorax demonstrated. Mild anterior wedging in the lower thoracic spine appears unchanged. IMPRESSION: Stable findings of chronic obstructive pulmonary disease. No acute cardiopulmonary process. Electronically Signed   By: Carey Bullocks M.D.   On: 06/12/2017 14:20    Procedures Procedures (including critical care time)  Medications Ordered in ED Medications  sodium chloride 0.9 % bolus 500 mL (not administered)      Initial Impression / Assessment and Plan / ED Course  I have reviewed the triage vital signs and the nursing notes.  Pertinent labs & imaging results that were available during my care of the patient were reviewed by me and considered in my medical decision making (see chart for details).     Patient in emergency department with fever, chills, cough, dysuria, urinary frequency and urgency.  Patient also had a fall this  morning, unclear if it syncopal episode.  Will check labs, urinalysis, will get CT head and cervical spine, chest x-ray.  Vital signs are normal at this time, will give small amount of fluid and monitor closely.  Patient's chest x-ray shows no signs of pneumonia.  I will give a breathing treatment for his cough.  Troponin and lactic acid negative.  White blood cell count is 21.7.  Urine analysis still pending.  No evidence of sepsis at this time, will continue to monitor.  Blood cultures obtained.  7:03 PM  Urine analysis shows large leukocytes, positive nitrites, too numerous to count white blood cells with rare bacteria.  Culture sent.  Consistent with urinary tract infection.  Placed on Rocephin.  Will admit for further evaluation. Spoke with hospitalist who will admit.   Vitals:   06/12/17 1329 06/12/17 1553 06/12/17 1745 06/12/17 1839  BP: 111/72 129/73 (!) 159/80   Pulse:  94 90   Resp: 20 18 17    Temp: 98.9 F (37.2 C)     TempSrc: Oral     SpO2: 97% 97% 98%   Weight:    89.8 kg (198 lb)  Height:    5\' 11"  (1.803 m)     Final Clinical Impressions(s) / ED Diagnoses   Final diagnoses:  Lower urinary tract infectious disease  Cough    ED Discharge Orders    None       Jaynie Crumble, PA-C 06/12/17 1906    Donnetta Hutching, MD 06/16/17 1009

## 2017-06-12 NOTE — ED Notes (Signed)
Attempted to call report x 1  

## 2017-06-12 NOTE — ED Triage Notes (Signed)
To ED for eval of sob for past few weeks also with chills for a few weeks- Noted low grade fevers at home over the past 2 days. Also with hematuria and lower back pain for the past few days. Pt able to speak in full sentences and resp don't appear to be labored - states he feels like he just cant catch his breath is sleeping a lot. Also with 'crazy headaches'.

## 2017-06-12 NOTE — H&P (Signed)
History and Physical    [name] MatarLarry Parks ZOX:[id number] DOB: Apr 27, 1946 DOA: 06/12/2017  PCP: [name], [place], MD  Patient coming from: Home  Chief Complaint: Dysuria, SOB  HPI: [name] MatarLarry Parks is a 72 y.o. male with medical history significant of DM2, PTSD, migraine, HTN, HLD, COPD who presents with SOB, hematuria, dysuria, foul smelling urine and frequency.  His wife and sister are at bedside and help with the history.  Per report, he has been having symptoms for about 3 days.  Further symptoms include hematuria, dizziness, lightheadedness.  He also passed out today and fell and hit his head on the floor and had LOC for about 2-3 minutes.  His wife did not notice any seizure like activity.  He has never had a urinary track infection before and he denies back pain.  Further symptoms include cough and SOB for about the same amount of time.  He has COPD and is a current smoker.  He further has been having headaches for months which are noted in his chart as migraines.  His wife notes that he awakens with them and uses tylenol and they improve.  He has a diagnosis of OSA, but does not use CPAP.    ED Course: In the ED, he was noted to have a Cr of 1.55 which was similar to the last check, but was normal before that time.  WBC of 21, UA with + nitrite, large Leuk and large blood.  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    Review of Systems: As per HPI otherwise 10 point review of systems negative.   Past Medical History:  Diagnosis Date  . CAP (community acquired pneumonia) 10/17/2013  . COPD (chronic obstructive pulmonary disease) (HCC)   . Hyperlipidemia   . Hypertension   . Migraines    "long time ago; none lately" (10/17/2013)  . OSA on CPAP   . PTSD (post-traumatic stress disorder)   . Shortness of breath dyspnea   . Type II diabetes mellitus (HCC)     Past Surgical History:  Procedure  Laterality Date  . EXCISIONAL HEMORRHOIDECTOMY  1970's?  Marland Kitchen. JOINT REPLACEMENT    . LEFT HEART CATHETERIZATION WITH CORONARY ANGIOGRAM N/A 09/21/2014   Procedure: LEFT HEART CATHETERIZATION WITH CORONARY ANGIOGRAM;  Surgeon: Peter M [place]az[place], MD;  [facility];  Service: Cardiovascular;  Laterality: N/A;  . TOTAL KNEE ARTHROPLASTY Right ~ 2012  . TOTAL KNEE ARTHROPLASTY Left ~ 2013   Reviewed with patient's wife.   reports that he has been smoking cigarettes.  He has a 50.00 pack-year smoking history. he has never used smokeless tobacco. He reports that he does not drink alcohol or use drugs.  Allergies  Allergen Reactions  . Paxil [Paroxetine Hcl]     [place] [place] med)  . Trazodone And Nefazodone   . Wellbutrin [Bupropion]    Reviewed with patient.  Family History  Problem Relation Age of Onset  . Diabetes Mother   . Heart attack Father   . Diabetes Sister     Prior to Admission medications   Medication Sig Start Date End Date Taking? Authorizing Provider  albuterol (PROVENTIL HFA;VENTOLIN HFA) 108 (90 BASE) MCG/ACT inhaler Inhale 2 puffs into the lungs every 4 (four) hours as needed for wheezing or shortness of breath. 05/08/14   Ardith DarkParker, Caleb M, MD  amLODipine (NORVASC) 5 MG tablet Take 1 tablet (5 mg total) by mouth daily. 08/31/14   Kathlen Mody, MD  aspirin EC 81 MG tablet Take 81 mg by mouth daily.    [provider]  atorvastatin (LIPITOR) 80 MG tablet Take 80 mg by mouth at bedtime.    [provider]  cholecalciferol (VITAMIN D) 1000 UNITS tablet Take 1,000 Units by mouth daily.    [provider]  flunisolide (NASALIDE) 25 MCG/ACT (0.025%) SOLN Place 2 sprays into the nose 2 (two) times daily as needed (for sinuses).    [provider]  gabapentin (NEURONTIN) 300 MG capsule Take 300-900 mg by mouth 3 (three) times daily. Take 300 mg every morning and at 2 pm and take 900 mg at bedtime     [provider]  HYDROcodone-acetaminophen (NORCO) 10-325 MG per tablet Take 1 tablet by mouth 4 (four) times daily as needed (for pain).    [provider]  insulin aspart (NOVOLOG) 100 UNIT/ML injection Inject 6 Units into the skin 3 (three) times daily with meals. Patient taking differently: Inject 4 Units into the skin 3 (three) times daily with meals. Pt uses sliding scale 10/20/13   Dellinger, Clerance Lav L, PA-C  insulin glargine (LANTUS) 100 UNIT/ML injection Inject 0.25 mLs (25 Units total) into the skin at bedtime. Patient taking differently: Inject 28 Units into the skin at bedtime.  10/20/13   Dellinger, Tora Kindred, PA-C  metFORMIN (GLUCOPHAGE) 500 MG tablet Take by mouth 2 (two) times daily with a meal.    [provider]  metoprolol tartrate (LOPRESSOR) 25 MG tablet Take 37.5 mg by mouth 2 (two) times daily.    [provider]  mirtazapine (REMERON) 15 MG tablet Take 7.5 mg by mouth at bedtime.    [provider]  Multiple Vitamins-Minerals (MULTIVITAMIN WITH MINERALS) tablet Take 1 tablet by mouth daily.    [provider]  omeprazole (PRILOSEC) 20 MG capsule Take 20 mg by mouth 2 (two) times daily.    [provider]  PARoxetine (PAXIL) 40 MG tablet Take 40 mg by mouth daily.    [provider]  potassium chloride SA (K-DUR,KLOR-CON) 20 MEQ tablet Take 20 mEq by mouth daily.    [provider]  senna-docusate (SENOKOT-S) 8.6-50 MG per tablet Take 2 tablets by mouth at bedtime.    [provider]  temazepam (RESTORIL) 30 MG capsule Take 30 mg by mouth at bedtime as needed for sleep.    [provider]  terazosin (HYTRIN) 10 MG capsule Take 10 mg by mouth at bedtime.    [provider]    Physical Exam: Vitals:   06/12/17 1830 06/12/17 1839 06/12/17 1945 06/12/17 2127  BP: 140/77  (!) 158/86 (!) 163/79  Pulse: 89  95 95  Resp: (!) 22  (!) 22 20  Temp:    98.4 F (36.9 C)    TempSrc:    Oral  SpO2: 100%  95% 100%  Weight:  198 lb (89.8 kg)  186 lb 15.2 oz (84.8 kg)  Height:  5\' 11"  (1.803 m)      Constitutional: NAD, calm, comfortable, thin elderly gentleman Vitals:   06/12/17 1830 06/12/17 1839 06/12/17 1945 06/12/17 2127  BP: 140/77  (!) 158/86 (!) 163/79  Pulse: 89  95 95  Resp: (!) 22  (!) 22 20  Temp:    98.4 F (36.9 C)  TempSrc:    Oral  SpO2: 100%  95% 100%  Weight:  198 lb (89.8 kg)  186 lb 15.2 oz (84.8 kg)  Height:  5\' 11"  (1.803 m)     Eyes: PERRL, lids and conjunctivae normal ENMT: Mucous membranes are dry. Posterior pharynx clear of any exudate or lesions. Neck: normal, supple Respiratory: clear to auscultation bilaterally, no wheezing, no crackles. Normal respiratory effort. No accessory muscle use. No CVA tenderness Cardiovascular: Regular rate and rhythm, no murmurs / rubs / gallops. No extremity edema.  Abdomen: no tenderness. No hepatosplenomegaly. Bowel sounds positive.  Musculoskeletal: no clubbing / cyanosis. Normal muscle tone.  Skin: no rashes, lesions, ulcers.  Neurologic: He was alert, but sleepy and fell asleep at times, moving all extremities.   Psychiatric: Alert to voice and oriented x 3. Normal mood.    Labs on Admission: I have personally reviewed following labs and imaging studies  CBC: Recent Labs  Lab 06/12/17 1337  WBC 21.7*  HGB 13.5  HCT 39.1  MCV 82.3  PLT 229   Basic Metabolic Panel: Recent Labs  Lab 06/12/17 1337  NA 138  K 3.9  CL 103  CO2 23  GLUCOSE 145*  BUN 13  CREATININE 1.55*  CALCIUM 9.5   GFR: Estimated Creatinine Clearance: 46.6 mL/min (A) (by C-G formula based on SCr of 1.55 mg/dL (H)). Liver Function Tests: Recent Labs  Lab 06/12/17 1337  AST 21  ALT 14*  ALKPHOS 107  BILITOT 0.7  PROT 7.3  ALBUMIN 3.5   No results for input(s): LIPASE, AMYLASE in the last 168 hours. No results for input(s): AMMONIA in the last 168 hours. Coagulation Profile: No results for  input(s): INR, PROTIME in the last 168 hours. Cardiac Enzymes: No results for input(s): CKTOTAL, CKMB, CKMBINDEX, TROPONINI in the last 168 hours. BNP (last 3 results) No results for input(s): PROBNP in the last 8760 hours. HbA1C: No results for input(s): HGBA1C in the last 72 hours. CBG: Recent Labs  Lab 06/12/17 2201  GLUCAP 117*   Lipid Profile: No results for input(s): CHOL, HDL, LDLCALC, TRIG, CHOLHDL, LDLDIRECT in the last 72 hours. Thyroid Function Tests: No results for input(s): TSH, T4TOTAL, FREET4, T3FREE, THYROIDAB in the last 72 hours. Anemia Panel: No results for input(s): VITAMINB12, FOLATE, FERRITIN, TIBC, IRON, RETICCTPCT in the last 72 hours. Urine analysis:    Component Value Date/Time   COLORURINE AMBER (A) 06/12/2017 1332   APPEARANCEUR CLOUDY (A) 06/12/2017 1332   LABSPEC 1.018 06/12/2017 1332   PHURINE 6.0 06/12/2017 1332   GLUCOSEU NEGATIVE 06/12/2017 1332   HGBUR LARGE (A) 06/12/2017 1332   BILIRUBINUR NEGATIVE 06/12/2017 1332   KETONESUR NEGATIVE 06/12/2017 1332   PROTEINUR 100 (A) 06/12/2017 1332   UROBILINOGEN 0.2 08/28/2014 1715   NITRITE POSITIVE (A) 06/12/2017 1332   LEUKOCYTESUR LARGE (A) 06/12/2017 1332    Radiological Exams on Admission: Dg Chest 2 View  Result Date: 06/12/2017 CLINICAL DATA:  Central chest pain with shortness of breath for 1 month. History of hypertension, diabetes and COPD. EXAM: CHEST  2 VIEW COMPARISON:  Radiographs 05/03/2016 and 08/28/2014. FINDINGS: The heart size and mediastinal contours are stable. There is stable pulmonary hyperinflation with diffuse central airway and fissural thickening. No superimposed airspace disease, pleural effusion or pneumothorax demonstrated. Mild anterior wedging in the lower thoracic spine appears unchanged. IMPRESSION: Stable findings of chronic obstructive pulmonary disease. No acute cardiopulmonary process. Electronically Signed   By: Carey Bullocks M.D.   On: 06/12/2017 14:20   Ct  Head Wo Contrast  Result Date: 06/12/2017 CLINICAL DATA:  72 year old male with history of syncope and fall today. No head injury. No headache. EXAM: CT HEAD WITHOUT CONTRAST CT CERVICAL SPINE WITHOUT CONTRAST TECHNIQUE: Multidetector CT imaging of the head and cervical spine was performed following the standard protocol without intravenous contrast. Multiplanar CT image reconstructions of the cervical spine were also generated. COMPARISON:  Head CT 08/28/2014. FINDINGS: CT HEAD FINDINGS Brain: Patchy and confluent areas of decreased attenuation are noted throughout the deep and periventricular white matter of the cerebral hemispheres bilaterally, compatible with chronic microvascular ischemic disease. No evidence of acute infarction, hemorrhage, hydrocephalus, extra-axial collection or mass lesion/mass effect. Vascular: No hyperdense vessel or unexpected calcification. Skull: Normal. Negative for fracture or focal lesion. Sinuses/Orbits: No acute finding. Other: None. CT CERVICAL SPINE FINDINGS Alignment: Normal. Skull base and vertebrae: No acute fracture. No primary bone lesion or focal pathologic process. Soft tissues and spinal canal: No prevertebral fluid or swelling. No visible canal hematoma. Disc levels: Moderate multilevel degenerative disc disease throughout the cervical spine. Mild multilevel facet arthropathy. Upper chest: Moderate centrilobular emphysema. Other: None. IMPRESSION: 1. No evidence of significant acute traumatic injury to the skull, brain or cervical spine. 2. Chronic microvascular ischemic changes in the cerebral white matter, as above. 3. Mild multilevel degenerative disc disease and cervical spondylosis. Electronically Signed   By: Trudie Reed M.D.   On: 06/12/2017 17:58   Ct Cervical Spine Wo Contrast  Result Date: 06/12/2017 CLINICAL DATA:  72 year old male with history of syncope and fall today. No head injury. No headache. EXAM: CT HEAD WITHOUT CONTRAST CT CERVICAL SPINE  WITHOUT CONTRAST TECHNIQUE: Multidetector CT imaging of the head and cervical spine was performed following the standard protocol without intravenous contrast. Multiplanar CT image reconstructions of the cervical spine were also generated. COMPARISON:  Head CT 08/28/2014. FINDINGS: CT HEAD FINDINGS Brain: Patchy and confluent areas of decreased attenuation are noted throughout the deep and periventricular white matter of the cerebral hemispheres bilaterally, compatible with chronic microvascular ischemic disease. No evidence of acute infarction, hemorrhage, hydrocephalus, extra-axial collection or mass lesion/mass effect. Vascular: No hyperdense vessel or unexpected calcification. Skull: Normal. Negative for fracture or focal lesion. Sinuses/Orbits: No acute finding. Other: None. CT CERVICAL SPINE FINDINGS Alignment: Normal. Skull base and vertebrae: No acute fracture. No primary bone lesion or focal pathologic process. Soft tissues and spinal canal: No prevertebral fluid or swelling. No visible canal hematoma. Disc levels: Moderate multilevel degenerative disc disease throughout the cervical spine. Mild multilevel facet arthropathy. Upper chest: Moderate centrilobular emphysema. Other: None. IMPRESSION: 1. No evidence of significant acute traumatic injury to the skull, brain or cervical spine. 2. Chronic microvascular ischemic changes in the cerebral white matter, as above. 3. Mild multilevel degenerative disc disease and cervical spondylosis. Electronically Signed   By: Trudie Reed M.D.   On: 06/12/2017 17:58    EKG: Independently reviewed. LVH with inverted Twaves.  These appear to be exaggerated from previous, likely related to acute illness.    Assessment/Plan Urinary tract infection - UA shows signs and given symptoms, likely a UTI - UC and BC X 2 pending - Rocephin started in the ED - IVF with NS at 100cc/hr for 10 hours (normal EF from TTE 2016) - Lactic acid normal - Repeat UA once  completed therapy to eval for persistent hematuria - Renal function appears stable, last  Time around 1 in 2016.  Trend BMET  SOB (shortness of breath) with h/o COPD - SOB noted by patient and cough - CXR without acute findings except  know COPD - No wheezing on exam - Duonebs and oxygen PRN - Monitor for any worsening    Diabetes mellitus - Continue lantus at half dose (14 units) and novolog - CBG monitoring    Hypertension - BP now ranging int he 150s - 160s systolic with fluids and Abx - Continue amlodipine and metoprolol, hold for low BP    Hyperlipidemia  - Continue atorvastatin    Headaches/migraine - Based on history, may be related to OSA - Tylenol for headache  PTSD - Continue mirtazapine, temazepam, paxil for symptoms  DVT prophylaxis: Lovenox Code Status: Full, confirmed with patient Family Communication: Wife Darla and Sister Dorthene at bedside Disposition Plan: Admit for IV abx, monitor cultures, possibly home tomorrow if does well Consults called: None Admission status: Obs, med surg   Debe Coder MD [facility] Pager [phone]  If 7PM-7AM, please contact night-coverage [email or web address] Password [facility]  06/12/2017, 10:47 PM

## 2017-06-13 DIAGNOSIS — B964 Proteus (mirabilis) (morganii) as the cause of diseases classified elsewhere: Secondary | ICD-10-CM | POA: Diagnosis present

## 2017-06-13 DIAGNOSIS — F17210 Nicotine dependence, cigarettes, uncomplicated: Secondary | ICD-10-CM | POA: Diagnosis present

## 2017-06-13 DIAGNOSIS — Z888 Allergy status to other drugs, medicaments and biological substances status: Secondary | ICD-10-CM | POA: Diagnosis not present

## 2017-06-13 DIAGNOSIS — J449 Chronic obstructive pulmonary disease, unspecified: Secondary | ICD-10-CM

## 2017-06-13 DIAGNOSIS — N390 Urinary tract infection, site not specified: Principal | ICD-10-CM

## 2017-06-13 DIAGNOSIS — Z833 Family history of diabetes mellitus: Secondary | ICD-10-CM | POA: Diagnosis not present

## 2017-06-13 DIAGNOSIS — Z794 Long term (current) use of insulin: Secondary | ICD-10-CM | POA: Diagnosis not present

## 2017-06-13 DIAGNOSIS — I10 Essential (primary) hypertension: Secondary | ICD-10-CM | POA: Diagnosis present

## 2017-06-13 DIAGNOSIS — Z79899 Other long term (current) drug therapy: Secondary | ICD-10-CM | POA: Diagnosis not present

## 2017-06-13 DIAGNOSIS — E119 Type 2 diabetes mellitus without complications: Secondary | ICD-10-CM

## 2017-06-13 DIAGNOSIS — M25512 Pain in left shoulder: Secondary | ICD-10-CM | POA: Diagnosis present

## 2017-06-13 DIAGNOSIS — F4310 Post-traumatic stress disorder, unspecified: Secondary | ICD-10-CM | POA: Diagnosis present

## 2017-06-13 DIAGNOSIS — R319 Hematuria, unspecified: Secondary | ICD-10-CM | POA: Diagnosis present

## 2017-06-13 DIAGNOSIS — E860 Dehydration: Secondary | ICD-10-CM | POA: Diagnosis present

## 2017-06-13 DIAGNOSIS — R05 Cough: Secondary | ICD-10-CM

## 2017-06-13 DIAGNOSIS — N179 Acute kidney failure, unspecified: Secondary | ICD-10-CM | POA: Diagnosis present

## 2017-06-13 DIAGNOSIS — G4733 Obstructive sleep apnea (adult) (pediatric): Secondary | ICD-10-CM | POA: Diagnosis present

## 2017-06-13 DIAGNOSIS — W1839XA Other fall on same level, initial encounter: Secondary | ICD-10-CM | POA: Diagnosis present

## 2017-06-13 DIAGNOSIS — G43909 Migraine, unspecified, not intractable, without status migrainosus: Secondary | ICD-10-CM | POA: Diagnosis present

## 2017-06-13 DIAGNOSIS — E785 Hyperlipidemia, unspecified: Secondary | ICD-10-CM | POA: Diagnosis present

## 2017-06-13 DIAGNOSIS — Z7982 Long term (current) use of aspirin: Secondary | ICD-10-CM | POA: Diagnosis not present

## 2017-06-13 DIAGNOSIS — Y92009 Unspecified place in unspecified non-institutional (private) residence as the place of occurrence of the external cause: Secondary | ICD-10-CM | POA: Diagnosis not present

## 2017-06-13 DIAGNOSIS — Z96653 Presence of artificial knee joint, bilateral: Secondary | ICD-10-CM | POA: Diagnosis present

## 2017-06-13 LAB — CBC
HCT: 35.2 % — ABNORMAL LOW (ref 39.0–52.0)
Hemoglobin: 12 g/dL — ABNORMAL LOW (ref 13.0–17.0)
MCH: 28 pg (ref 26.0–34.0)
MCHC: 34.1 g/dL (ref 30.0–36.0)
MCV: 82.2 fL (ref 78.0–100.0)
Platelets: 204 10*3/uL (ref 150–400)
RBC: 4.28 MIL/uL (ref 4.22–5.81)
RDW: 20.1 % — ABNORMAL HIGH (ref 11.5–15.5)
WBC: 14.4 10*3/uL — ABNORMAL HIGH (ref 4.0–10.5)

## 2017-06-13 LAB — BASIC METABOLIC PANEL
ANION GAP: 12 (ref 5–15)
BUN: 15 mg/dL (ref 6–20)
CALCIUM: 8.8 mg/dL — AB (ref 8.9–10.3)
CHLORIDE: 106 mmol/L (ref 101–111)
CO2: 22 mmol/L (ref 22–32)
CREATININE: 1.19 mg/dL (ref 0.61–1.24)
GFR calc non Af Amer: 60 mL/min — ABNORMAL LOW (ref >60)
GLUCOSE: 136 mg/dL — AB (ref 65–99)
Potassium: 3.5 mmol/L (ref 3.5–5.1)
Sodium: 140 mmol/L (ref 135–145)

## 2017-06-13 LAB — GLUCOSE, CAPILLARY
Glucose-Capillary: 131 mg/dL — ABNORMAL HIGH (ref 65–99)
Glucose-Capillary: 121 mg/dL — ABNORMAL HIGH (ref 65–99)
Glucose-Capillary: 124 mg/dL — ABNORMAL HIGH (ref 65–99)
Glucose-Capillary: 131 mg/dL — ABNORMAL HIGH (ref 65–99)

## 2017-06-13 LAB — HEMOGLOBIN A1C
HEMOGLOBIN A1C: 6.8 % — AB (ref 4.8–5.6)
Mean Plasma Glucose: 148.46 mg/dL

## 2017-06-13 MED ORDER — INSULIN ASPART 100 UNIT/ML ~~LOC~~ SOLN
0.0000 [IU] | Freq: Three times a day (TID) | SUBCUTANEOUS | Status: DC
  Administered 2017-06-13 – 2017-06-14 (×4): 1 [IU] via SUBCUTANEOUS
  Administered 2017-06-14: 2 [IU] via SUBCUTANEOUS

## 2017-06-13 MED ORDER — INSULIN ASPART 100 UNIT/ML ~~LOC~~ SOLN
0.0000 [IU] | Freq: Every day | SUBCUTANEOUS | Status: DC
  Administered 2017-06-14: 1 [IU] via SUBCUTANEOUS

## 2017-06-13 MED ORDER — IPRATROPIUM-ALBUTEROL 0.5-2.5 (3) MG/3ML IN SOLN: 3.0000 mL | RESPIRATORY_TRACT | Status: DC | PRN

## 2017-06-13 NOTE — Progress Notes (Signed)
Physical Therapy Evaluation Patient Details Name: [name] Parks MRN: [id number] DOB: [birth year] Today's Date: 06/13/2017   History of Present Illness  [name] is a 72 y/o male admitted on 06/12/17 with SOB, hematuria, dysuria with a syncopal episode and fall at home. Patient with a PMhx significant for DM2, PTSD, migraine, HTN, HLD, COPD, OSA.  Clinical Impression  Patient admitted with the above listed diagnosis. PTA, patient lived at home with his wife and was Mod I with ADLs and functional mobility with intermittent use of AD (SPC, RW). Today patient Mod I with bed mobility and Min Guard with all transfers and ambulation for general safety. Did require 1 seated rest break during ambulation due to feelings of weakness. PT to continue to follow patient acutely to maximize functional mobility prior to d/c.     Follow Up Recommendations Home health PT    Equipment Recommendations  None recommended by PT    Recommendations for Other Services       Precautions / Restrictions Precautions Precautions: Fall Restrictions Weight Bearing Restrictions: No      Mobility  Bed Mobility Overal bed mobility: Modified Independent             General bed mobility comments: some increased time and use of bed rails for supine to sit EOB  Transfers Overall transfer level: Needs assistance Equipment used: Rolling walker (2 wheeled) Transfers: Sit to/from Stand Sit to Stand: Min guard         General transfer comment: VC for hand placement for safety  Ambulation/Gait Ambulation/Gait assistance: Min guard Ambulation Distance (Feet): 80 Feet Assistive device: Rolling walker (2 wheeled) Gait Pattern/deviations: Step-through pattern;Decreased stride length;Trunk flexed   Gait velocity interpretation: Below normal speed for age/gender    Stairs            Wheelchair Mobility    Modified Rankin (Stroke Patients Only)       Balance Overall balance assessment: Needs  assistance Sitting-balance support: Feet supported;No upper extremity supported Sitting balance-Leahy Scale: Good Sitting balance - Comments: trunk flexed   Standing balance support: Bilateral upper extremity supported;During functional activity Standing balance-Leahy Scale: Fair Standing balance comment: required use of UE on RW for dynamic balance                             Pertinent Vitals/Pain Pain Assessment: No/denies pain    Home Living Family/patient expects to be discharged to:: Private residence Living Arrangements: Spouse/significant other Available Help at Discharge: Family Type of Home: House Home Access: Level entry     Home Layout: One level Home Equipment: Environmental consultant - 2 wheels;Cane - single point      Prior Function Level of Independence: Independent with assistive device(s)               Hand Dominance        Extremity/Trunk Assessment   Upper Extremity Assessment Upper Extremity Assessment: Defer to OT evaluation    Lower Extremity Assessment Lower Extremity Assessment: Generalized weakness       Communication   Communication: No difficulties  Cognition Arousal/Alertness: Awake/alert Behavior During Therapy: WFL for tasks assessed/performed Overall Cognitive Status: Within Functional Limits for tasks assessed                                        General Comments  Exercises     Assessment/Plan    PT Assessment Patient needs continued PT services  PT Problem List Decreased strength;Decreased activity tolerance;Decreased balance;Decreased mobility;Decreased knowledge of use of DME;Decreased safety awareness       PT Treatment Interventions DME instruction;Gait training;Functional mobility training;Therapeutic activities;Therapeutic exercise;Patient/family education    PT Goals (Current goals can be found in the Care Plan section)  Acute Rehab PT Goals Patient Stated Goal: return home PT Goal  Formulation: With patient Time For Goal Achievement: 06/27/17 Potential to Achieve Goals: Good    Frequency Min 3X/week   Barriers to discharge        Co-evaluation               AM-PAC PT "6 Clicks" Daily Activity  Outcome Measure Difficulty turning over in bed (including adjusting bedclothes, sheets and blankets)?: A Little Difficulty moving from lying on back to sitting on the side of the bed? : A Little Difficulty sitting down on and standing up from a chair with arms (e.g., wheelchair, bedside commode, etc,.)?: Unable Help needed moving to and from a bed to chair (including a wheelchair)?: A Little Help needed walking in hospital room?: A Little Help needed climbing 3-5 steps with a railing? : A Little 6 Click Score: 16    End of Session Equipment Utilized During Treatment: Gait belt Activity Tolerance: Patient tolerated treatment well Patient left: in chair;with call bell/phone within reach Nurse Communication: Mobility status PT Visit Diagnosis: Unsteadiness on feet (R26.81);Other abnormalities of gait and mobility (R26.89);Muscle weakness (generalized) (M62.81);History of falling (Z91.81)    Time: [id number] PT Time Calculation (min) (ACUTE ONLY): 22 min   Charges:   PT Evaluation $PT Eval Moderate Complexity: 1 Mod     PT G Codes:        Kipp LaurenceStephanie R Aaron, PT, DPT 06/13/17 12:30 PM

## 2017-06-13 NOTE — Progress Notes (Signed)
[facility]                                                                              Patient Demographics  [name] Parks, is a 72 y.o. male, DOB - 03/01/1946, ZOX:[id number]  Admit date - 06/12/2017   Admitting Physician Inez Catalina, MD  Outpatient Primary MD for the patient is [name], [name], MD  Outpatient specialists:   LOS - 0  days   Medical records reviewed and are as summarized below:    Chief Complaint  Patient presents with  . Fever  . Cough  . Hematuria       Brief summary   Patient is a 72 year old male with medical history significant of DM2, PTSD, migraine, HTN, HLD, COPD who presented with SOB, hematuria, dysuria, foul smelling urine and frequency for 3 days. He also reported hematuria, dizziness, lightheadedness, passed out on the day of admission. Denied any prior urinary tract infection. Also reported cough and shortness of breath in the last 3 days. Has a history of COPD and current smoker. Creatinine 1.5 at the time of admission, UA positive for UTI, WBCs 21K  Assessment & Plan    Principal Problem:   Lower urinary tract infection, symptomatic - Continue gentle hydration, lactic acid normal - Follow urine culture and blood cultures. - Continue IV Rocephin  Active Problems:   Diabetes mellitus (HCC) - Continue Lantus, NovoLog sliding scale, follow hemoglobin A1c, CBGs  Acute kidney injury - Likely due to #1, UTI, dehydration - Creatinine 1.5 at the time of admission, placed on IV fluid hydration, improved to 1.19.    Hypertension - Currently stable, continue Norvasc, metoprolol    Hyperlipidemia - Continue Lipitor    SOB (shortness of breath)With underlying history of COPD - Continue duo nebs, O2 supplementation - Chest x-ray negative for acute infiltrates.  History of PTSD Continue temazepam, mirtazapine, Paxil   Code Status: full  DVT Prophylaxis:  Family Communication: Discussed in detail with the patient, all  imaging results, lab results explained to the patient   Disposition Plan:  Time Spent in minutes  25 minutes  Procedures:  none  Consultants:   None   Antimicrobials:    IV Rocephin 1/19    Medications  Scheduled Meds: . amLODipine  5 mg Oral Daily  . aspirin EC  81 mg Oral Daily  . atorvastatin  80 mg Oral QHS  . cholecalciferol  1,000 Units Oral Daily  . enoxaparin (LOVENOX) injection  40 mg Subcutaneous Q24H  . fluticasone  2 spray Each Nare Daily  . gabapentin  300 mg Oral 2 times per day  . gabapentin  900 mg Oral QHS  . insulin aspart  0-5 Units Subcutaneous QHS  . insulin aspart  0-9 Units Subcutaneous TID WC  . insulin aspart  4 Units Subcutaneous TID WC  . insulin glargine  14 Units Subcutaneous QHS  . metoprolol tartrate  37.5 mg Oral BID  . mirtazapine  7.5 mg Oral QHS  . multivitamin with minerals  1 tablet Oral Daily  . pantoprazole  40 mg Oral Daily  . PARoxetine  40 mg Oral Daily  .  sodium chloride flush  3 mL Intravenous Q12H  . terazosin  10 mg Oral QHS   Continuous Infusions: . cefTRIAXone (ROCEPHIN)  IV     PRN Meds:.acetaminophen **OR** acetaminophen, HYDROcodone-acetaminophen, ipratropium-albuterol, ondansetron **OR** ondansetron (ZOFRAN) IV, senna-docusate, temazepam   Antibiotics   Anti-infectives (From admission, onward)   Start     Dose/Rate Route Frequency Ordered Stop   06/13/17 2000  cefTRIAXone (ROCEPHIN) 1 g in dextrose 5 % 50 mL IVPB     1 g 100 mL/hr over 30 Minutes Intravenous Every 24 hours 06/12/17 2012     06/12/17 1830  cefTRIAXone (ROCEPHIN) 1 g in dextrose 5 % 50 mL IVPB     1 g 100 mL/hr over 30 Minutes Intravenous  Once 06/12/17 1819 06/12/17 2021        Subjective:   [name] was seen and examined today. Feels slightly better today.  no fevers or chills Patient denies dizziness, chest pain, shortness of breath, abdominal pain, N/V/D/C. No acute events overnight.    Objective:   Vitals:   06/12/17  1945 06/12/17 2127 06/12/17 2318 06/13/17 0516  BP: (!) 158/86 (!) 163/79  (!) 100/58  Pulse: 95 95  (!) 51  Resp: (!) 22 20  18   Temp:  98.4 F (36.9 C)  98.5 F (36.9 C)  TempSrc:  Oral  Oral  SpO2: 95% 100% 95% 93%  Weight:  84.8 kg (186 lb 15.2 oz)    Height:        Intake/Output Summary (Last 24 hours) at 06/13/2017 0954 Last data filed at 06/13/2017 0544 Gross per 24 hour  Intake 1346.67 ml  Output 360 ml  Net 986.67 ml     Wt Readings from Last 3 Encounters:  06/12/17 84.8 kg (186 lb 15.2 oz)  05/03/16 88.5 kg (195 lb)  10/08/14 90.8 kg (200 lb 3.2 oz)     Exam  General: Alert and oriented x 3, NAD  Eyes  HEENT:  Atraumatic, normocephalic  Cardiovascular: S1 S2 auscultated, no rubs, murmurs or gallops. Regular rate and rhythm.  Respiratory: Decreased breath sounds    Gastrointestinal: Soft, nontender, nondistended, + bowel sounds  Ext: no pedal edema bilaterally  Neuro:  no new deficits  Musculoskeletal : No digital cyanosis, clubbing  Skin: No rashes  Psych: Normal affect and demeanor, alert and oriented x3    Data Reviewed:  I have personally reviewed following labs and imaging studies  Micro Results No results found for this or any previous visit (from the past 240 hour(s)).  Radiology Reports Dg Chest 2 View  Result Date: 06/12/2017 CLINICAL DATA:  Central chest pain with shortness of breath for 1 month. History of hypertension, diabetes and COPD. EXAM: CHEST  2 VIEW COMPARISON:  Radiographs 05/03/2016 and 08/28/2014. FINDINGS: The heart size and mediastinal contours are stable. There is stable pulmonary hyperinflation with diffuse central airway and fissural thickening. No superimposed airspace disease, pleural effusion or pneumothorax demonstrated. Mild anterior wedging in the lower thoracic spine appears unchanged. IMPRESSION: Stable findings of chronic obstructive pulmonary disease. No acute cardiopulmonary process. Electronically Signed    By: Carey Bullocks M.D.   On: 06/12/2017 14:20   Ct Head Wo Contrast  Result Date: 06/12/2017 CLINICAL DATA:  72 year old male with history of syncope and fall today. No head injury. No headache. EXAM: CT HEAD WITHOUT CONTRAST CT CERVICAL SPINE WITHOUT CONTRAST TECHNIQUE: Multidetector CT imaging of the head and cervical spine was performed following the standard protocol without intravenous contrast. Multiplanar CT  image reconstructions of the cervical spine were also generated. COMPARISON:  Head CT 08/28/2014. FINDINGS: CT HEAD FINDINGS Brain: Patchy and confluent areas of decreased attenuation are noted throughout the deep and periventricular white matter of the cerebral hemispheres bilaterally, compatible with chronic microvascular ischemic disease. No evidence of acute infarction, hemorrhage, hydrocephalus, extra-axial collection or mass lesion/mass effect. Vascular: No hyperdense vessel or unexpected calcification. Skull: Normal. Negative for fracture or focal lesion. Sinuses/Orbits: No acute finding. Other: None. CT CERVICAL SPINE FINDINGS Alignment: Normal. Skull base and vertebrae: No acute fracture. No primary bone lesion or focal pathologic process. Soft tissues and spinal canal: No prevertebral fluid or swelling. No visible canal hematoma. Disc levels: Moderate multilevel degenerative disc disease throughout the cervical spine. Mild multilevel facet arthropathy. Upper chest: Moderate centrilobular emphysema. Other: None. IMPRESSION: 1. No evidence of significant acute traumatic injury to the skull, brain or cervical spine. 2. Chronic microvascular ischemic changes in the cerebral white matter, as above. 3. Mild multilevel degenerative disc disease and cervical spondylosis. Electronically Signed   By: Trudie Reedaniel  Entrikin M.D.   On: 06/12/2017 17:58   Ct Cervical Spine Wo Contrast  Result Date: 06/12/2017 CLINICAL DATA:  72 year old male with history of syncope and fall today. No head injury. No  headache. EXAM: CT HEAD WITHOUT CONTRAST CT CERVICAL SPINE WITHOUT CONTRAST TECHNIQUE: Multidetector CT imaging of the head and cervical spine was performed following the standard protocol without intravenous contrast. Multiplanar CT image reconstructions of the cervical spine were also generated. COMPARISON:  Head CT 08/28/2014. FINDINGS: CT HEAD FINDINGS Brain: Patchy and confluent areas of decreased attenuation are noted throughout the deep and periventricular white matter of the cerebral hemispheres bilaterally, compatible with chronic microvascular ischemic disease. No evidence of acute infarction, hemorrhage, hydrocephalus, extra-axial collection or mass lesion/mass effect. Vascular: No hyperdense vessel or unexpected calcification. Skull: Normal. Negative for fracture or focal lesion. Sinuses/Orbits: No acute finding. Other: None. CT CERVICAL SPINE FINDINGS Alignment: Normal. Skull base and vertebrae: No acute fracture. No primary bone lesion or focal pathologic process. Soft tissues and spinal canal: No prevertebral fluid or swelling. No visible canal hematoma. Disc levels: Moderate multilevel degenerative disc disease throughout the cervical spine. Mild multilevel facet arthropathy. Upper chest: Moderate centrilobular emphysema. Other: None. IMPRESSION: 1. No evidence of significant acute traumatic injury to the skull, brain or cervical spine. 2. Chronic microvascular ischemic changes in the cerebral white matter, as above. 3. Mild multilevel degenerative disc disease and cervical spondylosis. Electronically Signed   By: Trudie Reedaniel  Entrikin M.D.   On: 06/12/2017 17:58    Lab Data:  CBC: Recent Labs  Lab 06/12/17 1337 06/12/17 2304 06/13/17 0805  WBC 21.7* 15.7* 14.4*  HGB 13.5 12.8* 12.0*  HCT 39.1 37.0* 35.2*  MCV 82.3 81.9 82.2  PLT 229 209 204   Basic Metabolic Panel: Recent Labs  Lab 06/12/17 1337 06/12/17 2304 06/13/17 0805  NA 138  --  140  K 3.9  --  3.5  CL 103  --  106  CO2  23  --  22  GLUCOSE 145*  --  136*  BUN 13  --  15  CREATININE 1.55* 1.31* 1.19  CALCIUM 9.5  --  8.8*   GFR: Estimated Creatinine Clearance: 60.6 mL/min (by C-G formula based on SCr of 1.19 mg/dL). Liver Function Tests: Recent Labs  Lab 06/12/17 1337  AST 21  ALT 14*  ALKPHOS 107  BILITOT 0.7  PROT 7.3  ALBUMIN 3.5   No results for input(s): LIPASE, AMYLASE  in the last 168 hours. No results for input(s): AMMONIA in the last 168 hours. Coagulation Profile: No results for input(s): INR, PROTIME in the last 168 hours. Cardiac Enzymes: No results for input(s): CKTOTAL, CKMB, CKMBINDEX, TROPONINI in the last 168 hours. BNP (last 3 results) No results for input(s): PROBNP in the last 8760 hours. HbA1C: No results for input(s): HGBA1C in the last 72 hours. CBG: Recent Labs  Lab 06/12/17 2201 06/13/17 0759  GLUCAP 117* 131*   Lipid Profile: No results for input(s): CHOL, HDL, LDLCALC, TRIG, CHOLHDL, LDLDIRECT in the last 72 hours. Thyroid Function Tests: No results for input(s): TSH, T4TOTAL, FREET4, T3FREE, THYROIDAB in the last 72 hours. Anemia Panel: No results for input(s): VITAMINB12, FOLATE, FERRITIN, TIBC, IRON, RETICCTPCT in the last 72 hours. Urine analysis:    Component Value Date/Time   COLORURINE AMBER (A) 06/12/2017 1332   APPEARANCEUR CLOUDY (A) 06/12/2017 1332   LABSPEC 1.018 06/12/2017 1332   PHURINE 6.0 06/12/2017 1332   GLUCOSEU NEGATIVE 06/12/2017 1332   HGBUR LARGE (A) 06/12/2017 1332   BILIRUBINUR NEGATIVE 06/12/2017 1332   KETONESUR NEGATIVE 06/12/2017 1332   PROTEINUR 100 (A) 06/12/2017 1332   UROBILINOGEN 0.2 08/28/2014 1715   NITRITE POSITIVE (A) 06/12/2017 1332   LEUKOCYTESUR LARGE (A) 06/12/2017 1332     Ripudeep Rai M.D. [facility] 06/13/2017, 9:54 AM  Pager: [phone] Between 7am to 7pm - call Pager - [phone]  After 7pm go to [email or web address] - password [email or web address]  Call night coverage person covering after 7pm

## 2017-06-13 NOTE — Progress Notes (Signed)
Received pt, alert and oriented. Ambulated to bed with some weakness. Vital signs within normal limits. Complains of urinary urgency and burning pain upon urination. Oriented to pt to room and  Safety ensured.

## 2017-06-14 DIAGNOSIS — N179 Acute kidney failure, unspecified: Secondary | ICD-10-CM

## 2017-06-14 LAB — CBC
HCT: 32.5 % — ABNORMAL LOW (ref 39.0–52.0)
Hemoglobin: 11.2 g/dL — ABNORMAL LOW (ref 13.0–17.0)
MCH: 28.3 pg (ref 26.0–34.0)
MCHC: 34.5 g/dL (ref 30.0–36.0)
MCV: 82.1 fL (ref 78.0–100.0)
Platelets: 221 10*3/uL (ref 150–400)
RBC: 3.96 MIL/uL — ABNORMAL LOW (ref 4.22–5.81)
RDW: 20.2 % — ABNORMAL HIGH (ref 11.5–15.5)
WBC: 10.3 10*3/uL (ref 4.0–10.5)

## 2017-06-14 LAB — BASIC METABOLIC PANEL
Anion gap: 10 (ref 5–15)
BUN: 12 mg/dL (ref 6–20)
CALCIUM: 8.8 mg/dL — AB (ref 8.9–10.3)
CHLORIDE: 105 mmol/L (ref 101–111)
CO2: 24 mmol/L (ref 22–32)
Creatinine, Ser: 1.06 mg/dL (ref 0.61–1.24)
GFR calc non Af Amer: >60 mL/min (ref >60)
GLUCOSE: 133 mg/dL — AB (ref 65–99)
Potassium: 3.2 mmol/L — ABNORMAL LOW (ref 3.5–5.1)
Sodium: 139 mmol/L (ref 135–145)

## 2017-06-14 LAB — URINE CULTURE: Culture: >=100000 — AB

## 2017-06-14 LAB — GLUCOSE, CAPILLARY
Glucose-Capillary: 127 mg/dL — ABNORMAL HIGH (ref 65–99)
Glucose-Capillary: 160 mg/dL — ABNORMAL HIGH (ref 65–99)

## 2017-06-14 MED ORDER — POTASSIUM CHLORIDE CRYS ER 20 MEQ PO TBCR
40.0000 meq | EXTENDED_RELEASE_TABLET | Freq: Once | ORAL | Status: AC
  Administered 2017-06-14: 40 meq via ORAL
  Filled 2017-06-14: qty 2

## 2017-06-14 MED ORDER — CEPHALEXIN 500 MG PO CAPS
500.0000 mg | ORAL_CAPSULE | Freq: Two times a day (BID) | ORAL | Status: DC
  Administered 2017-06-14: 500 mg via ORAL
  Filled 2017-06-14: qty 1

## 2017-06-14 MED ORDER — CEPHALEXIN 500 MG PO CAPS: 500.0000 mg | ORAL_CAPSULE | Freq: Two times a day (BID) | ORAL | 0 refills | Status: DC

## 2017-06-14 NOTE — Discharge Summary (Addendum)
Physician Discharge Summary   Patient ID: [name] MatarLarry Parks MRN: [id number] DOB/AGE: 01/01/1946 72 y.o.  Admit date: 06/12/2017 Discharge date: 06/14/2017  Primary Care Physician:  [name], [place], MD  Discharge Diagnoses:      Proteus mirabilis UTI . Hypertension . Hyperlipidemia . SOB (shortness of breath) . COPD (chronic obstructive pulmonary disease) (HCC) . Acute kidney injury (HCC)   Consults: none   Recommendations for Outpatient Follow-up:  1. Home health PT arranged by case manager  2. Please repeat CBC/BMET at next visit    DIET: carb modified  diet     Allergies:   Allergies  Allergen Reactions  . Paxil [Paroxetine Hcl]     [place] allergy [place] [facility] [place] (pt is prescribed this med)  . Trazodone And Nefazodone   . Wellbutrin [Bupropion]      DISCHARGE MEDICATIONS: Allergies as of 06/14/2017      Reactions   Paxil [paroxetine Hcl]    Pt reports allergy from [facility] paperwork (pt is prescribed this med)   Trazodone And Nefazodone    Wellbutrin [bupropion]       Medication List    TAKE these medications   acetaminophen 325 MG tablet Commonly known as:  TYLENOL Take 650 mg by mouth 3 (three) times daily as needed for mild pain.   albuterol 108 (90 Base) MCG/ACT inhaler Commonly known as:  PROVENTIL HFA;VENTOLIN HFA Inhale 2 puffs into the lungs every 4 (four) hours as needed for wheezing or shortness of breath.   alfuzosin 10 MG 24 hr tablet Commonly known as:  UROXATRAL Take 10 mg by mouth daily with breakfast.   atorvastatin 80 MG tablet Commonly known as:  LIPITOR Take 40 mg by mouth at bedtime.   cephALEXin 500 MG capsule Commonly known as:  KEFLEX Take 1 capsule (500 mg total) by mouth 2 (two) times daily. X 6 more days   chlorthalidone 25 MG tablet Commonly known as:  HYGROTON Take 12.5 mg by mouth daily.   cholecalciferol 1000 units tablet Commonly known as:  VITAMIN D Take 1,000 Units by mouth daily.   ferrous sulfate 325 (65  FE) MG tablet Take 325 mg by mouth 2 (two) times daily with a meal.   fluticasone 50 MCG/ACT nasal spray Commonly known as:  FLONASE Place 2 sprays into both nostrils daily.   insulin aspart 100 UNIT/ML injection Commonly known as:  novoLOG Inject 6 Units into the skin 3 (three) times daily with meals. What changed:    how much to take  additional instructions   insulin glargine 100 UNIT/ML injection Commonly known as:  LANTUS Inject 0.25 mLs (25 Units total) into the skin at bedtime.   metFORMIN 500 MG tablet Commonly known as:  GLUCOPHAGE Take by mouth 3 (three) times daily with meals.   metoprolol tartrate 25 MG tablet Commonly known as:  LOPRESSOR Take 25 mg by mouth 2 (two) times daily.   omeprazole 20 MG capsule Commonly known as:  PRILOSEC Take 20 mg by mouth 2 (two) times daily.   PARoxetine 40 MG tablet Commonly known as:  PAXIL Take 40 mg by mouth daily.   potassium chloride SA 20 MEQ tablet Commonly known as:  K-DUR,KLOR-CON Take 20 mEq by mouth daily.   senna-docusate 8.6-50 MG tablet Commonly known as:  Senokot-S Take 2 tablets by mouth at bedtime.   tamsulosin 0.4 MG Caps capsule Commonly known as:  FLOMAX Take 0.4 mg by mouth 2 (two) times daily.   Tiotropium Bromide-Olodaterol 2.5-2.5 MCG/ACT Aers  Inhale 2 puffs into the lungs daily.        Brief H and P: For complete details please refer to admission H and P, but in briefPatient is a 72 year old male with medical history significant ofDM2, PTSD, migraine, HTN, HLD, COPD who presented with SOB, hematuria, dysuria, foul smelling urine and frequency for 3 days. He also reported hematuria, dizziness, lightheadedness, passed out on the day of admission. Denied any prior urinary tract infection. Also reported cough and shortness of breath in the last 3 days. Has a history of COPD and current smoker. Creatinine 1.5 at the time of admission, UA positive for UTI, WBCs 21K  Assessment & Plan      Hospital Course:   Lower urinary tract infection, symptomatic - Patient was placed on IV fluid hydration, IV Rocephin -Urine culture and sensitivities were obtained, urine culture positive for Proteus mirabilis -Per sensitivities, transition to oral Keflex for 6 more days    Diabetes mellitus (HCC) -Hemoglobin A1c 6.8 -Resume outpatient regimen   Acute kidney injury - Likely due to #1, UTI, dehydration - Creatinine 1.5 at the time of admission, placed on IV fluid hydration, improved to 1.0 at the time of discharge    Hypertension - Currently stable, continue Norvasc, metoprolol    Hyperlipidemia - Continue Lipitor    SOB (shortness of breath)With underlying history of COPD - Continue duo nebs, O2 supplementation - Chest x-ray negative for acute infiltrates.  History of PTSD Continue temazepam, mirtazapine, Paxil     Day of Discharge BP 139/70 (BP Location: Right Arm)   Pulse 77   Temp 98.5 F (36.9 C) (Oral)   Resp 18   Ht 5\' 11"  (1.803 m)   Wt 84.8 kg (186 lb 15.2 oz)   SpO2 100%   BMI 26.07 kg/m   Physical Exam: General: Alert and awake oriented x3 not in any acute distress. HEENT: anicteric sclera, pupils reactive to light and accommodation CVS: S1-S2 clear no murmur rubs or gallops Chest: clear to auscultation bilaterally, no wheezing rales or rhonchi Abdomen: soft nontender, nondistended, normal bowel sounds Extremities: no cyanosis, clubbing or edema noted bilaterally Neuro: Cranial nerves II-XII intact, no focal neurological deficits   The results of significant diagnostics from this hospitalization (including imaging, microbiology, ancillary and laboratory) are listed below for reference.    LAB RESULTS: Basic Metabolic Panel: Recent Labs  Lab 06/13/17 0805 06/14/17 0746  NA 140 139  K 3.5 3.2*  CL 106 105  CO2 22 24  GLUCOSE 136* 133*  BUN 15 12  CREATININE 1.19 1.06  CALCIUM 8.8* 8.8*   Liver Function Tests: Recent Labs   Lab 06/12/17 1337  AST 21  ALT 14*  ALKPHOS 107  BILITOT 0.7  PROT 7.3  ALBUMIN 3.5   No results for input(s): LIPASE, AMYLASE in the last 168 hours. No results for input(s): AMMONIA in the last 168 hours. CBC: Recent Labs  Lab 06/13/17 0805 06/14/17 0746  WBC 14.4* 10.3  HGB 12.0* 11.2*  HCT 35.2* 32.5*  MCV 82.2 82.1  PLT 204 221   Cardiac Enzymes: No results for input(s): CKTOTAL, CKMB, CKMBINDEX, TROPONINI in the last 168 hours. BNP: Invalid input(s): POCBNP CBG: Recent Labs  Lab 06/14/17 0816 06/14/17 1159  GLUCAP 127* 160*    Significant Diagnostic Studies:  Dg Chest 2 View  Result Date: 06/12/2017 CLINICAL DATA:  Central chest pain with shortness of breath for 1 month. History of hypertension, diabetes and COPD. EXAM: CHEST  2 VIEW COMPARISON:  Radiographs 05/03/2016 and 08/28/2014. FINDINGS: The heart size and mediastinal contours are stable. There is stable pulmonary hyperinflation with diffuse central airway and fissural thickening. No superimposed airspace disease, pleural effusion or pneumothorax demonstrated. Mild anterior wedging in the lower thoracic spine appears unchanged. IMPRESSION: Stable findings of chronic obstructive pulmonary disease. No acute cardiopulmonary process. Electronically Signed   By: Carey Bullocks M.D.   On: 06/12/2017 14:20   Ct Head Wo Contrast  Result Date: 06/12/2017 CLINICAL DATA:  72 year old male with history of syncope and fall today. No head injury. No headache. EXAM: CT HEAD WITHOUT CONTRAST CT CERVICAL SPINE WITHOUT CONTRAST TECHNIQUE: Multidetector CT imaging of the head and cervical spine was performed following the standard protocol without intravenous contrast. Multiplanar CT image reconstructions of the cervical spine were also generated. COMPARISON:  Head CT 08/28/2014. FINDINGS: CT HEAD FINDINGS Brain: Patchy and confluent areas of decreased attenuation are noted throughout the deep and periventricular white matter of  the cerebral hemispheres bilaterally, compatible with chronic microvascular ischemic disease. No evidence of acute infarction, hemorrhage, hydrocephalus, extra-axial collection or mass lesion/mass effect. Vascular: No hyperdense vessel or unexpected calcification. Skull: Normal. Negative for fracture or focal lesion. Sinuses/Orbits: No acute finding. Other: None. CT CERVICAL SPINE FINDINGS Alignment: Normal. Skull base and vertebrae: No acute fracture. No primary bone lesion or focal pathologic process. Soft tissues and spinal canal: No prevertebral fluid or swelling. No visible canal hematoma. Disc levels: Moderate multilevel degenerative disc disease throughout the cervical spine. Mild multilevel facet arthropathy. Upper chest: Moderate centrilobular emphysema. Other: None. IMPRESSION: 1. No evidence of significant acute traumatic injury to the skull, brain or cervical spine. 2. Chronic microvascular ischemic changes in the cerebral white matter, as above. 3. Mild multilevel degenerative disc disease and cervical spondylosis. Electronically Signed   By: Trudie Reed M.D.   On: 06/12/2017 17:58   Ct Cervical Spine Wo Contrast  Result Date: 06/12/2017 CLINICAL DATA:  72 year old male with history of syncope and fall today. No head injury. No headache. EXAM: CT HEAD WITHOUT CONTRAST CT CERVICAL SPINE WITHOUT CONTRAST TECHNIQUE: Multidetector CT imaging of the head and cervical spine was performed following the standard protocol without intravenous contrast. Multiplanar CT image reconstructions of the cervical spine were also generated. COMPARISON:  Head CT 08/28/2014. FINDINGS: CT HEAD FINDINGS Brain: Patchy and confluent areas of decreased attenuation are noted throughout the deep and periventricular white matter of the cerebral hemispheres bilaterally, compatible with chronic microvascular ischemic disease. No evidence of acute infarction, hemorrhage, hydrocephalus, extra-axial collection or mass  lesion/mass effect. Vascular: No hyperdense vessel or unexpected calcification. Skull: Normal. Negative for fracture or focal lesion. Sinuses/Orbits: No acute finding. Other: None. CT CERVICAL SPINE FINDINGS Alignment: Normal. Skull base and vertebrae: No acute fracture. No primary bone lesion or focal pathologic process. Soft tissues and spinal canal: No prevertebral fluid or swelling. No visible canal hematoma. Disc levels: Moderate multilevel degenerative disc disease throughout the cervical spine. Mild multilevel facet arthropathy. Upper chest: Moderate centrilobular emphysema. Other: None. IMPRESSION: 1. No evidence of significant acute traumatic injury to the skull, brain or cervical spine. 2. Chronic microvascular ischemic changes in the cerebral white matter, as above. 3. Mild multilevel degenerative disc disease and cervical spondylosis. Electronically Signed   By: Trudie Reed M.D.   On: 06/12/2017 17:58    2D ECHO:   Disposition and Follow-up: Discharge Instructions    Diet Carb Modified   Complete by:  As directed    Discharge instructions   Complete by:  As directed    Please resume chlorthalidone and potasium pill from tomorrow 06/15/17   Increase activity slowly   Complete by:  As directed        DISPOSITION:  Home    DISCHARGE FOLLOW-UP Follow-up Information    [name], [name], MD. Schedule an appointment as soon as possible for a visit in 2 week(s).   Specialty:  Internal Medicine Contact information: [address] [place] [address] [phone]        [facility] - Dme Follow up.   Why:  Provide home health Contact information: [address] [place] [address] [phone]            Time spent on Discharge:   Signed:   Thad Ranger M.D. [facility] 06/14/2017, 4:14 PM Pager: [phone]

## 2017-06-14 NOTE — Progress Notes (Signed)
Physical Therapy Treatment Patient Details Name: [name] Parks MRN: [id number] DOB: [birth year] Today's Date: 06/14/2017    History of Present Illness [name] Parks is a 72 y/o male admitted on 06/12/17 with SOB, hematuria, dysuria with a syncopal episode and fall at home. Patient with a PMhx significant for DM2, PTSD, migraine, HTN, HLD, COPD, OSA.    PT Comments    Pt progressing towards physical therapy goals. Pt reports feeling near baseline of function, however still required supervision for safety with gait training. No gross unsteadiness noted with mobility, and no DOE/SOB noted with gait training. Will continue to follow and progress as able per POC.    Follow Up Recommendations  Home health PT     Equipment Recommendations  None recommended by PT    Recommendations for Other Services       Precautions / Restrictions Precautions Precautions: Fall Restrictions Weight Bearing Restrictions: No    Mobility  Bed Mobility Overal bed mobility: Modified Independent             General bed mobility comments: Modest increased time required but overall managing bed mobility well with no assistance required.   Transfers Overall transfer level: Needs assistance Equipment used: Rolling walker (2 wheeled) Transfers: Sit to/from Stand Sit to Stand: Supervision         General transfer comment: VC's for hand placement on seated surface for safety. Pt still attempted to pull up to stand from walker even with cues.   Ambulation/Gait Ambulation/Gait assistance: Supervision Ambulation Distance (Feet): 275 Feet Assistive device: Rolling walker (2 wheeled) Gait Pattern/deviations: Step-through pattern;Decreased stride length;Trunk flexed Gait velocity: Decreased Gait velocity interpretation: Below normal speed for age/gender General Gait Details: VC's for improved posture and general safety. Overall pt appeared steady with RW and no assist was required. Supervision for  safety.    Stairs            Wheelchair Mobility    Modified Rankin (Stroke Patients Only)       Balance Overall balance assessment: Needs assistance Sitting-balance support: Feet supported;No upper extremity supported Sitting balance-Leahy Scale: Good Sitting balance - Comments: trunk flexed   Standing balance support: Bilateral upper extremity supported;During functional activity Standing balance-Leahy Scale: Fair Standing balance comment: Fair to poor. Minimally reliant on UE support on walker for balance but feel he could manage some static standing activity without UE support (not tested this date however)                            Cognition Arousal/Alertness: Awake/alert Behavior During Therapy: WFL for tasks assessed/performed Overall Cognitive Status: Within Functional Limits for tasks assessed                                        Exercises General Exercises - Lower Extremity Long Arc Quad: 15 reps;Both    General Comments        Pertinent Vitals/Pain Pain Assessment: No/denies pain    Home Living                      Prior Function            PT Goals (current goals can now be found in the care plan section) Acute Rehab PT Goals Patient Stated Goal: return home today PT Goal Formulation: With patient Time For Goal Achievement: 06/27/17 Potential  to Achieve Goals: Good Progress towards PT goals: Progressing toward goals    Frequency    Min 3X/week      PT Plan Current plan remains appropriate    Co-evaluation              AM-PAC PT "6 Clicks" Daily Activity  Outcome Measure  Difficulty turning over in bed (including adjusting bedclothes, sheets and blankets)?: A Little Difficulty moving from lying on back to sitting on the side of the bed? : A Little Difficulty sitting down on and standing up from a chair with arms (e.g., wheelchair, bedside commode, etc,.)?: A Little Help needed moving  to and from a bed to chair (including a wheelchair)?: A Little Help needed walking in hospital room?: A Little Help needed climbing 3-5 steps with a railing? : A Little 6 Click Score: 18    End of Session Equipment Utilized During Treatment: Gait belt Activity Tolerance: Patient tolerated treatment well Patient left: in chair;with call bell/phone within reach Nurse Communication: Mobility status PT Visit Diagnosis: Unsteadiness on feet (R26.81);Other abnormalities of gait and mobility (R26.89);Muscle weakness (generalized) (M62.81);History of falling (Z91.81)     Time: [id number] PT Time Calculation (min) (ACUTE ONLY): 21 min  Charges:  $Gait Training: 8-22 mins                    G Codes:       [name] Parks, PT, DPT Acute Rehabilitation Services Pager: [phone]    [name] Parks 06/14/2017, 9:38 AM

## 2017-06-14 NOTE — Care Management Note (Signed)
Case Management Note  Patient Details  Name: Robb MatarLarry Hue MRN: [id number] Date of Birth: 01/05/1946  Subjective/Objective:                    Action/Plan:  Patient wants to use his medicare to arrange [facility] health not [facility] Expected Discharge Date:  06/14/17               Expected Discharge Plan:  Home w Home Health Services  In-House Referral:     Discharge planning Services  CM Consult  Post Acute Care Choice:  Home Health Choice offered to:  Patient  DME Arranged:  N/A DME Agency:  NA  HH Arranged:  PT, OT, Nurse's Aide HH Agency:  [facility]  Status of Service:  Completed, signed off  If discussed at Long Length of Stay Meetings, dates discussed:    Additional Comments:  Kingsley PlanWile, Viridiana Spaid Marie, RN 06/14/2017, 1:31 PM

## 2017-06-17 LAB — CULTURE, BLOOD (ROUTINE X 2)
CULTURE: NO GROWTH
Culture: NO GROWTH
SPECIAL REQUESTS: ADEQUATE
Special Requests: ADEQUATE

## 2018-02-24 ENCOUNTER — Emergency Department (HOSPITAL_COMMUNITY)
Admission: EM | Admit: 2018-02-24 | Discharge: 2018-02-24 | Disposition: A | Discharge status: Home / Self Care | Payer: Non-veteran care | Attending: Emergency Medicine | Admitting: Emergency Medicine

## 2018-02-24 ENCOUNTER — Emergency Department (HOSPITAL_COMMUNITY): Payer: Non-veteran care

## 2018-02-24 ENCOUNTER — Other Ambulatory Visit: Payer: Self-pay

## 2018-02-24 ENCOUNTER — Encounter (HOSPITAL_COMMUNITY): Payer: Self-pay

## 2018-02-24 DIAGNOSIS — E119 Type 2 diabetes mellitus without complications: Secondary | ICD-10-CM | POA: Insufficient documentation

## 2018-02-24 DIAGNOSIS — Z794 Long term (current) use of insulin: Secondary | ICD-10-CM | POA: Diagnosis not present

## 2018-02-24 DIAGNOSIS — Z96653 Presence of artificial knee joint, bilateral: Secondary | ICD-10-CM | POA: Insufficient documentation

## 2018-02-24 DIAGNOSIS — J441 Chronic obstructive pulmonary disease with (acute) exacerbation: Secondary | ICD-10-CM

## 2018-02-24 DIAGNOSIS — R05 Cough: Secondary | ICD-10-CM | POA: Diagnosis present

## 2018-02-24 DIAGNOSIS — I10 Essential (primary) hypertension: Secondary | ICD-10-CM | POA: Insufficient documentation

## 2018-02-24 DIAGNOSIS — Z79899 Other long term (current) drug therapy: Secondary | ICD-10-CM | POA: Insufficient documentation

## 2018-02-24 DIAGNOSIS — F17210 Nicotine dependence, cigarettes, uncomplicated: Secondary | ICD-10-CM | POA: Diagnosis not present

## 2018-02-24 LAB — COMPREHENSIVE METABOLIC PANEL
ALK PHOS: 96 U/L (ref 38–126)
ALT: 17 U/L (ref 0–44)
AST: 23 U/L (ref 15–41)
Albumin: 3.6 g/dL (ref 3.5–5.0)
Anion gap: 11 (ref 5–15)
BILIRUBIN TOTAL: 0.9 mg/dL (ref 0.3–1.2)
BUN: 8 mg/dL (ref 8–23)
CALCIUM: 9.5 mg/dL (ref 8.9–10.3)
CO2: 26 mmol/L (ref 22–32)
CREATININE: 1.23 mg/dL (ref 0.61–1.24)
Chloride: 102 mmol/L (ref 98–111)
GFR calc non Af Amer: 57 mL/min — ABNORMAL LOW (ref >60)
Glucose, Bld: 165 mg/dL — ABNORMAL HIGH (ref 70–99)
Potassium: 3.2 mmol/L — ABNORMAL LOW (ref 3.5–5.1)
Sodium: 139 mmol/L (ref 135–145)
Total Protein: 7.5 g/dL (ref 6.5–8.1)

## 2018-02-24 LAB — URINALYSIS, ROUTINE W REFLEX MICROSCOPIC
Bilirubin Urine: NEGATIVE
GLUCOSE, UA: NEGATIVE mg/dL
Hgb urine dipstick: NEGATIVE
KETONES UR: NEGATIVE mg/dL
LEUKOCYTES UA: NEGATIVE
Nitrite: NEGATIVE
PH: 6 (ref 5.0–8.0)
Protein, ur: NEGATIVE mg/dL
Specific Gravity, Urine: 1.009 (ref 1.005–1.030)

## 2018-02-24 LAB — CBC WITH DIFFERENTIAL/PLATELET
Abs Immature Granulocytes: 0 10*3/uL (ref 0.0–0.1)
BASOS PCT: 1 %
Basophils Absolute: 0.1 10*3/uL (ref 0.0–0.1)
EOS ABS: 0.1 10*3/uL (ref 0.0–0.7)
EOS PCT: 1 %
HCT: 41.3 % (ref 39.0–52.0)
Hemoglobin: 13.9 g/dL (ref 13.0–17.0)
IMMATURE GRANULOCYTES: 0 %
Lymphocytes Relative: 13 %
Lymphs Abs: 1.1 10*3/uL (ref 0.7–4.0)
MCH: 30.4 pg (ref 26.0–34.0)
MCHC: 33.7 g/dL (ref 30.0–36.0)
MCV: 90.4 fL (ref 78.0–100.0)
Monocytes Absolute: 0.9 10*3/uL (ref 0.1–1.0)
Monocytes Relative: 10 %
NEUTROS PCT: 75 %
Neutro Abs: 6.3 10*3/uL (ref 1.7–7.7)
PLATELETS: 221 10*3/uL (ref 150–400)
RBC: 4.57 MIL/uL (ref 4.22–5.81)
RDW: 14.2 % (ref 11.5–15.5)
WBC: 8.4 10*3/uL (ref 4.0–10.5)

## 2018-02-24 LAB — I-STAT CG4 LACTIC ACID, ED: LACTIC ACID, VENOUS: 1.77 mmol/L (ref 0.5–1.9)

## 2018-02-24 MED ORDER — ACETAMINOPHEN 500 MG PO TABS
1000.0000 mg | ORAL_TABLET | Freq: Once | ORAL | Status: AC
  Administered 2018-02-24: 1000 mg via ORAL
  Filled 2018-02-24: qty 2

## 2018-02-24 MED ORDER — IOPAMIDOL (ISOVUE-370) INJECTION 76%
100.0000 mL | Freq: Once | INTRAVENOUS | Status: AC | PRN
  Administered 2018-02-24: 100 mL via INTRAVENOUS

## 2018-02-24 MED ORDER — SODIUM CHLORIDE 0.9 % IV BOLUS
1000.0000 mL | Freq: Once | INTRAVENOUS | Status: AC
  Administered 2018-02-24: 1000 mL via INTRAVENOUS

## 2018-02-24 MED ORDER — DOXYCYCLINE HYCLATE 100 MG PO TABS
100.0000 mg | ORAL_TABLET | Freq: Once | ORAL | Status: AC
  Administered 2018-02-24: 100 mg via ORAL
  Filled 2018-02-24: qty 1

## 2018-02-24 MED ORDER — IPRATROPIUM BROMIDE 0.02 % IN SOLN
0.5000 mg | Freq: Once | RESPIRATORY_TRACT | Status: AC
  Administered 2018-02-24: 0.5 mg via RESPIRATORY_TRACT
  Filled 2018-02-24: qty 2.5

## 2018-02-24 MED ORDER — ALBUTEROL SULFATE HFA 108 (90 BASE) MCG/ACT IN AERS
2.0000 | INHALATION_SPRAY | RESPIRATORY_TRACT | Status: DC
  Administered 2018-02-24: 2 via RESPIRATORY_TRACT
  Filled 2018-02-24: qty 6.7

## 2018-02-24 MED ORDER — ALBUTEROL SULFATE (2.5 MG/3ML) 0.083% IN NEBU
5.0000 mg | INHALATION_SOLUTION | Freq: Once | RESPIRATORY_TRACT | Status: AC
  Administered 2018-02-24: 5 mg via RESPIRATORY_TRACT
  Filled 2018-02-24: qty 6

## 2018-02-24 MED ORDER — DOXYCYCLINE HYCLATE 100 MG PO CAPS: 100.0000 mg | ORAL_CAPSULE | Freq: Two times a day (BID) | ORAL | 0 refills | Status: DC

## 2018-02-24 MED ORDER — METHYLPREDNISOLONE SODIUM SUCC 125 MG IJ SOLR
125.0000 mg | Freq: Once | INTRAMUSCULAR | Status: AC
  Administered 2018-02-24: 125 mg via INTRAVENOUS
  Filled 2018-02-24: qty 2

## 2018-02-24 MED ORDER — PREDNISONE 20 MG PO TABS: 40.0000 mg | ORAL_TABLET | Freq: Every day | ORAL | 0 refills | Status: AC

## 2018-02-24 NOTE — ED Notes (Signed)
Pt does not have to go to the bathroom at this time. Still aware we need a UA. Pt tachy to 125 so temp was retaken.

## 2018-02-24 NOTE — ED Notes (Signed)
Patient transported to CT 

## 2018-02-24 NOTE — ED Notes (Signed)
Pt stable, ambulatory, states understanding of discharge instructions 

## 2018-02-24 NOTE — ED Triage Notes (Signed)
Pt endorses productive cough with green mucous x 2 days and chills. Afebrile in triage. Denies shob or CP. Tachy.

## 2018-02-24 NOTE — ED Notes (Signed)
Urine culture collected in addition to UA.

## 2018-02-24 NOTE — Discharge Instructions (Addendum)
Please call your pulmonologist at the [facility] hospital for further evaluation.

## 2018-02-24 NOTE — ED Notes (Signed)
Pt notified that he needs to provide a urine sample when possible; Pt verbalized understanding.  Due to Pt's tachycardia (114-120bpm upon arrival in room) put on 5-lead.

## 2018-03-02 NOTE — ED Provider Notes (Signed)
[facility] EMERGENCY DEPARTMENT Provider Note   CSN: [id number] Arrival date & time: 02/24/18  1034     History   Chief Complaint Chief Complaint  Patient presents with  . Cough    HPI [name] Parks is a 72 y.o. male.  HPI Patient is a 72 year old male with a history of COPD hypertension, hyperlipidemia presents the emergency department 2 days of productive cough and chills without documented fever.  Denies significant shortness of breath.  No orthopnea.  Denies palpitations.  No abdominal pain.  No back or flank pain.  No weakness.  No altered mental status.  Symptoms are mild in severity.   Past Medical History:  Diagnosis Date  . CAP (community acquired pneumonia) 10/17/2013  . COPD (chronic obstructive pulmonary disease) (HCC)   . Hyperlipidemia   . Hypertension   . Migraines    "long time ago; none lately" (10/17/2013)  . OSA on CPAP   . PTSD (post-traumatic stress disorder)   . Shortness of breath dyspnea   . Type II diabetes mellitus [facility])     Patient Active Problem List   Diagnosis Date Noted  . Acute kidney injury (HCC) 06/13/2017  . Lower urinary tract infectious disease 06/12/2017  . Abnormal nuclear stress test 09/21/2014  . Near syncope   . Atypical chest pain   . Diarrhea 08/29/2014  . Tobacco abuse 08/29/2014  . Elevated troponin 08/29/2014  . Dehydration 08/29/2014  . COPD (chronic obstructive pulmonary disease) (HCC) 08/29/2014  . Syncope 08/29/2014  . Diabetes mellitus without complication (HCC)   . SOB (shortness of breath)   . Dyspnea 05/07/2014  . HCAP (healthcare-associated pneumonia) 10/17/2013  . COPD exacerbation (HCC) 10/17/2013  . Leukocytosis 10/17/2013  . Nicotine abuse 10/17/2013  . Diabetes mellitus (HCC) 10/17/2013  . Hypertension 10/17/2013  . Hyperlipidemia 10/17/2013    Past Surgical History:  Procedure Laterality Date  . EXCISIONAL HEMORRHOIDECTOMY  1970's?  Marland Kitchen JOINT REPLACEMENT    . LEFT HEART  CATHETERIZATION WITH CORONARY ANGIOGRAM N/A 09/21/2014   Procedure: LEFT HEART CATHETERIZATION WITH CORONARY ANGIOGRAM;  Surgeon: Peter M Swaziland, MD;  Location: [facility] CATH LAB;  Service: Cardiovascular;  Laterality: N/A;  . TOTAL KNEE ARTHROPLASTY Right ~ 2012  . TOTAL KNEE ARTHROPLASTY Left ~ 2013        Home Medications    Prior to Admission medications   Medication Sig Start Date End Date Taking? Authorizing Provider  acetaminophen (TYLENOL) 325 MG tablet Take 650 mg by mouth 3 (three) times daily as needed for mild pain.   Yes [provider]  albuterol (PROVENTIL HFA;VENTOLIN HFA) 108 (90 BASE) MCG/ACT inhaler Inhale 2 puffs into the lungs every 4 (four) hours as needed for wheezing or shortness of breath. 05/08/14  Yes Ardith Dark, MD  alfuzosin (UROXATRAL) 10 MG 24 hr tablet Take 10 mg by mouth daily with breakfast.   Yes [provider]  atorvastatin (LIPITOR) 80 MG tablet Take 40 mg by mouth at bedtime.    Yes [provider]  chlorthalidone (HYGROTON) 25 MG tablet Take 12.5 mg by mouth daily.   Yes [provider]  cholecalciferol (VITAMIN D) 1000 UNITS tablet Take 1,000 Units by mouth daily.   Yes [provider]  ferrous sulfate 325 (65 FE) MG tablet Take 325 mg by mouth 2 (two) times daily with a meal.   Yes [provider]  fluticasone (FLONASE) 50 MCG/ACT nasal spray Place 2 sprays into both nostrils daily as needed for allergies.  Yes [provider]  insulin aspart (NOVOLOG) 100 UNIT/ML injection Inject 6 Units into the skin 3 (three) times daily with meals. Patient taking differently: Inject 4 Units into the skin 3 (three) times daily with meals.  10/20/13  Yes Dellinger, Tora Kindred, PA-C  insulin glargine (LANTUS) 100 UNIT/ML injection Inject 0.25 mLs (25 Units total) into the skin at bedtime. 10/20/13  Yes Dellinger, Tora Kindred, PA-C  metFORMIN (GLUCOPHAGE) 500 MG tablet Take 500 mg by mouth 2 (two) times daily  with a meal.    Yes [provider]  metoprolol tartrate (LOPRESSOR) 25 MG tablet Take 25 mg by mouth 2 (two) times daily.    Yes [provider]  Multiple Vitamin (MULTIVITAMIN) tablet Take 1 tablet by mouth daily. Centrum Silver   Yes [provider]  omeprazole (PRILOSEC) 20 MG capsule Take 20 mg by mouth 2 (two) times daily.   Yes [provider]  PARoxetine (PAXIL) 40 MG tablet Take 40 mg by mouth daily.   Yes [provider]  potassium chloride SA (K-DUR,KLOR-CON) 20 MEQ tablet Take 20 mEq by mouth daily.   Yes [provider]  senna-docusate (SENOKOT-S) 8.6-50 MG per tablet Take 2 tablets by mouth at bedtime.   Yes [provider]  tamsulosin (FLOMAX) 0.4 MG CAPS capsule Take 0.4 mg by mouth 2 (two) times daily.   Yes [provider]  Tiotropium Bromide-Olodaterol 2.5-2.5 MCG/ACT AERS Inhale 2 puffs into the lungs daily.   Yes [provider]  doxycycline (VIBRAMYCIN) 100 MG capsule Take 1 capsule (100 mg total) by mouth 2 (two) times daily. 02/24/18   Azalia Bilis, MD    Family History Family History  Problem Relation Age of Onset  . Diabetes Mother   . Heart attack Father   . Diabetes Sister     Social History Social History   Tobacco Use  . Smoking status: Current Every Day Smoker    Packs/day: 1.00    Years: 50.00    Pack years: 50.00    Types: Cigarettes  . Smokeless tobacco: Never Used  Substance Use Topics  . Alcohol use: No    Comment: "used to drink a long time ago; none since the 1970's"  . Drug use: No     Allergies   Paxil [paroxetine hcl]; Trazodone and nefazodone; and Wellbutrin [bupropion]   Review of Systems Review of Systems  All other systems reviewed and are negative.    Physical Exam Updated Vital Signs BP (!) 159/76   Pulse (!) 120   Temp 99.4 F (37.4 C) (Oral)   Resp 19   Ht 5\' 11"  (1.803 m)   Wt 84.8 kg   SpO2 97%   BMI 26.08 kg/m   Physical Exam    Constitutional: He is oriented to person, place, and time. He appears well-developed and well-nourished.  HENT:  Head: Normocephalic and atraumatic.  Eyes: EOM are normal.  Neck: Normal range of motion.  Cardiovascular: Regular rhythm, normal heart sounds and intact distal pulses.  Tachycardia  Pulmonary/Chest: Effort normal and breath sounds normal. No respiratory distress.  Abdominal: Soft. He exhibits no distension. There is no tenderness.  Musculoskeletal: Normal range of motion.  Neurological: He is alert and oriented to person, place, and time.  Skin: Skin is warm and dry.  Psychiatric: He has a normal mood and affect. Judgment normal.  Nursing note and vitals reviewed.    ED Treatments / Results  Labs (all labs ordered are listed, but only  abnormal results are displayed) Labs Reviewed  COMPREHENSIVE METABOLIC PANEL - Abnormal; Notable for the following components:      Result Value   Potassium 3.2 (*)    Glucose, Bld 165 (*)    GFR calc non Af Amer 57 (*)    All other components within normal limits  CBC WITH DIFFERENTIAL/PLATELET  URINALYSIS, ROUTINE W REFLEX MICROSCOPIC  I-STAT CG4 LACTIC ACID, ED    EKG None  Radiology Chest x-ray: No acute abnormality  CT angio chest demonstrates no evidence of pulmonary embolism.  Emphysema.  Pulmonary nodule.  (Patient informed of pulmonary nodule need for follow-up CT imaging in 6 to 12 months)  Procedures Procedures (including critical care time)  Medications Ordered in ED Medications  albuterol (PROVENTIL) (2.5 MG/3ML) 0.083% nebulizer solution 5 mg (5 mg Nebulization Given 02/24/18 1256)  ipratropium (ATROVENT) nebulizer solution 0.5 mg (0.5 mg Nebulization Given 02/24/18 1256)  doxycycline (VIBRA-TABS) tablet 100 mg (100 mg Oral Given 02/24/18 1255)  methylPREDNISolone sodium succinate (SOLU-MEDROL) 125 mg/2 mL injection 125 mg (125 mg Intravenous Given 02/24/18 1307)  sodium chloride 0.9 % bolus 1,000 mL (0 mLs  Intravenous Stopped 02/24/18 1409)  iopamidol (ISOVUE-370) 76 % injection 100 mL (100 mLs Intravenous Contrast Given 02/24/18 1545)  acetaminophen (TYLENOL) tablet 1,000 mg (1,000 mg Oral Given 02/24/18 1650)     Initial Impression / Assessment and Plan / ED Course  I have reviewed the triage vital signs and the nursing notes.  Pertinent labs & imaging results that were available during my care of the patient were reviewed by me and considered in my medical decision making (see chart for details).     Patient given fluids.  Acute bronchitis-like symptoms.  Chest x-ray and CT imaging without focal pneumonia.  Patient given doxy given his history of COPD for acute bronchitis.  Improved with bronchodilators.  Feels much better.  Likely COPD exacerbation with bronchitis.  Despite fluids his heart rate remained elevated in the 110-118 range.  I do not feel the patient is septic at this time.  I feel like he is stable for discharge.  He understands return to the ER for new or worsening symptoms.  Close primary care follow-up.  Final Clinical Impressions(s) / ED Diagnoses   Final diagnoses:  COPD exacerbation [facility])    ED Discharge Orders         Ordered    predniSONE (DELTASONE) 20 MG tablet  Daily     02/24/18 1736    doxycycline (VIBRAMYCIN) 100 MG capsule  2 times daily     02/24/18 1736           Azalia Bilis, MD 03/02/18 [id number]

## 2018-05-09 ENCOUNTER — Encounter: Payer: Self-pay | Admitting: Podiatry

## 2018-05-09 ENCOUNTER — Ambulatory Visit (INDEPENDENT_AMBULATORY_CARE_PROVIDER_SITE_OTHER): Payer: No Typology Code available for payment source

## 2018-05-09 ENCOUNTER — Ambulatory Visit (INDEPENDENT_AMBULATORY_CARE_PROVIDER_SITE_OTHER): Payer: No Typology Code available for payment source | Admitting: Podiatry

## 2018-05-09 VITALS — BP 141/72 | HR 83

## 2018-05-09 DIAGNOSIS — E1159 Type 2 diabetes mellitus with other circulatory complications: Secondary | ICD-10-CM

## 2018-05-09 DIAGNOSIS — B351 Tinea unguium: Secondary | ICD-10-CM | POA: Diagnosis not present

## 2018-05-09 DIAGNOSIS — M19071 Primary osteoarthritis, right ankle and foot: Secondary | ICD-10-CM

## 2018-05-09 DIAGNOSIS — M722 Plantar fascial fibromatosis: Secondary | ICD-10-CM

## 2018-05-09 DIAGNOSIS — M79675 Pain in left toe(s): Secondary | ICD-10-CM

## 2018-05-09 DIAGNOSIS — M2142 Flat foot [pes planus] (acquired), left foot: Secondary | ICD-10-CM

## 2018-05-09 DIAGNOSIS — M19072 Primary osteoarthritis, left ankle and foot: Secondary | ICD-10-CM

## 2018-05-09 DIAGNOSIS — M2141 Flat foot [pes planus] (acquired), right foot: Secondary | ICD-10-CM

## 2018-05-09 DIAGNOSIS — M79674 Pain in right toe(s): Secondary | ICD-10-CM | POA: Diagnosis not present

## 2018-05-09 NOTE — Progress Notes (Signed)
This patient presents to the office with chief complaint of pain noted through the bottoms of both of his feet he says the pain has been increasing in severity as he walks.  [place] [place] [place] [facility] for my evaluation and treatment of his painful feet.  He says he has received no treatment or evaluation for his painful feet he does admit that he believes he has flat feet both feet.  He presents the office today for an evaluation and treatment of his painful feet.  He also relates he needs to have his nails trimmed since he is unable to perform the task himself.  General Appearance  Alert, conversant and in no acute stress.  Vascular  Dorsalis pedis and posterior tibial  pulses are not  palpable  bilaterally.  Capillary return is within normal limits  bilaterally. Temperature is within normal limits  bilaterally.  Neurologic  Senn-Weinstein monofilament wire test diminished/absent bilaterally. Muscle power within normal limits bilaterally.  Nails Thick disfigured discolored nails with subungual debris  from hallux to fifth toes bilaterally. No evidence of bacterial infection or drainage bilaterally.  Orthopedic  No limitations of motion  feet .  No crepitus or effusions noted.  No bony pathology or digital deformities noted. Limited ROM STJ left foot.  Pes planus  B/l. Palpable pain along the course of the plantar fascia  B/L.  Skin  normotropic skin with no porokeratosis noted bilaterally.  No signs of infections or ulcers noted.  .  Plantar fasciitis secondary to pes planus.  Onychomycosis  B/L  IE.  X-ray of the right foot reveal arthritic changes at the IPJ right hallux.  Patient has a flattened calcaneal inclination angle.  X-rays taken of the left foot reveals arthritic changes noted at the talonavicular joint left foot.  Discussed this condition with this patient.  Told him that he has flat feet bilaterally and the left has limited rear foot motion due to  arthritis.  I therefore recommended he make an appointment and see Raiford NobleRick this coming Friday.  Helane GuntherGregory Massey Ruhland DPM

## 2018-05-13 ENCOUNTER — Ambulatory Visit: Payer: Medicare Other | Admitting: Orthotics

## 2018-05-13 DIAGNOSIS — M2142 Flat foot [pes planus] (acquired), left foot: Principal | ICD-10-CM

## 2018-05-13 DIAGNOSIS — M19071 Primary osteoarthritis, right ankle and foot: Secondary | ICD-10-CM

## 2018-05-13 DIAGNOSIS — M19072 Primary osteoarthritis, left ankle and foot: Secondary | ICD-10-CM

## 2018-05-13 DIAGNOSIS — M2141 Flat foot [pes planus] (acquired), right foot: Secondary | ICD-10-CM

## 2018-05-13 NOTE — Progress Notes (Signed)
Evaulation done today for custom bracing, OTS shoes per Dr. Stacie AcresMayer, DPM.  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    Patient has limited ROM sagittal plan, and also pes planus with talar shift medially.  Patient is pretty much in fixed/rigid state.  Therefore, best treatment option is [place] type of ankle gauntlet to reduce painfful movement, and [place] shoes to aid in ambulation in sagittal plan.   [facility] and giving scrpt for [facility] to provide DME itemes.

## 2018-05-23 ENCOUNTER — Telehealth: Payer: Self-pay | Admitting: Podiatry

## 2018-05-23 NOTE — Telephone Encounter (Signed)
[facility] and pt miss understood about getting braces and shoes at our office. I explained to her that the authorization that is sent to us is for office visit and or xray in our office only and that we are not able to dispense any dme from our office if the patient goes to the [facility]. They have a prosthetic department that handles this.   I explained to her with pt on the other line that he was given a prescription and needs to take it to the [place] [facility] prosthetic de[facility] for them to dispense or refer out. She said that pt told her that he thought we did not want to take care of him and I told her absolutely not that [facility] not [facility] to do any [facility] unlike [facility] [place] [facility] [place] [facility] [place].

## 2019-06-27 ENCOUNTER — Emergency Department (HOSPITAL_COMMUNITY)
Admission: EM | Admit: 2019-06-27 | Discharge: 2019-06-28 | Disposition: A | Discharge status: Home / Self Care | Payer: No Typology Code available for payment source | Attending: Emergency Medicine | Admitting: Emergency Medicine

## 2019-06-27 ENCOUNTER — Encounter (HOSPITAL_COMMUNITY): Payer: Self-pay

## 2019-06-27 DIAGNOSIS — I719 Aortic aneurysm of unspecified site, without rupture: Secondary | ICD-10-CM | POA: Diagnosis not present

## 2019-06-27 DIAGNOSIS — E119 Type 2 diabetes mellitus without complications: Secondary | ICD-10-CM | POA: Diagnosis not present

## 2019-06-27 DIAGNOSIS — Z794 Long term (current) use of insulin: Secondary | ICD-10-CM | POA: Insufficient documentation

## 2019-06-27 DIAGNOSIS — F17210 Nicotine dependence, cigarettes, uncomplicated: Secondary | ICD-10-CM | POA: Diagnosis not present

## 2019-06-27 DIAGNOSIS — Z79899 Other long term (current) drug therapy: Secondary | ICD-10-CM | POA: Insufficient documentation

## 2019-06-27 DIAGNOSIS — J449 Chronic obstructive pulmonary disease, unspecified: Secondary | ICD-10-CM | POA: Insufficient documentation

## 2019-06-27 DIAGNOSIS — I10 Essential (primary) hypertension: Secondary | ICD-10-CM | POA: Diagnosis not present

## 2019-06-27 DIAGNOSIS — R109 Unspecified abdominal pain: Secondary | ICD-10-CM | POA: Diagnosis present

## 2019-06-27 DIAGNOSIS — Z96653 Presence of artificial knee joint, bilateral: Secondary | ICD-10-CM | POA: Insufficient documentation

## 2019-06-27 DIAGNOSIS — N3001 Acute cystitis with hematuria: Secondary | ICD-10-CM | POA: Diagnosis not present

## 2019-06-27 NOTE — ED Triage Notes (Signed)
Pt states that he has been having L sided flank pain and hematuria since yesterday.

## 2019-06-28 ENCOUNTER — Encounter (HOSPITAL_COMMUNITY): Payer: Self-pay | Admitting: Radiology

## 2019-06-28 ENCOUNTER — Emergency Department (HOSPITAL_COMMUNITY): Payer: No Typology Code available for payment source

## 2019-06-28 LAB — CBC WITH DIFFERENTIAL/PLATELET
Abs Immature Granulocytes: 0.05 10*3/uL (ref 0.00–0.07)
Basophils Absolute: 0.1 10*3/uL (ref 0.0–0.1)
Basophils Relative: 1 %
Eosinophils Absolute: 0.2 10*3/uL (ref 0.0–0.5)
Eosinophils Relative: 2 %
HCT: 30.6 % — ABNORMAL LOW (ref 39.0–52.0)
Hemoglobin: 9.6 g/dL — ABNORMAL LOW (ref 13.0–17.0)
Immature Granulocytes: 0 %
Lymphocytes Relative: 17 %
Lymphs Abs: 2.4 10*3/uL (ref 0.7–4.0)
MCH: 23.9 pg — ABNORMAL LOW (ref 26.0–34.0)
MCHC: 31.4 g/dL (ref 30.0–36.0)
MCV: 76.3 fL — ABNORMAL LOW (ref 80.0–100.0)
Monocytes Absolute: 1.3 10*3/uL — ABNORMAL HIGH (ref 0.1–1.0)
Monocytes Relative: 10 %
Neutro Abs: 9.9 10*3/uL — ABNORMAL HIGH (ref 1.7–7.7)
Neutrophils Relative %: 70 %
Platelets: 322 10*3/uL (ref 150–400)
RBC: 4.01 MIL/uL — ABNORMAL LOW (ref 4.22–5.81)
RDW: 17 % — ABNORMAL HIGH (ref 11.5–15.5)
WBC: 14 10*3/uL — ABNORMAL HIGH (ref 4.0–10.5)
nRBC: 0 % (ref 0.0–0.2)

## 2019-06-28 LAB — BASIC METABOLIC PANEL
Anion gap: 13 (ref 5–15)
BUN: 19 mg/dL (ref 8–23)
CO2: 24 mmol/L (ref 22–32)
Calcium: 9.2 mg/dL (ref 8.9–10.3)
Chloride: 102 mmol/L (ref 98–111)
Creatinine, Ser: 1.55 mg/dL — ABNORMAL HIGH (ref 0.61–1.24)
GFR calc Af Amer: 51 mL/min — ABNORMAL LOW (ref >60)
GFR calc non Af Amer: 44 mL/min — ABNORMAL LOW (ref >60)
Glucose, Bld: 177 mg/dL — ABNORMAL HIGH (ref 70–99)
Potassium: 3.3 mmol/L — ABNORMAL LOW (ref 3.5–5.1)
Sodium: 139 mmol/L (ref 135–145)

## 2019-06-28 LAB — URINALYSIS, ROUTINE W REFLEX MICROSCOPIC
Bilirubin Urine: NEGATIVE
Glucose, UA: NEGATIVE mg/dL
Ketones, ur: NEGATIVE mg/dL
Nitrite: POSITIVE — AB
Protein, ur: 100 mg/dL — AB
RBC / HPF: >50 RBC/hpf — ABNORMAL HIGH (ref 0–5)
Specific Gravity, Urine: 1.017 (ref 1.005–1.030)
WBC, UA: >50 WBC/hpf — ABNORMAL HIGH (ref 0–5)
pH: 5 (ref 5.0–8.0)

## 2019-06-28 MED ORDER — FENTANYL CITRATE (PF) 100 MCG/2ML IJ SOLN
100.0000 ug | Freq: Once | INTRAMUSCULAR | Status: AC
  Administered 2019-06-28: 05:00:00 100 ug via INTRAVENOUS
  Filled 2019-06-28: qty 2

## 2019-06-28 MED ORDER — HYDROCODONE-ACETAMINOPHEN 5-325 MG PO TABS: 1.0000 | ORAL_TABLET | Freq: Four times a day (QID) | ORAL | 0 refills | Status: DC | PRN

## 2019-06-28 MED ORDER — SODIUM CHLORIDE 0.9 % IV SOLN
1.0000 g | Freq: Once | INTRAVENOUS | Status: AC
  Administered 2019-06-28: 1 g via INTRAVENOUS
  Filled 2019-06-28: qty 10

## 2019-06-28 MED ORDER — CEPHALEXIN 500 MG PO CAPS: 500.0000 mg | ORAL_CAPSULE | Freq: Two times a day (BID) | ORAL | 0 refills | Status: DC

## 2019-06-28 MED ORDER — FERROUS SULFATE 325 (65 FE) MG PO TABS: 325.0000 mg | ORAL_TABLET | Freq: Every day | ORAL | 0 refills | Status: DC

## 2019-06-28 NOTE — ED Notes (Signed)
This tech performed a bladder scan. Bladder scan recorded highest measurement was 167 mL.

## 2019-06-28 NOTE — Discharge Instructions (Addendum)
You will need followup ultrasound of your aorta in your abdomen in the next year - [facility]. [facility] also [facility] any anemia/low blood counts at your next visit in March You can take iron every day to help with the low blood counts

## 2019-06-28 NOTE — ED Provider Notes (Signed)
[facility] EMERGENCY DEPARTMENT Provider Note   CSN: [id number] Arrival date & time: 06/27/19  2205     History Chief Complaint  Patient presents with  . Flank Pain    [name] Parks is a 74 y.o. male.  The history is provided by the patient.  Flank Pain This is a new problem. The current episode started more than 1 week ago. The problem occurs daily. The problem has been gradually worsening. Associated symptoms include abdominal pain. Pertinent negatives include no chest pain. Associated symptoms comments: Chronic shortness of breath due to smoking. Nothing aggravates the symptoms. The symptoms are relieved by position.   Patient presents for flank pain and hematuria.  He reports he has had left flank pain for several weeks.  He reports over the past several days he been having dysuria and now having hematuria. No fevers or vomiting. He does report associated abdominal pain.    Past Medical History:  Diagnosis Date  . CAP (community acquired pneumonia) 10/17/2013  . COPD (chronic obstructive pulmonary disease) (HCC)   . Hyperlipidemia   . Hypertension   . Migraines    "long time ago; none lately" (10/17/2013)  . OSA on CPAP   . PTSD (post-traumatic stress disorder)   . Shortness of breath dyspnea   . Type II diabetes mellitus [facility])     Patient Active Problem List   Diagnosis Date Noted  . Acute kidney injury (HCC) 06/13/2017  . Lower urinary tract infectious disease 06/12/2017  . Abnormal nuclear stress test 09/21/2014  . Near syncope   . Atypical chest pain   . Diarrhea 08/29/2014  . Tobacco abuse 08/29/2014  . Elevated troponin 08/29/2014  . Dehydration 08/29/2014  . COPD (chronic obstructive pulmonary disease) (HCC) 08/29/2014  . Syncope 08/29/2014  . Diabetes mellitus without complication (HCC)   . SOB (shortness of breath)   . Dyspnea 05/07/2014  . HCAP (healthcare-associated pneumonia) 10/17/2013  . COPD exacerbation (HCC) 10/17/2013   . Leukocytosis 10/17/2013  . Nicotine abuse 10/17/2013  . Diabetes mellitus (HCC) 10/17/2013  . Hypertension 10/17/2013  . Hyperlipidemia 10/17/2013    Past Surgical History:  Procedure Laterality Date  . EXCISIONAL HEMORRHOIDECTOMY  1970's?  Marland Kitchen JOINT REPLACEMENT    . LEFT HEART CATHETERIZATION WITH CORONARY ANGIOGRAM N/A 09/21/2014   Procedure: LEFT HEART CATHETERIZATION WITH CORONARY ANGIOGRAM;  Surgeon: Peter M Swaziland, MD;  Location: [facility] CATH LAB;  Service: Cardiovascular;  Laterality: N/A;  . TOTAL KNEE ARTHROPLASTY Right ~ 2012  . TOTAL KNEE ARTHROPLASTY Left ~ 2013       Family History  Problem Relation Age of Onset  . Diabetes Mother   . Heart attack Father   . Diabetes Sister     Social History   Tobacco Use  . Smoking status: Current Every Day Smoker    Packs/day: 1.00    Years: 50.00    Pack years: 50.00    Types: Cigarettes  . Smokeless tobacco: Never Used  Substance Use Topics  . Alcohol use: No    Comment: "used to drink a long time ago; none since the 1970's"  . Drug use: No    Home Medications Prior to Admission medications   Medication Sig Start Date End Date Taking? Authorizing Provider  acetaminophen (TYLENOL) 325 MG tablet Take 650 mg by mouth 3 (three) times daily as needed for mild pain.    [provider]  albuterol (PROVENTIL HFA;VENTOLIN HFA) 108 (90 BASE) MCG/ACT inhaler Inhale 2 puffs into the lungs  every 4 (four) hours as needed for wheezing or shortness of breath. 05/08/14   Ardith Dark, MD  alfuzosin (UROXATRAL) 10 MG 24 hr tablet Take 10 mg by mouth daily with breakfast.    [provider]  atorvastatin (LIPITOR) 80 MG tablet Take 40 mg by mouth at bedtime.     [provider]  chlorthalidone (HYGROTON) 25 MG tablet Take 12.5 mg by mouth daily.    [provider]  cholecalciferol (VITAMIN D) 1000 UNITS tablet Take 1,000 Units by mouth daily.    [provider]  ferrous sulfate 325 (65 FE)  MG tablet Take 325 mg by mouth 2 (two) times daily with a meal.    [provider]  fluticasone (FLONASE) 50 MCG/ACT nasal spray Place 2 sprays into both nostrils daily as needed for allergies.     [provider]  insulin aspart (NOVOLOG) 100 UNIT/ML injection Inject 6 Units into the skin 3 (three) times daily with meals. Patient taking differently: Inject 4 Units into the skin 3 (three) times daily with meals.  10/20/13   Dellinger, Tora Kindred, PA-C  insulin glargine (LANTUS) 100 UNIT/ML injection Inject 0.25 mLs (25 Units total) into the skin at bedtime. 10/20/13   Dellinger, Clerance Lav L, PA-C  LORATADINE PO Take by mouth.    [provider]  metFORMIN (GLUCOPHAGE) 500 MG tablet Take 500 mg by mouth 2 (two) times daily with a meal.     [provider]  metoprolol tartrate (LOPRESSOR) 25 MG tablet Take 25 mg by mouth 2 (two) times daily.     [provider]  Multiple Vitamin (MULTIVITAMIN) tablet Take 1 tablet by mouth daily. Centrum Silver    [provider]  omeprazole (PRILOSEC) 20 MG capsule Take 20 mg by mouth 2 (two) times daily.    [provider]  PARoxetine (PAXIL) 40 MG tablet Take 40 mg by mouth daily.    [provider]  potassium chloride SA (K-DUR,KLOR-CON) 20 MEQ tablet Take 20 mEq by mouth daily.    [provider]  senna-docusate (SENOKOT-S) 8.6-50 MG per tablet Take 2 tablets by mouth at bedtime.    [provider]  tamsulosin (FLOMAX) 0.4 MG CAPS capsule Take 0.4 mg by mouth 2 (two) times daily.    [provider]  Tiotropium Bromide-Olodaterol 2.5-2.5 MCG/ACT AERS Inhale 2 puffs into the lungs daily.    [provider]    Allergies    Paxil [paroxetine hcl], Trazodone and nefazodone, and Wellbutrin [bupropion]  Review of Systems   Review of Systems  Cardiovascular: Negative for chest pain.  Gastrointestinal: Positive for abdominal pain.  Genitourinary: Positive for  dysuria, flank pain and hematuria.  All other systems reviewed and are negative.   Physical Exam Updated Vital Signs BP (!) 112/58 (BP Location: Right Arm)   Pulse (!) 114   Temp 99.1 F (37.3 C) (Oral)   Resp 18   SpO2 100%   Physical Exam CONSTITUTIONAL: Elderly, no acute distress HEAD: Normocephalic/atraumatic EYES: EOMI/PERRL ENMT: Mucous membranes moist NECK: supple no meningeal signs SPINE/BACK:entire spine nontender CV: S1/S2 noted, no murmurs/rubs/gallops noted LUNGS: Lungs are clear to auscultation bilaterally, no apparent distress ABDOMEN: soft, moderate left upper and left lower quadrant tenderness, no rebound or guarding, bowel sounds noted throughout abdomen GU:no cva tenderness, no inguinal hernia, no testicular tenderness, no erythema/edema, chaperone present NEURO: Pt is awake/alert/appropriate, moves all extremitiesx4.  No facial droop.   EXTREMITIES: pulses normal/equal, full ROM SKIN: warm, color  normal PSYCH: no abnormalities of mood noted, alert and oriented to situation  ED Results / Procedures / Treatments   Labs (all labs ordered are listed, but only abnormal results are displayed) Labs Reviewed  URINALYSIS, ROUTINE W REFLEX MICROSCOPIC - Abnormal; Notable for the following components:      Result Value   APPearance CLOUDY (*)    Hgb urine dipstick MODERATE (*)    Protein, ur 100 (*)    Nitrite POSITIVE (*)    Leukocytes,Ua MODERATE (*)    RBC / HPF >50 (*)    WBC, UA >50 (*)    Bacteria, UA FEW (*)    All other components within normal limits  BASIC METABOLIC PANEL - Abnormal; Notable for the following components:   Potassium 3.3 (*)    Glucose, Bld 177 (*)    Creatinine, Ser 1.55 (*)    GFR calc non Af Amer 44 (*)    GFR calc Af Amer 51 (*)    All other components within normal limits  CBC WITH DIFFERENTIAL/PLATELET - Abnormal; Notable for the following components:   WBC 14.0 (*)    RBC 4.01 (*)    Hemoglobin 9.6 (*)    HCT 30.6 (*)     MCV 76.3 (*)    MCH 23.9 (*)    RDW 17.0 (*)    Neutro Abs 9.9 (*)    Monocytes Absolute 1.3 (*)    All other components within normal limits  URINE CULTURE    EKG None  Radiology CT Renal Stone Study  Result Date: 06/28/2019 CLINICAL DATA:  Flank pain. Kidney stone suspected. Hematuria with unknown cause. EXAM: CT ABDOMEN AND PELVIS WITHOUT CONTRAST TECHNIQUE: Multidetector CT imaging of the abdomen and pelvis was performed following the standard protocol without IV contrast. COMPARISON:  08/28/2014 FINDINGS: Lower chest:  Coronary calcification.  Emphysema.  No acute finding. Hepatobiliary: Subcapsular low-density in the upper left liver, essentially stable from before when it had a cystic appearance on postcontrast imaging.Incomplete distention of the gallbladder. No calcified stone or inflammatory changes. Pancreas: Unremarkable. Spleen: Unremarkable. 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 Simple appearing left renal cortical cysts. No hydronephrosis or ureteral stone. 2 mm left upper pole calculus. Prominent bladder wall thickness with small cellule towards the left. Stomach/Bowel:  No obstruction. No appendicitis. Vascular/Lymphatic: No acute vascular abnormality. Extensive atherosclerotic calcification. There is fusiform aneurysmal enlargement of the infrarenal aorta up to 3 cm. Both iliacs are also enlarged, measuring up to 2 cm in diameter on the right. No mass or adenopathy. Reproductive:No pathologic findings. Other: No ascites or pneumoperitoneum. Musculoskeletal: No acute abnormalities. Degenerative disease and mild scoliosis. IMPRESSION: 1. No acute finding. 2. Bladder wall thickening possibly from chronic outlet obstruction-a small cellule is seen on the left. 3. Small left upper pole calculus. 4. Aortic Atherosclerosis (ICD10-I70.0) and Emphysema (ICD10-J43.9). 5. Aortic aneurysm NOS (ICD10-I71.9). Recommend followup by ultrasound in 3  years. This recommendation follows ACR consensus guidelines: White Paper of the ACR Incidental Findings Committee II on Vascular Findings. [facility] Radiol 2013; 10:789-794 Electronically Signed   By: Marnee Spring M.D.   On: 06/28/2019 05:36    Procedures Procedures   Medications Ordered in ED Medications  cefTRIAXone (ROCEPHIN) 1 g in sodium chloride 0.9 % 100 mL IVPB (1 g Intravenous New Bag/Given 06/28/19 0552)  fentaNYL (SUBLIMAZE) injection 100 mcg (100 mcg Intravenous Given 06/28/19 0445)    ED Course  I have reviewed the triage vital signs and  the nursing notes.  Pertinent labs & imaging results that were available during my care of the patient were reviewed by me and considered in my medical decision making (see chart for details).    MDM Rules/Calculators/A&P                      4:45 AM Patient with recent left flank pain now with dysuria and hematuria.  Bladder scan showed volume of 167 mL. Will likely need CT imaging, awaiting urinalysis 6:02 AM Patient feels improved.  CT negative for kidney stone.  He has evidence of UTI. Overall well-appearing, he is not septic appearing. He will be appropriate for outpatient management for UTI. Incidental finding of AAA.  This is discussed with patient and his wife via phone.  He will need ultrasound this year by [facility] Also noted to be anemic, but unclear acuity.  No recent history of GI bleed  wife reports he has been on iron before and is requesting a new prescription.  [facility] Final Clinical Impression(s) / ED Diagnoses Final diagnoses:  Acute cystitis with hematuria  Aortic aneurysm without rupture, unspecified portion of aorta ([place])    Rx / DC Orders ED Discharge Orders         Ordered    HYDROcodone-acetaminophen (NORCO/VICODIN) 5-325 MG tablet  Every 6 hours PRN     06/28/19 0600    cephALEXin (KEFLEX) 500 MG capsule  2 times daily     06/28/19 0600     ferrous sulfate 325 (65 FE) MG tablet  Daily     06/28/19 0600           Ripley Fraise, MD 06/28/19 [phone]

## 2019-06-30 LAB — URINE CULTURE: Culture: >=100000 — AB

## 2019-07-24 ENCOUNTER — Ambulatory Visit: Payer: No Typology Code available for payment source | Attending: Internal Medicine

## 2019-07-24 DIAGNOSIS — Z23 Encounter for immunization: Secondary | ICD-10-CM | POA: Insufficient documentation

## 2019-07-24 NOTE — Progress Notes (Signed)
   Covid-19 Vaccination Clinic  Name:  [name] Parks    MRN: [id number] DOB: Nov 22, 1945  07/24/2019  [name] Parks was observed post Covid-19 immunization for 15 minutes without incidence. He was provided with Vaccine Information Sheet and instruction to access the V-Safe system.   [name] Parks was instructed to call 911 with any severe reactions post vaccine: Marland Kitchen Difficulty breathing  . Swelling of your face and throat  . A fast heartbeat  . A bad rash all over your body  . Dizziness and weakness    Immunizations Administered    Name Date Dose VIS Date Route   Pfizer COVID-19 Vaccine 07/24/2019 10:22 AM 0.3 mL 05/05/2019 Intramuscular   Manufacturer: [employer], [employer]   Lot: [id number]   NDC: [address]-2

## 2019-08-16 ENCOUNTER — Ambulatory Visit: Payer: No Typology Code available for payment source | Attending: Internal Medicine

## 2019-08-16 DIAGNOSIS — Z23 Encounter for immunization: Secondary | ICD-10-CM

## 2019-08-16 NOTE — Progress Notes (Signed)
   Covid-19 Vaccination Clinic  Name:  [name] Parks    MRN: [id number] DOB: September 19, 1945  08/16/2019  [name] Parks was observed post Covid-19 immunization for 15 minutes without incident. He was provided with Vaccine Information Sheet and instruction to access the V-Safe system.   [name] Parks was instructed to call 911 with any severe reactions post vaccine: Marland Kitchen Difficulty breathing  . Swelling of face and throat  . A fast heartbeat  . A bad rash all over body  . Dizziness and weakness   Immunizations Administered    Name Date Dose VIS Date Route   Pfizer COVID-19 Vaccine 08/16/2019 10:17 AM 0.3 mL 05/05/2019 Intramuscular   Manufacturer: [employer], [employer]   Lot: [id number]   NDC: [address]-5

## 2020-04-24 ENCOUNTER — Inpatient Hospital Stay (HOSPITAL_COMMUNITY)
Admission: EM | Admit: 2020-04-24 | Discharge: 2020-04-27 | DRG: 871 | Disposition: A | Discharge status: Home Health | Payer: No Typology Code available for payment source | Attending: Internal Medicine | Admitting: Internal Medicine

## 2020-04-24 ENCOUNTER — Emergency Department (HOSPITAL_COMMUNITY): Payer: No Typology Code available for payment source

## 2020-04-24 ENCOUNTER — Other Ambulatory Visit: Payer: Self-pay

## 2020-04-24 ENCOUNTER — Encounter (HOSPITAL_COMMUNITY): Payer: Self-pay | Admitting: Internal Medicine

## 2020-04-24 DIAGNOSIS — R4182 Altered mental status, unspecified: Secondary | ICD-10-CM | POA: Diagnosis present

## 2020-04-24 DIAGNOSIS — Z888 Allergy status to other drugs, medicaments and biological substances status: Secondary | ICD-10-CM

## 2020-04-24 DIAGNOSIS — I10 Essential (primary) hypertension: Secondary | ICD-10-CM | POA: Diagnosis not present

## 2020-04-24 DIAGNOSIS — F17210 Nicotine dependence, cigarettes, uncomplicated: Secondary | ICD-10-CM | POA: Diagnosis not present

## 2020-04-24 DIAGNOSIS — Z23 Encounter for immunization: Secondary | ICD-10-CM | POA: Diagnosis not present

## 2020-04-24 DIAGNOSIS — Z96653 Presence of artificial knee joint, bilateral: Secondary | ICD-10-CM | POA: Diagnosis present

## 2020-04-24 DIAGNOSIS — E119 Type 2 diabetes mellitus without complications: Secondary | ICD-10-CM | POA: Diagnosis not present

## 2020-04-24 DIAGNOSIS — N3001 Acute cystitis with hematuria: Secondary | ICD-10-CM | POA: Diagnosis not present

## 2020-04-24 DIAGNOSIS — J439 Emphysema, unspecified: Secondary | ICD-10-CM | POA: Diagnosis present

## 2020-04-24 DIAGNOSIS — E785 Hyperlipidemia, unspecified: Secondary | ICD-10-CM | POA: Diagnosis present

## 2020-04-24 DIAGNOSIS — N390 Urinary tract infection, site not specified: Secondary | ICD-10-CM | POA: Diagnosis not present

## 2020-04-24 DIAGNOSIS — J449 Chronic obstructive pulmonary disease, unspecified: Secondary | ICD-10-CM | POA: Diagnosis present

## 2020-04-24 DIAGNOSIS — Z885 Allergy status to narcotic agent status: Secondary | ICD-10-CM | POA: Diagnosis not present

## 2020-04-24 DIAGNOSIS — Z720 Tobacco use: Secondary | ICD-10-CM | POA: Diagnosis present

## 2020-04-24 DIAGNOSIS — R6510 Systemic inflammatory response syndrome (SIRS) of non-infectious origin without acute organ dysfunction: Secondary | ICD-10-CM | POA: Insufficient documentation

## 2020-04-24 DIAGNOSIS — E876 Hypokalemia: Secondary | ICD-10-CM | POA: Diagnosis not present

## 2020-04-24 DIAGNOSIS — Z794 Long term (current) use of insulin: Secondary | ICD-10-CM

## 2020-04-24 DIAGNOSIS — Z833 Family history of diabetes mellitus: Secondary | ICD-10-CM | POA: Diagnosis not present

## 2020-04-24 DIAGNOSIS — Z20822 Contact with and (suspected) exposure to covid-19: Secondary | ICD-10-CM | POA: Diagnosis not present

## 2020-04-24 DIAGNOSIS — J690 Pneumonitis due to inhalation of food and vomit: Secondary | ICD-10-CM | POA: Diagnosis not present

## 2020-04-24 DIAGNOSIS — Z8249 Family history of ischemic heart disease and other diseases of the circulatory system: Secondary | ICD-10-CM

## 2020-04-24 DIAGNOSIS — G4733 Obstructive sleep apnea (adult) (pediatric): Secondary | ICD-10-CM | POA: Diagnosis present

## 2020-04-24 DIAGNOSIS — Z79899 Other long term (current) drug therapy: Secondary | ICD-10-CM

## 2020-04-24 DIAGNOSIS — A419 Sepsis, unspecified organism: Secondary | ICD-10-CM | POA: Diagnosis present

## 2020-04-24 DIAGNOSIS — G9341 Metabolic encephalopathy: Secondary | ICD-10-CM | POA: Diagnosis not present

## 2020-04-24 DIAGNOSIS — J189 Pneumonia, unspecified organism: Secondary | ICD-10-CM | POA: Diagnosis present

## 2020-04-24 DIAGNOSIS — R410 Disorientation, unspecified: Secondary | ICD-10-CM | POA: Diagnosis not present

## 2020-04-24 DIAGNOSIS — N400 Enlarged prostate without lower urinary tract symptoms: Secondary | ICD-10-CM | POA: Diagnosis not present

## 2020-04-24 DIAGNOSIS — F4310 Post-traumatic stress disorder, unspecified: Secondary | ICD-10-CM | POA: Diagnosis not present

## 2020-04-24 DIAGNOSIS — D72829 Elevated white blood cell count, unspecified: Secondary | ICD-10-CM | POA: Diagnosis present

## 2020-04-24 LAB — PROTIME-INR
INR: 1.2 (ref 0.8–1.2)
Prothrombin Time: 14.8 seconds (ref 11.4–15.2)

## 2020-04-24 LAB — CBC WITH DIFFERENTIAL/PLATELET
Abs Immature Granulocytes: 0.13 10*3/uL — ABNORMAL HIGH (ref 0.00–0.07)
Basophils Absolute: 0.1 10*3/uL (ref 0.0–0.1)
Basophils Relative: 0 %
Eosinophils Absolute: 0 10*3/uL (ref 0.0–0.5)
Eosinophils Relative: 0 %
HCT: 37.5 % — ABNORMAL LOW (ref 39.0–52.0)
Hemoglobin: 12.9 g/dL — ABNORMAL LOW (ref 13.0–17.0)
Immature Granulocytes: 1 %
Lymphocytes Relative: 8 %
Lymphs Abs: 2 10*3/uL (ref 0.7–4.0)
MCH: 30.6 pg (ref 26.0–34.0)
MCHC: 34.4 g/dL (ref 30.0–36.0)
MCV: 88.9 fL (ref 80.0–100.0)
Monocytes Absolute: 1.9 10*3/uL — ABNORMAL HIGH (ref 0.1–1.0)
Monocytes Relative: 8 %
Neutro Abs: 20.4 10*3/uL — ABNORMAL HIGH (ref 1.7–7.7)
Neutrophils Relative %: 83 %
Platelets: 256 10*3/uL (ref 150–400)
RBC: 4.22 MIL/uL (ref 4.22–5.81)
RDW: 14.4 % (ref 11.5–15.5)
WBC: 24.5 10*3/uL — ABNORMAL HIGH (ref 4.0–10.5)
nRBC: 0 % (ref 0.0–0.2)

## 2020-04-24 LAB — URINALYSIS, ROUTINE W REFLEX MICROSCOPIC
Bilirubin Urine: NEGATIVE
Glucose, UA: NEGATIVE mg/dL
Ketones, ur: NEGATIVE mg/dL
Nitrite: NEGATIVE
Protein, ur: 30 mg/dL — AB
Specific Gravity, Urine: 1.004 — ABNORMAL LOW (ref 1.005–1.030)
pH: 6 (ref 5.0–8.0)

## 2020-04-24 LAB — APTT: aPTT: 38 seconds — ABNORMAL HIGH (ref 24–36)

## 2020-04-24 LAB — COMPREHENSIVE METABOLIC PANEL
ALT: 12 U/L (ref 0–44)
AST: 14 U/L — ABNORMAL LOW (ref 15–41)
Albumin: 3.2 g/dL — ABNORMAL LOW (ref 3.5–5.0)
Alkaline Phosphatase: 97 U/L (ref 38–126)
Anion gap: 14 (ref 5–15)
BUN: 13 mg/dL (ref 8–23)
CO2: 22 mmol/L (ref 22–32)
Calcium: 9.2 mg/dL (ref 8.9–10.3)
Chloride: 101 mmol/L (ref 98–111)
Creatinine, Ser: 1.32 mg/dL — ABNORMAL HIGH (ref 0.61–1.24)
GFR, Estimated: 57 mL/min — ABNORMAL LOW (ref >60)
Glucose, Bld: 180 mg/dL — ABNORMAL HIGH (ref 70–99)
Potassium: 3.6 mmol/L (ref 3.5–5.1)
Sodium: 137 mmol/L (ref 135–145)
Total Bilirubin: 1.2 mg/dL (ref 0.3–1.2)
Total Protein: 7 g/dL (ref 6.5–8.1)

## 2020-04-24 LAB — HEMOGLOBIN A1C
Hgb A1c MFr Bld: 6.7 % — ABNORMAL HIGH (ref 4.8–5.6)
Mean Plasma Glucose: 145.59 mg/dL

## 2020-04-24 LAB — CK: Total CK: 106 U/L (ref 49–397)

## 2020-04-24 LAB — GLUCOSE, CAPILLARY
Glucose-Capillary: 178 mg/dL — ABNORMAL HIGH (ref 70–99)
Glucose-Capillary: 231 mg/dL — ABNORMAL HIGH (ref 70–99)

## 2020-04-24 LAB — LACTIC ACID, PLASMA
Lactic Acid, Venous: 1.6 mmol/L (ref 0.5–1.9)
Lactic Acid, Venous: 2 mmol/L (ref 0.5–1.9)

## 2020-04-24 LAB — RESP PANEL BY RT-PCR (FLU A&B, COVID) ARPGX2
Influenza A by PCR: NEGATIVE
Influenza B by PCR: NEGATIVE
SARS Coronavirus 2 by RT PCR: NEGATIVE

## 2020-04-24 LAB — PROCALCITONIN: Procalcitonin: 0.55 ng/mL

## 2020-04-24 MED ORDER — ACETAMINOPHEN 325 MG PO TABS
650.0000 mg | ORAL_TABLET | Freq: Four times a day (QID) | ORAL | Status: DC | PRN
  Administered 2020-04-25 – 2020-04-27 (×3): 650 mg via ORAL
  Filled 2020-04-24 (×4): qty 2

## 2020-04-24 MED ORDER — ATORVASTATIN CALCIUM 40 MG PO TABS
40.0000 mg | ORAL_TABLET | Freq: Every day | ORAL | Status: DC
  Administered 2020-04-24 – 2020-04-26 (×3): 40 mg via ORAL
  Filled 2020-04-24 (×3): qty 1

## 2020-04-24 MED ORDER — SENNOSIDES-DOCUSATE SODIUM 8.6-50 MG PO TABS
2.0000 | ORAL_TABLET | Freq: Every day | ORAL | Status: DC
  Administered 2020-04-24 – 2020-04-26 (×2): 2 via ORAL
  Filled 2020-04-24 (×3): qty 2

## 2020-04-24 MED ORDER — LABETALOL HCL 5 MG/ML IV SOLN
10.0000 mg | Freq: Once | INTRAVENOUS | Status: AC
  Administered 2020-04-24: 10 mg via INTRAVENOUS
  Filled 2020-04-24: qty 4

## 2020-04-24 MED ORDER — HYDROCODONE-ACETAMINOPHEN 5-325 MG PO TABS
1.0000 | ORAL_TABLET | ORAL | Status: DC | PRN
  Administered 2020-04-25 (×2): 1 via ORAL
  Filled 2020-04-24 (×2): qty 1

## 2020-04-24 MED ORDER — METOPROLOL TARTRATE 25 MG PO TABS
25.0000 mg | ORAL_TABLET | Freq: Two times a day (BID) | ORAL | Status: DC
  Administered 2020-04-24 – 2020-04-26 (×4): 25 mg via ORAL
  Filled 2020-04-24 (×4): qty 1

## 2020-04-24 MED ORDER — INSULIN GLARGINE 100 UNIT/ML ~~LOC~~ SOLN
20.0000 [IU] | Freq: Every day | SUBCUTANEOUS | Status: DC
  Administered 2020-04-24 – 2020-04-26 (×3): 20 [IU] via SUBCUTANEOUS
  Filled 2020-04-24 (×4): qty 0.2

## 2020-04-24 MED ORDER — ALFUZOSIN HCL ER 10 MG PO TB24
10.0000 mg | ORAL_TABLET | Freq: Every day | ORAL | Status: DC
  Administered 2020-04-25 – 2020-04-27 (×3): 10 mg via ORAL
  Filled 2020-04-24 (×4): qty 1

## 2020-04-24 MED ORDER — IPRATROPIUM-ALBUTEROL 0.5-2.5 (3) MG/3ML IN SOLN
3.0000 mL | RESPIRATORY_TRACT | Status: DC | PRN
  Administered 2020-04-24: 3 mL via RESPIRATORY_TRACT
  Filled 2020-04-24: qty 3

## 2020-04-24 MED ORDER — LACTATED RINGERS IV BOLUS (SEPSIS)
1000.0000 mL | Freq: Once | INTRAVENOUS | Status: AC
  Administered 2020-04-24: 1000 mL via INTRAVENOUS

## 2020-04-24 MED ORDER — FLUTICASONE PROPIONATE 50 MCG/ACT NA SUSP
2.0000 | Freq: Every day | NASAL | Status: DC | PRN
  Filled 2020-04-24: qty 16

## 2020-04-24 MED ORDER — SODIUM CHLORIDE 0.9 % IV SOLN
1.0000 g | INTRAVENOUS | Status: DC
  Administered 2020-04-25 – 2020-04-27 (×3): 1 g via INTRAVENOUS
  Filled 2020-04-24 (×2): qty 1
  Filled 2020-04-24 (×3): qty 10

## 2020-04-24 MED ORDER — ARFORMOTEROL TARTRATE 15 MCG/2ML IN NEBU
15.0000 ug | INHALATION_SOLUTION | Freq: Two times a day (BID) | RESPIRATORY_TRACT | Status: DC
  Administered 2020-04-24 – 2020-04-27 (×6): 15 ug via RESPIRATORY_TRACT
  Filled 2020-04-24 (×6): qty 2

## 2020-04-24 MED ORDER — PNEUMOCOCCAL VAC POLYVALENT 25 MCG/0.5ML IJ INJ
0.5000 mL | INJECTION | INTRAMUSCULAR | Status: AC
  Administered 2020-04-24: 0.5 mL via INTRAMUSCULAR
  Filled 2020-04-24: qty 0.5

## 2020-04-24 MED ORDER — ACETAMINOPHEN 650 MG RE SUPP: 650.0000 mg | Freq: Four times a day (QID) | RECTAL | Status: DC | PRN

## 2020-04-24 MED ORDER — SODIUM CHLORIDE 0.9 % IV SOLN
500.0000 mg | INTRAVENOUS | Status: DC
  Administered 2020-04-24 – 2020-04-25 (×2): 500 mg via INTRAVENOUS
  Filled 2020-04-24 (×2): qty 500

## 2020-04-24 MED ORDER — NICOTINE 14 MG/24HR TD PT24
14.0000 mg | MEDICATED_PATCH | Freq: Every day | TRANSDERMAL | Status: DC
  Administered 2020-04-24 – 2020-04-27 (×4): 14 mg via TRANSDERMAL
  Filled 2020-04-24 (×4): qty 1

## 2020-04-24 MED ORDER — VITAMIN D 25 MCG (1000 UNIT) PO TABS
1000.0000 [IU] | ORAL_TABLET | Freq: Every day | ORAL | Status: DC
  Administered 2020-04-24 – 2020-04-27 (×4): 1000 [IU] via ORAL
  Filled 2020-04-24 (×4): qty 1

## 2020-04-24 MED ORDER — ACETAMINOPHEN 325 MG PO TABS
650.0000 mg | ORAL_TABLET | Freq: Once | ORAL | Status: AC
  Administered 2020-04-24: 650 mg via ORAL
  Filled 2020-04-24: qty 2

## 2020-04-24 MED ORDER — HEPARIN SODIUM (PORCINE) 5000 UNIT/ML IJ SOLN
5000.0000 [IU] | Freq: Three times a day (TID) | INTRAMUSCULAR | Status: DC
  Administered 2020-04-24 – 2020-04-27 (×8): 5000 [IU] via SUBCUTANEOUS
  Filled 2020-04-24 (×8): qty 1

## 2020-04-24 MED ORDER — CHLORTHALIDONE 25 MG PO TABS
12.5000 mg | ORAL_TABLET | Freq: Every day | ORAL | Status: DC
  Administered 2020-04-24: 12.5 mg via ORAL
  Filled 2020-04-24 (×2): qty 0.5

## 2020-04-24 MED ORDER — ADULT MULTIVITAMIN W/MINERALS CH
1.0000 | ORAL_TABLET | Freq: Every day | ORAL | Status: DC
  Administered 2020-04-24 – 2020-04-27 (×4): 1 via ORAL
  Filled 2020-04-24 (×4): qty 1

## 2020-04-24 MED ORDER — INSULIN ASPART 100 UNIT/ML ~~LOC~~ SOLN
0.0000 [IU] | Freq: Three times a day (TID) | SUBCUTANEOUS | Status: DC
  Administered 2020-04-24: 2 [IU] via SUBCUTANEOUS
  Administered 2020-04-25 (×2): 1 [IU] via SUBCUTANEOUS
  Administered 2020-04-25: 2 [IU] via SUBCUTANEOUS

## 2020-04-24 MED ORDER — LACTATED RINGERS IV SOLN: INTRAVENOUS | Status: AC

## 2020-04-24 MED ORDER — INSULIN ASPART 100 UNIT/ML ~~LOC~~ SOLN
0.0000 [IU] | Freq: Every day | SUBCUTANEOUS | Status: DC
  Administered 2020-04-24: 2 [IU] via SUBCUTANEOUS

## 2020-04-24 MED ORDER — TAMSULOSIN HCL 0.4 MG PO CAPS
0.4000 mg | ORAL_CAPSULE | Freq: Two times a day (BID) | ORAL | Status: DC
  Administered 2020-04-24 – 2020-04-27 (×6): 0.4 mg via ORAL
  Filled 2020-04-24 (×6): qty 1

## 2020-04-24 MED ORDER — SODIUM CHLORIDE 0.9 % IV SOLN
2.0000 g | INTRAVENOUS | Status: DC
  Administered 2020-04-24: 2 g via INTRAVENOUS
  Filled 2020-04-24: qty 20

## 2020-04-24 NOTE — Evaluation (Signed)
Clinical/Bedside Swallow Evaluation Patient Details  Name: [name] Parks MRN: [id number] Date of Birth: [birth year]  Today's Date: 04/24/2020 Time: SLP Start Time (ACUTE ONLY): 1884 SLP Stop Time (ACUTE ONLY): 1635 SLP Time Calculation (min) (ACUTE ONLY): 11 min  Past Medical History:  Past Medical History:  Diagnosis Date  . CAP (community acquired pneumonia) 10/17/2013  . COPD (chronic obstructive pulmonary disease) ([place])   . Hyperlipidemia   . Hypertension   . Migraines    "long time ago; none lately" (10/17/2013)  . OSA on CPAP   . PTSD (post-traumatic stress disorder)   . Shortness of breath dyspnea   . Type II diabetes mellitus ([place])    Past Surgical History:  Past Surgical History:  Procedure Laterality Date  . EXCISIONAL HEMORRHOIDECTOMY  1970's?  Marland Kitchen JOINT REPLACEMENT    . LEFT HEART CATHETERIZATION WITH CORONARY ANGIOGRAM N/A 09/21/2014   Procedure: LEFT HEART CATHETERIZATION WITH CORONARY ANGIOGRAM;  Surgeon: Peter M Martinique, MD;  Location: [facility] CATH LAB;  Service: Cardiovascular;  Laterality: N/A;  . TOTAL KNEE ARTHROPLASTY Right ~ 2012  . TOTAL KNEE ARTHROPLASTY Left ~ 2013   HPI:  Pt is a 74 year old male with history of COPD, hypertension, hyperlipidemia, diabetes mellitus type 2 initially was brought to the ER via EMS as family noted the patient to be confused for the last 12 hours prior to admission. Pt was febrile at time of admission and met sepsis criteria at the time of admission with fevers, tachypnea, tachycardia, leukocytosis; source secondary to UTI but aspiration pneumonia was also considere by Dr. Tana Coast since pt mentioned that he has occasionally felt difficulty eating. CXR 12/1: Progressive interstitial changes likely related to the patient's known underlying emphysema. No radiographic evidence of acute cardiopulmonary disease. BSE 10/18/13: normal swallow function   Assessment / Plan / Recommendation Clinical Impression  Pt was seen for bedside swallow  evaluation. He reported coughing and globus sensation for 2-3 yrs with progression of symptoms. He denied any variability of symptoms with consistencies or any alleviating factors. Oral mechanism exam was [facility] and he presented with full dentures. He exhibited coughing with ice chips and delayed throat clearing was inconsistently observed with thin liquids via straw. No s/sx of aspiration were noted with any other solids or liquids. Mastication was WNL and no significant oral residue was noted. It is recommended that the current diet be continued, but a modified barium swallow study is recommended to further assess swallow function.  SLP Visit Diagnosis: Dysphagia, unspecified (R13.10)    Aspiration Risk       Diet Recommendation Regular;Thin liquid   Liquid Administration via: Cup;Straw Medication Administration: Whole meds with liquid Supervision: Patient able to self feed Compensations: Slow rate;Small sips/bites Postural Changes: Seated upright at 90 degrees    Other  Recommendations Oral Care Recommendations: Oral care BID   Follow up Recommendations  (TBD)      Frequency and Duration min 2x/week  2 weeks       Prognosis        Swallow Study   General Date of Onset: 04/23/20 HPI: Pt is a 74 year old male with history of COPD, hypertension, hyperlipidemia, diabetes mellitus type 2 initially was brought to the ER via EMS as family noted the patient to be confused for the last 12 hours prior to admission. Pt was febrile at time of admission and met sepsis criteria at the time of admission with fevers, tachypnea, tachycardia, leukocytosis; source secondary to UTI but aspiration pneumonia was also considere by Dr. Tana Coast  since pt mentioned that he has occasionally felt difficulty eating. CXR 12/1: Progressive interstitial changes likely related to the patient's known underlying emphysema. No radiographic evidence of acute cardiopulmonary disease. BSE 10/18/13: normal swallow function Type of  Study: Bedside Swallow Evaluation Previous Swallow Assessment: See HPI Diet Prior to this Study: Regular;Thin liquids Temperature Spikes Noted: No Respiratory Status: Room air History of Recent Intubation: No Behavior/Cognition: Cooperative;Alert;Pleasant mood Oral Cavity Assessment: Within Functional Limits Oral Care Completed by SLP: No Oral Cavity - Dentition: Adequate natural dentition Vision: Functional for self-feeding Self-Feeding Abilities: Able to feed self Patient Positioning: Upright in bed;Postural control adequate for testing Baseline Vocal Quality: Normal Volitional Cough: Strong Volitional Swallow: Able to elicit    Oral/Motor/Sensory Function Overall Oral Motor/Sensory Function: Within functional limits   Ice Chips Ice chips: Impaired Presentation: Spoon Pharyngeal Phase Impairments: Cough - Immediate   Thin Liquid Thin Liquid: Impaired Presentation: Straw Pharyngeal  Phase Impairments: Throat Clearing - Delayed    Nectar Thick Nectar Thick Liquid: Not tested   Honey Thick Honey Thick Liquid: Not tested   Puree Puree: Within functional limits Presentation: Spoon   Solid     Solid: Impaired Presentation: Self Fed Pharyngeal Phase Impairments: Cough - Delayed     [name] Parks I. [name] Parks, [place], [place] Office number [phone] Pager [phone]  [name] Parks 04/24/2020,4:51 PM

## 2020-04-24 NOTE — ED Notes (Signed)
sleeping

## 2020-04-24 NOTE — ED Provider Notes (Signed)
[facility] EMERGENCY DEPARTMENT Provider Note   CSN: [id number] Arrival date & time: 04/24/20  0016     History Chief Complaint  Patient presents with  . Altered Mental Status    Raykwon Hobbs is a 74 y.o. male.   Altered Mental Status Presenting symptoms: behavior changes, confusion and disorientation   Severity:  Mild Most recent episode:  Yesterday Episode history:  Single Timing:  Constant Progression:  Improving Chronicity:  Recurrent Context: recent illness   Context: not alcohol use and not dementia   Associated symptoms: fever   Associated symptoms: no abdominal pain        Past Medical History:  Diagnosis Date  . CAP (community acquired pneumonia) 10/17/2013  . COPD (chronic obstructive pulmonary disease) (HCC)   . Hyperlipidemia   . Hypertension   . Migraines    "long time ago; none lately" (10/17/2013)  . OSA on CPAP   . PTSD (post-traumatic stress disorder)   . Shortness of breath dyspnea   . Type II diabetes mellitus [facility])     Patient Active Problem List   Diagnosis Date Noted  . SIRS (systemic inflammatory response syndrome) (HCC) 04/24/2020  . Acute kidney injury (HCC) 06/13/2017  . Lower urinary tract infectious disease 06/12/2017  . Abnormal nuclear stress test 09/21/2014  . Near syncope   . Atypical chest pain   . Diarrhea 08/29/2014  . Tobacco abuse 08/29/2014  . Elevated troponin 08/29/2014  . Dehydration 08/29/2014  . COPD (chronic obstructive pulmonary disease) (HCC) 08/29/2014  . Syncope 08/29/2014  . Diabetes mellitus without complication (HCC)   . SOB (shortness of breath)   . Dyspnea 05/07/2014  . HCAP (healthcare-associated pneumonia) 10/17/2013  . COPD exacerbation (HCC) 10/17/2013  . Leukocytosis 10/17/2013  . Nicotine abuse 10/17/2013  . Diabetes mellitus (HCC) 10/17/2013  . Hypertension 10/17/2013  . Hyperlipidemia 10/17/2013    Past Surgical History:  Procedure Laterality Date  . EXCISIONAL  HEMORRHOIDECTOMY  1970's?  Marland Kitchen JOINT REPLACEMENT    . LEFT HEART CATHETERIZATION WITH CORONARY ANGIOGRAM N/A 09/21/2014   Procedure: LEFT HEART CATHETERIZATION WITH CORONARY ANGIOGRAM;  Surgeon: Peter M [place]az[place], MD;  Location: [facility] CATH LAB;  Service: Cardiovascular;  Laterality: N/A;  . TOTAL KNEE ARTHROPLASTY Right ~ 2012  . TOTAL KNEE ARTHROPLASTY Left ~ 2013       Family History  Problem Relation Age of Onset  . Diabetes Mother   . Heart attack Father   . Diabetes Sister     Social History   Tobacco Use  . Smoking status: Current Every Day Smoker    Packs/day: 1.00    Years: 50.00    Pack years: 50.00    Types: Cigarettes  . Smokeless tobacco: Never Used  Substance Use Topics  . Alcohol use: No    Comment: "used to drink a long time ago; none since the 1970's"  . Drug use: No    Home Medications Prior to Admission medications   Medication Sig Start Date End Date Taking? Authorizing Provider  acetaminophen (TYLENOL) 325 MG tablet Take 650 mg by mouth 3 (three) times daily as needed for mild pain.    [provider]  albuterol (PROVENTIL HFA;VENTOLIN HFA) 108 (90 BASE) MCG/ACT inhaler Inhale 2 puffs into the lungs every 4 (four) hours as needed for wheezing or shortness of breath. 05/08/14   Ardith Dark, MD  alfuzosin (UROXATRAL) 10 MG 24 hr tablet Take 10 mg by mouth daily with breakfast.    [provider]  atorvastatin (LIPITOR) 80 MG tablet Take 40 mg by mouth at bedtime.     [provider]  cephALEXin (KEFLEX) 500 MG capsule Take 1 capsule (500 mg total) by mouth 2 (two) times daily. 06/28/19   Zadie Rhine, MD  chlorthalidone (HYGROTON) 25 MG tablet Take 12.5 mg by mouth daily.    [provider]  cholecalciferol (VITAMIN D) 1000 UNITS tablet Take 1,000 Units by mouth daily.    [provider]  ferrous sulfate 325 (65 FE) MG tablet Take 1 tablet (325 mg total) by mouth daily. 06/28/19   Zadie Rhine, MD  fluticasone  (FLONASE) 50 MCG/ACT nasal spray Place 2 sprays into both nostrils daily as needed for allergies.     [provider]  HYDROcodone-acetaminophen (NORCO/VICODIN) 5-325 MG tablet Take 1 tablet by mouth every 6 (six) hours as needed for severe pain. 06/28/19   Zadie Rhine, MD  insulin aspart (NOVOLOG) 100 UNIT/ML injection Inject 6 Units into the skin 3 (three) times daily with meals. Patient taking differently: Inject 4 Units into the skin 3 (three) times daily with meals.  10/20/13   Dellinger, Tora Kindred, PA-C  insulin glargine (LANTUS) 100 UNIT/ML injection Inject 0.25 mLs (25 Units total) into the skin at bedtime. 10/20/13   Dellinger, Clerance Lav L, PA-C  LORATADINE PO Take by mouth.    [provider]  metFORMIN (GLUCOPHAGE) 500 MG tablet Take 500 mg by mouth 2 (two) times daily with a meal.     [provider]  metoprolol tartrate (LOPRESSOR) 25 MG tablet Take 25 mg by mouth 2 (two) times daily.     [provider]  Multiple Vitamin (MULTIVITAMIN) tablet Take 1 tablet by mouth daily. Centrum Silver    [provider]  omeprazole (PRILOSEC) 20 MG capsule Take 20 mg by mouth 2 (two) times daily.    [provider]  PARoxetine (PAXIL) 40 MG tablet Take 40 mg by mouth daily.    [provider]  potassium chloride SA (K-DUR,KLOR-CON) 20 MEQ tablet Take 20 mEq by mouth daily.    [provider]  senna-docusate (SENOKOT-S) 8.6-50 MG per tablet Take 2 tablets by mouth at bedtime.    [provider]  tamsulosin (FLOMAX) 0.4 MG CAPS capsule Take 0.4 mg by mouth 2 (two) times daily.    [provider]  Tiotropium Bromide-Olodaterol 2.5-2.5 MCG/ACT AERS Inhale 2 puffs into the lungs daily.    [provider]    Allergies    Paxil [paroxetine hcl], Trazodone and nefazodone, and Wellbutrin [bupropion]  Review of Systems   Review of Systems  Constitutional: Positive for fever.  Respiratory: Positive for  cough and shortness of breath.   Gastrointestinal: Negative for abdominal pain.  Genitourinary: Positive for dysuria, flank pain and hematuria.  Psychiatric/Behavioral: Positive for confusion.  All other systems reviewed and are negative.   Physical Exam Updated Vital Signs BP (!) 165/97 (BP Location: Right Arm)   Pulse (!) 119   Temp 98.9 F (37.2 C) (Rectal)   Resp 20   SpO2 97%   Physical Exam Vitals and nursing note reviewed.  Constitutional:      Appearance: He is well-developed.  HENT:     Head: Normocephalic and atraumatic.     Mouth/Throat:     Mouth: Mucous membranes are moist.     Pharynx: Oropharynx is clear.  Eyes:     Pupils: Pupils are equal, round, and reactive to light.  Cardiovascular:  Rate and Rhythm: Normal rate.  Pulmonary:     Effort: Pulmonary effort is normal. No respiratory distress.  Abdominal:     General: There is no distension.  Musculoskeletal:        General: Normal range of motion.     Cervical back: Normal range of motion.  Skin:    General: Skin is warm and dry.     Coloration: Skin is not jaundiced or pale.  Neurological:     General: No focal deficit present.     Mental Status: He is alert.     Cranial Nerves: No cranial nerve deficit.     Sensory: No sensory deficit.     Motor: No weakness.     Coordination: Coordination normal.     Gait: Gait normal.     ED Results / Procedures / Treatments   Labs (all labs ordered are listed, but only abnormal results are displayed) Labs Reviewed  COMPREHENSIVE METABOLIC PANEL - Abnormal; Notable for the following components:      Result Value   Glucose, Bld 180 (*)    Creatinine, Ser 1.32 (*)    Albumin 3.2 (*)    AST 14 (*)    GFR, Estimated 57 (*)    All other components within normal limits  CBC WITH DIFFERENTIAL/PLATELET - Abnormal; Notable for the following components:   WBC 24.5 (*)    Hemoglobin 12.9 (*)    HCT 37.5 (*)    Neutro Abs 20.4 (*)    Monocytes Absolute 1.9  (*)    Abs Immature Granulocytes 0.13 (*)    All other components within normal limits  APTT - Abnormal; Notable for the following components:   aPTT 38 (*)    All other components within normal limits  URINALYSIS, ROUTINE W REFLEX MICROSCOPIC - Abnormal; Notable for the following components:   Color, Urine STRAW (*)    Specific Gravity, Urine 1.004 (*)    Hgb urine dipstick LARGE (*)    Protein, ur 30 (*)    Leukocytes,Ua MODERATE (*)    Bacteria, UA MANY (*)    All other components within normal limits  CULTURE, BLOOD (ROUTINE X 2)  CULTURE, BLOOD (ROUTINE X 2)  URINE CULTURE  RESP PANEL BY RT-PCR (FLU A&B, COVID) ARPGX2  LACTIC ACID, PLASMA  PROTIME-INR  LACTIC ACID, PLASMA    EKG EKG Interpretation  Date/Time:  Wednesday April 24 2020 00:31:46 EST Ventricular Rate:  123 PR Interval:    QRS Duration: 92 QT Interval:  312 QTC Calculation: 447 R Axis:   41 Text Interpretation: Sinus or ectopic atrial tachycardia Anteroseptal infarct, old Repol abnrm suggests ischemia, diffuse leads likely lvh with repol No significant change since last tracing Confirmed by Marily Memos [phone]) on 04/24/2020 12:34:43 AM   Radiology DG Chest Port 1 View  Result Date: 04/24/2020 CLINICAL DATA:  Sepsis EXAM: PORTABLE CHEST 1 VIEW COMPARISON:  02/24/2018 FINDINGS: Interstitial coarsening has progressed since prior examination likely reflecting the patient's underlying emphysema. The lungs are otherwise clear. No pneumothorax or pleural effusion. Cardiac size within normal limits. No acute bone abnormality. IMPRESSION: Progressive interstitial changes likely related to the patient's known underlying emphysema. No radiographic evidence of acute cardiopulmonary disease. Electronically Signed   By: Helyn Numbers MD   On: 04/24/2020 00:59    Procedures .Critical Care Performed by: Marily Memos, MD Authorized by: Marily Memos, MD   Critical care provider statement:    Critical care time  (minutes):  45   Critical  care was necessary to treat or prevent imminent or life-threatening deterioration of the following conditions:  Sepsis   Critical care was time spent personally by me on the following activities:  Discussions with consultants, evaluation of patient's response to treatment, examination of patient, ordering and performing treatments and interventions, ordering and review of laboratory studies, ordering and review of radiographic studies, pulse oximetry, re-evaluation of patient's condition, obtaining history from patient or surrogate and review of old charts   (including critical care time)  Medications Ordered in ED Medications  lactated ringers infusion (has no administration in time range)  cefTRIAXone (ROCEPHIN) 2 g in sodium chloride 0.9 % 100 mL IVPB (0 g Intravenous Stopped 04/24/20 0139)  azithromycin (ZITHROMAX) 500 mg in sodium chloride 0.9 % 250 mL IVPB (0 mg Intravenous Stopped 04/24/20 0453)  lactated ringers bolus 1,000 mL (0 mLs Intravenous Stopped 04/24/20 0352)    And  lactated ringers bolus 1,000 mL (0 mLs Intravenous Stopped 04/24/20 0352)    And  lactated ringers bolus 1,000 mL (0 mLs Intravenous Stopped 04/24/20 0711)  acetaminophen (TYLENOL) tablet 650 mg (650 mg Oral Given 04/24/20 0106)    ED Course  I have reviewed the triage vital signs and the nursing notes.  Pertinent labs & imaging results that were available during my care of the patient were reviewed by me and considered in my medical decision making (see chart for details).    MDM Rules/Calculators/A&P                          Sepsis initiated 2/2 questionable pneumonia vs UTI w/ fever, tachypnea and tachycardia. Coverage for CAP to also cover urine.  xr questionable. CBC w/ leukocytosis c/w infection.  Lactic ok. Bmp ok. Fluids stgill going.  Urine infected.  Discussed with wife regarding disposition, requests observation. Discussed with hospitalist will admit.   Final Clinical  Impression(s) / ED Diagnoses Final diagnoses:  Altered mental status, unspecified altered mental status type  Confusion  Acute cystitis with hematuria    Rx / DC Orders ED Discharge Orders    None       Earlisha Sharples, Barbara CowerJason, MD 04/24/20 [phone]

## 2020-04-24 NOTE — H&P (Signed)
History and Physical        Hospital Admission Note Date: 04/24/2020  Patient name: [name] Parks Medical record number: [id number] Date of birth: [birth year] Age: 74 y.o. Gender: male  PCP: [name], [place], MD    Patient coming from: home   I have [facility] all [facility] in [facility].    Chief Complaint:  Increased urinary frequency, fevers.  Per family, confused in the last 12 hours.  HPI: Patient is a 74 year old male with history of COPD, hypertension, hyperlipidemia, diabetes mellitus type 2 initially was brought to the ER via EMS as family noted the patient to be confused for the last 12 hours prior to admission.  At the time of my examination, patient is fairly alert and oriented x3.  Patient reports that he noticed blood in his urine 2 days ago (currently clear), was having increased urgency and frequency, fevers and chills.  He also felt loss of appetite and has not had much to eat in the last 2 to 3 days.  Typically ambulates with a cane however felt generalized weakness. No nausea vomiting, chest pain or increased shortness of breath.   ED work-up/course:  In ED, temp 100.5 F, respiratory rate 32, tachycardia with heart rate 121, BP 143/93, O2 sats 98% on room air 7, BUN 13, creatinine 1.3.  Creatinine was 1.5 on 06/28/2019 WBCs 24.5, hemoglobin 12.9, hematocrit 37.5, platelets 256 Chest x-ray showed progressive interstitial changes likely related to known emphysema, no acute cardiopulmonary disease UA showed large hemoglobin, many bacteria, WBCs 21-50, RBC 0-5  Review of Systems: Positives marked in 'bold' Constitutional: + fever, chills, diaphoresis, poor appetite and fatigue.  HEENT: Denies photophobia, eye pain, redness, hearing loss, ear pain, congestion, sore throat, rhinorrhea, sneezing, mouth sores, trouble swallowing, neck pain, neck stiffness and  tinnitus.   Respiratory: Denies SOB, DOE, cough, chest tightness,  and wheezing.   Cardiovascular: Denies chest pain, palpitations and leg swelling.  Gastrointestinal: Denies nausea, vomiting, abdominal pain, diarrhea, constipation, blood in stool and abdominal distention.  Genitourinary: Please see HPI Musculoskeletal: Denies myalgias, back pain, joint swelling, arthralgias and gait problem.  Skin: Denies pallor, rash and wound.  Neurological: + Generalized weakness denies dizziness, seizures, syncope,  light-headedness, numbness and headaches.  Hematological: Denies adenopathy. Easy bruising, personal or family bleeding history  Psychiatric/Behavioral: Denies suicidal ideation, mood changes, confusion, nervousness, sleep disturbance and agitation  Past Medical History: Past Medical History:  Diagnosis Date  . CAP (community acquired pneumonia) 10/17/2013  . COPD (chronic obstructive pulmonary disease) ([place])   . Hyperlipidemia   . Hypertension   . Migraines    "long time ago; none lately" (10/17/2013)  . OSA on CPAP   . PTSD (post-traumatic stress disorder)   . Shortness of breath dyspnea   . Type II diabetes mellitus ([place])     Past Surgical History:  Procedure Laterality Date  . EXCISIONAL HEMORRHOIDECTOMY  1970's?  Marland Kitchen JOINT REPLACEMENT    . LEFT HEART CATHETERIZATION WITH CORONARY ANGIOGRAM N/A 09/21/2014   Procedure: LEFT HEART CATHETERIZATION WITH CORONARY ANGIOGRAM;  Surgeon: Peter M Martinique, MD;  Location: [facility] CATH LAB;  Service: Cardiovascular;  Laterality: N/A;  . TOTAL KNEE ARTHROPLASTY  Right ~ 2012  . TOTAL KNEE ARTHROPLASTY Left ~ 2013    Medications: Prior to Admission medications   Medication Sig Start Date End Date Taking? Authorizing Provider  acetaminophen (TYLENOL) 325 MG tablet Take 650 mg by mouth 3 (three) times daily as needed for mild pain.    [provider]  albuterol (PROVENTIL HFA;VENTOLIN HFA) 108 (90 BASE) MCG/ACT inhaler Inhale 2 puffs into the  lungs every 4 (four) hours as needed for wheezing or shortness of breath. 05/08/14   Vivi Barrack, MD  alfuzosin (UROXATRAL) 10 MG 24 hr tablet Take 10 mg by mouth daily with breakfast.    [provider]  atorvastatin (LIPITOR) 80 MG tablet Take 40 mg by mouth at bedtime.     [provider]  cephALEXin (KEFLEX) 500 MG capsule Take 1 capsule (500 mg total) by mouth 2 (two) times daily. 06/28/19   Ripley Fraise, MD  chlorthalidone (HYGROTON) 25 MG tablet Take 12.5 mg by mouth daily.    [provider]  cholecalciferol (VITAMIN D) 1000 UNITS tablet Take 1,000 Units by mouth daily.    [provider]  ferrous sulfate 325 (65 FE) MG tablet Take 1 tablet (325 mg total) by mouth daily. 06/28/19   Ripley Fraise, MD  fluticasone (FLONASE) 50 MCG/ACT nasal spray Place 2 sprays into both nostrils daily as needed for allergies.     [provider]  HYDROcodone-acetaminophen (NORCO/VICODIN) 5-325 MG tablet Take 1 tablet by mouth every 6 (six) hours as needed for severe pain. 06/28/19   Ripley Fraise, MD  insulin aspart (NOVOLOG) 100 UNIT/ML injection Inject 6 Units into the skin 3 (three) times daily with meals. Patient taking differently: Inject 4 Units into the skin 3 (three) times daily with meals.  10/20/13   Dellinger, Bobby Rumpf, PA-C  insulin glargine (LANTUS) 100 UNIT/ML injection Inject 0.25 mLs (25 Units total) into the skin at bedtime. 10/20/13   Dellinger, Haynes Dage L, PA-C  LORATADINE PO Take by mouth.    [provider]  metFORMIN (GLUCOPHAGE) 500 MG tablet Take 500 mg by mouth 2 (two) times daily with a meal.     [provider]  metoprolol tartrate (LOPRESSOR) 25 MG tablet Take 25 mg by mouth 2 (two) times daily.     [provider]  Multiple Vitamin (MULTIVITAMIN) tablet Take 1 tablet by mouth daily. Centrum Silver    [provider]  omeprazole (PRILOSEC) 20 MG capsule Take 20 mg by mouth 2 (two) times daily.     [provider]  PARoxetine (PAXIL) 40 MG tablet Take 40 mg by mouth daily.    [provider]  potassium chloride SA (K-DUR,KLOR-CON) 20 MEQ tablet Take 20 mEq by mouth daily.    [provider]  senna-docusate (SENOKOT-S) 8.6-50 MG per tablet Take 2 tablets by mouth at bedtime.    [provider]  tamsulosin (FLOMAX) 0.4 MG CAPS capsule Take 0.4 mg by mouth 2 (two) times daily.    [provider]  Tiotropium Bromide-Olodaterol 2.5-2.5 MCG/ACT AERS Inhale 2 puffs into the lungs daily.    [provider]    Allergies:   Allergies  Allergen Reactions  . Paxil [Paroxetine Hcl]     Pt reports allergy from [place] paperwork (pt is prescribed this med)  . Trazodone And Nefazodone   . Wellbutrin [Bupropion]     Social History:  reports that he has been smoking cigarettes. He has a 50.00 pack-year smoking history. He has never  used smokeless tobacco. He reports that he does not drink alcohol and does not use drugs.  Family History: Family History  Problem Relation Age of Onset  . Diabetes Mother   . Heart attack Father   . Diabetes Sister     Physical Exam: Blood pressure (!) 152/106, pulse (!) 110, temperature 98.9 F (37.2 C), temperature source Rectal, resp. rate 20, SpO2 98 %. General: Alert, awake, oriented x3, in no acute distress. Eyes: pink conjunctiva,anicteric sclera, pupils equal and reactive to light and accomodation, HEENT: normocephalic, atraumatic, oropharynx clear Neck: supple, no masses or lymphadenopathy, no goiter, no bruits, no JVD CVS: Regular rate and rhythm, without murmurs, rubs or gallops. No lower extremity edema Resp : Scattered rhonchi bilaterally GI : Soft, nontender, nondistended, positive bowel sounds, no masses. No hepatomegaly. No hernia.  Musculoskeletal: No clubbing or cyanosis, positive pedal pulses. No contracture. ROM intact  Neuro: Grossly intact, no focal neurological deficits, strength 5/5  upper and lower extremities bilaterally Psych: alert and oriented x 3, normal mood and affect Skin: no rashes or lesions, warm and dry   LABS on Admission: I have personally reviewed all the labs and imagings below    Basic Metabolic Panel: Recent Labs  Lab 04/24/20 0044  NA 137  K 3.6  CL 101  CO2 22  GLUCOSE 180*  BUN 13  CREATININE 1.32*  CALCIUM 9.2   Liver Function Tests: Recent Labs  Lab 04/24/20 0044  AST 14*  ALT 12  ALKPHOS 97  BILITOT 1.2  PROT 7.0  ALBUMIN 3.2*   No results for input(s): LIPASE, AMYLASE in the last 168 hours. No results for input(s): AMMONIA in the last 168 hours. CBC: Recent Labs  Lab 04/24/20 0044  WBC 24.5*  NEUTROABS 20.4*  HGB 12.9*  HCT 37.5*  MCV 88.9  PLT 256   Cardiac Enzymes: No results for input(s): CKTOTAL, CKMB, CKMBINDEX, TROPONINI in the last 168 hours. BNP: Invalid input(s): POCBNP CBG: No results for input(s): GLUCAP in the last 168 hours.  Radiological Exams on Admission:  DG Chest Port 1 View  Result Date: 04/24/2020 CLINICAL DATA:  Sepsis EXAM: PORTABLE CHEST 1 VIEW COMPARISON:  02/24/2018 FINDINGS: Interstitial coarsening has progressed since prior examination likely reflecting the patient's underlying emphysema. The lungs are otherwise clear. No pneumothorax or pleural effusion. Cardiac size within normal limits. No acute bone abnormality. IMPRESSION: Progressive interstitial changes likely related to the patient's known underlying emphysema. No radiographic evidence of acute cardiopulmonary disease. Electronically Signed   By: Fidela Salisbury MD   On: 04/24/2020 00:59      EKG: Independently reviewed. *Sinus tachycardia for with repolarization changes   Assessment/Plan Principal Problem:   Sepsis ([place]) secondary to UTI, CAP/aspiration, present on admission -Patient met sepsis criteria at the time of admission with fevers, tachypnea, tachycardia, leukocytosis, source secondary to UTI, possibility of  aspiration pneumonia.  Patient did mention that he has occasionally felt difficulty eating, possibly aspirating. -Continue IV fluid hydration, follow urine culture and sensitivities, blood cultures, procalcitonin -Continue, IV Rocephin -SLP evaluation, for now placed on dysphagia 2 diet  Active Problems:    Diabetes mellitus ([place]), type II -Obtain hemoglobin A1c, placed on Lantus 20 units nightly, sliding scale insulin sensitive     Hypertension -Labetalol 10 mg IV x1, resume chlorthalidone, metoprolol, home dose    Hyperlipidemia -Continue Lipitor    Tobacco abuse -Nicotine cessation counseling provided, placed on nicotine patch    COPD (chronic obstructive pulmonary disease) ([place]) -Currently no acute  wheezing, continue duo nebs as needed  BPH Continue Flomax   DVT prophylaxis: Heparin subcu  CODE STATUS: Full CODE STATUS  Consults called: None  Family Communication: Admission, patients condition and plan of care including tests being ordered have been discussed with the patient  who indicates understanding and agree with the plan and Code Status  Admission status:   The medical decision making on this patient was of high complexity and the patient is at high risk for clinical deterioration, therefore this is a level 3 admission.  Severity of Illness:      The appropriate patient status for this patient is OBSERVATION. Observation status is judged to be reasonable and necessary in order to provide the required intensity of service to ensure the patient's safety. The patient's presenting symptoms, physical exam findings, and initial radiographic and laboratory data in the context of their medical condition is felt to place them at decreased risk for further clinical deterioration. Furthermore, it is anticipated that the patient will be medically stable for discharge from the hospital within 2 midnights of admission. The following factors support the patient status of  observation.   " The patient's presenting symptoms include UTI, sepsis, possible aspiration pneumonia " The physical exam findings include bilateral scattered rhonchi " The initial radiographic and laboratory data are UA positive for UTI, question pneumonia on chest x-ray     Time Spent on Admission: 70 minutes     Kyair Ditommaso M.D. [facility] 04/24/2020, 10:50 AM

## 2020-04-24 NOTE — ED Notes (Signed)
Pt aware urine is needed. Urinal placed at bedside

## 2020-04-24 NOTE — ED Triage Notes (Signed)
Pt [facility] from home with complaints from family of patient being altered for the last 12 hours. Pt/family noticed blood in patients urine yesterday. Pt alert and oriented x4 with ems. Ems states patient is very warm to the touch but temp for them is 98.62f

## 2020-04-25 ENCOUNTER — Observation Stay (HOSPITAL_COMMUNITY): Payer: No Typology Code available for payment source

## 2020-04-25 DIAGNOSIS — E785 Hyperlipidemia, unspecified: Secondary | ICD-10-CM | POA: Diagnosis present

## 2020-04-25 DIAGNOSIS — G9341 Metabolic encephalopathy: Secondary | ICD-10-CM | POA: Diagnosis present

## 2020-04-25 DIAGNOSIS — R6520 Severe sepsis without septic shock: Secondary | ICD-10-CM

## 2020-04-25 DIAGNOSIS — Z20822 Contact with and (suspected) exposure to covid-19: Secondary | ICD-10-CM | POA: Diagnosis present

## 2020-04-25 DIAGNOSIS — Z888 Allergy status to other drugs, medicaments and biological substances status: Secondary | ICD-10-CM | POA: Diagnosis not present

## 2020-04-25 DIAGNOSIS — I10 Essential (primary) hypertension: Secondary | ICD-10-CM | POA: Diagnosis present

## 2020-04-25 DIAGNOSIS — Z79899 Other long term (current) drug therapy: Secondary | ICD-10-CM | POA: Diagnosis not present

## 2020-04-25 DIAGNOSIS — E119 Type 2 diabetes mellitus without complications: Secondary | ICD-10-CM

## 2020-04-25 DIAGNOSIS — E876 Hypokalemia: Secondary | ICD-10-CM | POA: Diagnosis not present

## 2020-04-25 DIAGNOSIS — R4182 Altered mental status, unspecified: Secondary | ICD-10-CM | POA: Diagnosis present

## 2020-04-25 DIAGNOSIS — J449 Chronic obstructive pulmonary disease, unspecified: Secondary | ICD-10-CM

## 2020-04-25 DIAGNOSIS — A419 Sepsis, unspecified organism: Principal | ICD-10-CM

## 2020-04-25 DIAGNOSIS — F4310 Post-traumatic stress disorder, unspecified: Secondary | ICD-10-CM | POA: Diagnosis present

## 2020-04-25 DIAGNOSIS — N390 Urinary tract infection, site not specified: Secondary | ICD-10-CM

## 2020-04-25 DIAGNOSIS — Z885 Allergy status to narcotic agent status: Secondary | ICD-10-CM | POA: Diagnosis not present

## 2020-04-25 DIAGNOSIS — Z96653 Presence of artificial knee joint, bilateral: Secondary | ICD-10-CM | POA: Diagnosis present

## 2020-04-25 DIAGNOSIS — Z23 Encounter for immunization: Secondary | ICD-10-CM | POA: Diagnosis not present

## 2020-04-25 DIAGNOSIS — N400 Enlarged prostate without lower urinary tract symptoms: Secondary | ICD-10-CM | POA: Diagnosis present

## 2020-04-25 DIAGNOSIS — G4733 Obstructive sleep apnea (adult) (pediatric): Secondary | ICD-10-CM | POA: Diagnosis present

## 2020-04-25 DIAGNOSIS — J690 Pneumonitis due to inhalation of food and vomit: Secondary | ICD-10-CM | POA: Diagnosis present

## 2020-04-25 DIAGNOSIS — Z794 Long term (current) use of insulin: Secondary | ICD-10-CM | POA: Diagnosis not present

## 2020-04-25 DIAGNOSIS — F17210 Nicotine dependence, cigarettes, uncomplicated: Secondary | ICD-10-CM | POA: Diagnosis present

## 2020-04-25 DIAGNOSIS — J439 Emphysema, unspecified: Secondary | ICD-10-CM | POA: Diagnosis present

## 2020-04-25 DIAGNOSIS — G9340 Encephalopathy, unspecified: Secondary | ICD-10-CM

## 2020-04-25 DIAGNOSIS — Z8249 Family history of ischemic heart disease and other diseases of the circulatory system: Secondary | ICD-10-CM | POA: Diagnosis not present

## 2020-04-25 DIAGNOSIS — Z833 Family history of diabetes mellitus: Secondary | ICD-10-CM | POA: Diagnosis not present

## 2020-04-25 LAB — CBC
HCT: 31.3 % — ABNORMAL LOW (ref 39.0–52.0)
Hemoglobin: 10.4 g/dL — ABNORMAL LOW (ref 13.0–17.0)
MCH: 29.6 pg (ref 26.0–34.0)
MCHC: 33.2 g/dL (ref 30.0–36.0)
MCV: 89.2 fL (ref 80.0–100.0)
Platelets: 206 10*3/uL (ref 150–400)
RBC: 3.51 MIL/uL — ABNORMAL LOW (ref 4.22–5.81)
RDW: 14.3 % (ref 11.5–15.5)
WBC: 15.6 10*3/uL — ABNORMAL HIGH (ref 4.0–10.5)
nRBC: 0 % (ref 0.0–0.2)

## 2020-04-25 LAB — GLUCOSE, CAPILLARY
Glucose-Capillary: 125 mg/dL — ABNORMAL HIGH (ref 70–99)
Glucose-Capillary: 159 mg/dL — ABNORMAL HIGH (ref 70–99)
Glucose-Capillary: 126 mg/dL — ABNORMAL HIGH (ref 70–99)
Glucose-Capillary: 145 mg/dL — ABNORMAL HIGH (ref 70–99)

## 2020-04-25 LAB — COMPREHENSIVE METABOLIC PANEL
ALT: 11 U/L (ref 0–44)
AST: 17 U/L (ref 15–41)
Albumin: 2.7 g/dL — ABNORMAL LOW (ref 3.5–5.0)
Alkaline Phosphatase: 79 U/L (ref 38–126)
Anion gap: 13 (ref 5–15)
BUN: 9 mg/dL (ref 8–23)
CO2: 26 mmol/L (ref 22–32)
Calcium: 9.4 mg/dL (ref 8.9–10.3)
Chloride: 103 mmol/L (ref 98–111)
Creatinine, Ser: 1.18 mg/dL (ref 0.61–1.24)
GFR, Estimated: >60 mL/min (ref >60)
Glucose, Bld: 119 mg/dL — ABNORMAL HIGH (ref 70–99)
Potassium: 2.9 mmol/L — ABNORMAL LOW (ref 3.5–5.1)
Sodium: 142 mmol/L (ref 135–145)
Total Bilirubin: 1 mg/dL (ref 0.3–1.2)
Total Protein: 5.8 g/dL — ABNORMAL LOW (ref 6.5–8.1)

## 2020-04-25 LAB — MAGNESIUM: Magnesium: 1.6 mg/dL — ABNORMAL LOW (ref 1.7–2.4)

## 2020-04-25 LAB — URINE CULTURE

## 2020-04-25 MED ORDER — POTASSIUM CHLORIDE 10 MEQ/100ML IV SOLN
10.0000 meq | INTRAVENOUS | Status: AC
  Administered 2020-04-25 (×2): 10 meq via INTRAVENOUS
  Filled 2020-04-25 (×2): qty 100

## 2020-04-25 MED ORDER — LORATADINE 10 MG PO TABS
10.0000 mg | ORAL_TABLET | Freq: Every day | ORAL | Status: DC
  Administered 2020-04-25 – 2020-04-27 (×3): 10 mg via ORAL
  Filled 2020-04-25 (×3): qty 1

## 2020-04-25 MED ORDER — POTASSIUM CHLORIDE CRYS ER 20 MEQ PO TBCR
40.0000 meq | EXTENDED_RELEASE_TABLET | Freq: Once | ORAL | Status: AC
  Administered 2020-04-25: 40 meq via ORAL
  Filled 2020-04-25: qty 2

## 2020-04-25 MED ORDER — PANTOPRAZOLE SODIUM 40 MG PO TBEC
40.0000 mg | DELAYED_RELEASE_TABLET | Freq: Every day | ORAL | Status: DC
  Administered 2020-04-25 – 2020-04-27 (×3): 40 mg via ORAL
  Filled 2020-04-25 (×3): qty 1

## 2020-04-25 MED ORDER — AZITHROMYCIN 250 MG PO TABS
500.0000 mg | ORAL_TABLET | Freq: Every day | ORAL | Status: DC
  Administered 2020-04-26 – 2020-04-27 (×2): 500 mg via ORAL
  Filled 2020-04-25 (×2): qty 2

## 2020-04-25 MED ORDER — PAROXETINE HCL 20 MG PO TABS: 40.0000 mg | ORAL_TABLET | Freq: Every day | ORAL | Status: DC

## 2020-04-25 MED ORDER — UMECLIDINIUM BROMIDE 62.5 MCG/INH IN AEPB
1.0000 | INHALATION_SPRAY | Freq: Every day | RESPIRATORY_TRACT | Status: DC
  Administered 2020-04-25 – 2020-04-27 (×3): 1 via RESPIRATORY_TRACT
  Filled 2020-04-25: qty 7

## 2020-04-25 MED ORDER — MAGNESIUM SULFATE 2 GM/50ML IV SOLN
2.0000 g | Freq: Once | INTRAVENOUS | Status: AC
  Administered 2020-04-25: 2 g via INTRAVENOUS
  Filled 2020-04-25: qty 50

## 2020-04-25 MED ORDER — POTASSIUM CHLORIDE 10 MEQ/100ML IV SOLN
INTRAVENOUS | Status: AC
  Administered 2020-04-25: 10 meq via INTRAVENOUS
  Filled 2020-04-25: qty 100

## 2020-04-25 NOTE — Progress Notes (Signed)
PT Cancellation Note  Patient Details Name: [name] Parks MRN: [id number] DOB: Sep 18, 1945   Cancelled Treatment:    Reason Eval/Treat Not Completed: Patient at procedure or test/unavailable   [name] Parks 04/25/2020, 10:51 AM Merryl Hacker, PT Acute Rehabilitation Services Pager: [phone] Office: [phone]

## 2020-04-25 NOTE — Progress Notes (Addendum)
[facility] PROGRESS NOTE   Archer Moist DJS:[id number] DOB: Apr 09, 1946 DOA: 04/24/2020  PCP: [name], [place], MD  Brief History/Interval Summary: 74 year old male with history of COPD, hypertension, hyperlipidemia, diabetes mellitus type 2 initially was brought to the ER via EMS as family noted the patient to be confused for the last 12 hours prior to admission.    Patient mention noticing blood in the urine with increased urgency and frequency.  Also mention fever and chills.  Loss of appetite.  Hospitalized for sepsis due to UTI.    Consultants: None  Procedures: None  Antibiotics: Anti-infectives (From admission, onward)   Start     Dose/Rate Route Frequency Ordered Stop   04/25/20 0200  cefTRIAXone (ROCEPHIN) 1 g in sodium chloride 0.9 % 100 mL IVPB        1 g 200 mL/hr over 30 Minutes Intravenous Every 24 hours 04/24/20 1339     04/24/20 0045  cefTRIAXone (ROCEPHIN) 2 g in sodium chloride 0.9 % 100 mL IVPB  Status:  Discontinued        2 g 200 mL/hr over 30 Minutes Intravenous Every 24 hours 04/24/20 0044 04/24/20 1357   04/24/20 0045  azithromycin (ZITHROMAX) 500 mg in sodium chloride 0.9 % 250 mL IVPB        500 mg 250 mL/hr over 60 Minutes Intravenous Every 24 hours 04/24/20 0044        Subjective/Interval History: Patient still a little bit distracted but denies any abdominal pain this morning.  Has noticed some urinary frequency and dysuria.  Denies any nausea vomiting.  Has a chronic cough as a result of his smoking.  Denies any chest pain.     Assessment/Plan:  Urinary tract infection with sepsis present on admission/concern for aspiration versus community-acquired pneumonia Patient met sepsis criteria at the time of admission.  Patient remains on ceftriaxone and azithromycin.  Multiple species noted on urine culture.  We will send another sample.  WBC noted to be elevated but better than yesterday.  Procalcitonin 0.55.  Follow-up on blood cultures. Also  concern for pneumonia.  Aspiration was a consideration.  Patient seen by speech therapy.  He does have a cough but is chronic according to the patient.  He is a smoker.  He was counseled.  Currently saturating normal on room air.  Acute metabolic encephalopathy Noted to be confused per family members.  Seems to be stable currently.  Still distracted.  No focal deficits noted.  Likely due to UTI.  Hypokalemia and hypomagnesemia This will be repleted.  Diabetes mellitus type 2, controlled Continue SSI.  Continue Lantus.  Metformin is currently on hold.  Essential hypertension Continue metoprolol.  Hold chlorthalidone due to significant hypokalemia.  Labetalol as needed.  Hyperlipidemia Continue Lipitor  Tobacco abuse Counseled.  Nicotine patch.  COPD Currently not in acute exacerbation.  Continue inhalers.  History of BPH Continue home medications.  DVT Prophylaxis: Subcutaneous heparin Code Status: Full code Family Communication: Discussed with the patient.  No family at bedside Disposition Plan: Hopefully return home when improved.  PT and OT evaluation  Status is: Observation  The patient will require care spanning > 2 midnights and should be moved to inpatient because: Altered mental status, IV treatments appropriate due to intensity of illness or inability to take PO and Inpatient level of care appropriate due to severity of illness  Dispo: The patient is from: Home              Anticipated d/c is  to: Home              Anticipated d/c date is: 1 day              Patient currently is not medically stable to d/c.       Medications:  Scheduled: . alfuzosin  10 mg Oral Q breakfast  . arformoterol  15 mcg Nebulization BID  . atorvastatin  40 mg Oral QHS  . cholecalciferol  1,000 Units Oral Daily  . heparin  5,000 Units Subcutaneous Q8H  . insulin aspart  0-5 Units Subcutaneous QHS  . insulin aspart  0-9 Units Subcutaneous TID WC  . insulin glargine  20 Units  Subcutaneous QHS  . metoprolol tartrate  25 mg Oral BID  . multivitamin with minerals  1 tablet Oral Daily  . nicotine  14 mg Transdermal Daily  . senna-docusate  2 tablet Oral QHS  . tamsulosin  0.4 mg Oral BID   Continuous: . azithromycin 500 mg (04/25/20 0111)  . cefTRIAXone (ROCEPHIN)  IV 1 g (04/25/20 0500)  . lactated ringers 125 mL/hr at 04/25/20 0645  . potassium chloride 10 mEq (04/25/20 0937)   XLK:GMWNUUVOZDGUY **OR** acetaminophen, fluticasone, HYDROcodone-acetaminophen, ipratropium-albuterol   Objective:  Vital Signs  Vitals:   04/24/20 2122 04/24/20 2210 04/25/20 0555 04/25/20 0853  BP: (!) 148/80  132/76   Pulse: (!) 106  81 76  Resp: (!) _0 Temp: 98.8 F (37.1 C)  98.3 F (36.8 C)   TempSrc: Oral  Oral   SpO2: 96%  98%     Intake/Output Summary (Last 24 hours) at 04/25/2020 0949 Last data filed at 04/25/2020 0600 Gross per 24 hour  Intake 2818.36 ml  Output 1550 ml  Net 1268.36 ml   There were no vitals filed for this visit.  General appearance: Awake alert.  In no distress. distracted Resp: Clear to auscultation bilaterally.  Normal effort Cardio: S1-S2 is normal regular.  No S3-S4.  No rubs murmurs or bruit GI: Abdomen is soft.  Nontender nondistended.  Bowel sounds are present normal.  No masses organomegaly Extremities: Moving all his extremities Neurologic: Alert.  Oriented to place.  Had difficulty answering the name of the president.  Knew the month.  Initially got the year incorrect..  No focal neurological deficits.    Lab Results:  Data Reviewed: I have personally reviewed following labs and imaging studies  CBC: Recent Labs  Lab 04/24/20 0044 04/25/20 0322  WBC 24.5* 15.6*  NEUTROABS 20.4*  --   HGB 12.9* 10.4*  HCT 37.5* 31.3*  MCV 88.9 89.2  PLT 256 403    Basic Metabolic Panel: Recent Labs  Lab 04/24/20 0044 04/25/20 0322  NA 137 142  K 3.6 2.9*  CL 101 103  CO2 22 26  GLUCOSE 180* 119*  BUN 13 9   CREATININE 1.32* 1.18  CALCIUM 9.2 9.4  MG  --  1.6*    GFR: CrCl cannot be calculated (Unknown ideal weight.).  Liver Function Tests: Recent Labs  Lab 04/24/20 0044 04/25/20 0322  AST 14* 17  ALT 12 11  ALKPHOS 97 79  BILITOT 1.2 1.0  PROT 7.0 5.8*  ALBUMIN 3.2* 2.7*     Coagulation Profile: Recent Labs  Lab 04/24/20 0044  INR 1.2    Cardiac Enzymes: Recent Labs  Lab 04/24/20 1056  CKTOTAL 106     HbA1C: Recent Labs    04/24/20 0044  HGBA1C 6.7*    CBG: Recent Labs  Lab 04/24/20 1603 04/24/20 2137 04/25/20 0722  GLUCAP 178* 231* 125*      Recent Results (from the past 240 hour(s))  Blood Culture (routine x 2)     Status: None (Preliminary result)   Collection Time: 04/24/20 12:53 AM   Specimen: BLOOD LEFT HAND  Result Value Ref Range Status   Specimen Description BLOOD LEFT HAND  Final   Special Requests   Final    BOTTLES DRAWN AEROBIC AND ANAEROBIC Blood Culture adequate volume   Culture   Final    NO GROWTH 1 DAY Performed at [facility], [place] [address], [place], [place] [address]    Report Status PENDING  Incomplete  Blood Culture (routine x 2)     Status: None (Preliminary result)   Collection Time: 04/24/20  1:50 AM   Specimen: BLOOD LEFT HAND  Result Value Ref Range Status   Specimen Description BLOOD LEFT HAND  Final   Special Requests   Final    BOTTLES DRAWN AEROBIC AND ANAEROBIC Blood Culture adequate volume   Culture   Final    NO GROWTH 1 DAY Performed at [facility], [place] [address], [place], [place] [address]    Report Status PENDING  Incomplete  Urine culture     Status: Abnormal   Collection Time: 04/24/20  3:11 AM   Specimen: In/Out Cath Urine  Result Value Ref Range Status   Specimen Description IN/OUT CATH URINE  Final   Special Requests   Final    NONE Performed at [facility], [place] [address], [place], [place] [address]    Culture MULTIPLE SPECIES PRESENT, SUGGEST RECOLLECTION (A)   Final   Report Status 04/25/2020 FINAL  Final  Resp Panel by RT-PCR (Flu A&B, Covid) Nasopharyngeal Swab     Status: None   Collection Time: 04/24/20  8:36 AM   Specimen: Nasopharyngeal Swab; Nasopharyngeal(NP) swabs in vial transport medium  Result Value Ref Range Status   SARS Coronavirus 2 by RT PCR NEGATIVE NEGATIVE Final    Comment: (NOTE) SARS-CoV-2 target nucleic acids are NOT DETECTED.  The SARS-CoV-2 RNA is generally detectable in upper respiratory specimens during the acute phase of infection. The lowest concentration of SARS-CoV-2 viral copies this assay can detect is 138 copies/mL. A negative result does not preclude SARS-Cov-2 infection and should not be used as the sole basis for treatment or other patient management decisions. A negative result may occur with  improper specimen collection/handling, submission of specimen other than nasopharyngeal swab, presence of viral mutation(s) within the areas targeted by this assay, and inadequate number of viral copies(<138 copies/mL). A negative result must be combined with clinical observations, patient history, and epidemiological information. The expected result is Negative.  Fact Sheet for Patients:  EntrepreneurPulse.com.au  Fact Sheet for Healthcare Providers:  IncredibleEmployment.be  This test is no t yet approved or cleared by the [place] FDA and  has been authorized for detection and/or diagnosis of SARS-CoV-2 by FDA under an Emergency Use Authorization (EUA). This EUA will remain  in effect (meaning this test can be used) for the duration of the COVID-19 declaration under Section 564(b)(1) of the Act, 21 U.S.C.section 360bbb-3(b)(1), unless the authorization is terminated  or revoked sooner.       Influenza A by PCR NEGATIVE NEGATIVE Final   Influenza B by PCR NEGATIVE NEGATIVE Final    Comment: (NOTE) The Xpert Xpress SARS-CoV-2/FLU/RSV plus assay is intended as an  aid in the diagnosis  of influenza from Nasopharyngeal swab specimens and should not be used as a sole basis for treatment. Nasal washings and aspirates are unacceptable for Xpert Xpress SARS-CoV-2/FLU/RSV testing.  Fact Sheet for Patients: EntrepreneurPulse.com.au  Fact Sheet for Healthcare Providers: IncredibleEmployment.be  This test is not yet approved or cleared by the [place] FDA and has been authorized for detection and/or diagnosis of SARS-CoV-2 by FDA under an Emergency Use Authorization (EUA). This EUA will remain in effect (meaning this test can be used) for the duration of the COVID-19 declaration under Section 564(b)(1) of the Act, 21 U.S.C. section 360bbb-3(b)(1), unless the authorization is terminated or revoked.  Performed at [facility], [place] [address] [place], [place] [address]       Radiology Studies: DG Chest Port 1 View  Result Date: 04/24/2020 CLINICAL DATA:  Sepsis EXAM: PORTABLE CHEST 1 VIEW COMPARISON:  02/24/2018 FINDINGS: Interstitial coarsening has progressed since prior examination likely reflecting the patient's underlying emphysema. The lungs are otherwise clear. No pneumothorax or pleural effusion. Cardiac size within normal limits. No acute bone abnormality. IMPRESSION: Progressive interstitial changes likely related to the patient's known underlying emphysema. No radiographic evidence of acute cardiopulmonary disease. Electronically Signed   By: Fidela Salisbury MD   On: 04/24/2020 00:59       LOS: 0 days   [facility] Pager on [email or web address]  04/25/2020, 9:49 AM

## 2020-04-25 NOTE — Progress Notes (Signed)
Modified Barium Swallow Progress Note  Patient Details  Name: [name] Parks MRN: [id number] Date of Birth: 09/29/1945  Today's Date: 04/25/2020  Modified Barium Swallow completed.  Full report located under Chart Review in the Imaging Section.  Brief recommendations include the following:  Clinical Impression  Pt was seen in radiology suite for modified barium swallow study. Trials of puree solids, regular texture solids, a 32mm barium tablet, and thin liquids via cup and straw were administered. Pt's oropharyngeal swallow mechanism was within functional limits. Transient penetration (PAS 2) was noted with consecutive swallows of thin liquids, but this is considered to be WNL and no other instances of laryngeal invasion were observed. It is recommended that a regular texture diet with thin liquids be continued at this time. Considering pt's presentation on 12/1, SLP will see patient once more to ensure tolerance of the diet; however, further skilled SLP services will likely not be needed beyond that.    Swallow Evaluation Recommendations       SLP Diet Recommendations: Regular solids;Thin liquid   Liquid Administration via: Cup;Straw   Medication Administration: Whole meds with liquid   Supervision: Patient able to self feed   Compensations: Slow rate;Small sips/bites   Postural Changes: Remain semi-upright after after feeds/meals (Comment)         [name] I. Vear Clock, MS, CCC-SLP Acute [facility] Services Office number [phone] Pager [phone]   Scheryl Marten 04/25/2020,12:37 PM

## 2020-04-25 NOTE — Plan of Care (Signed)

## 2020-04-26 DIAGNOSIS — R4182 Altered mental status, unspecified: Secondary | ICD-10-CM

## 2020-04-26 LAB — BASIC METABOLIC PANEL
Anion gap: 12 (ref 5–15)
BUN: 6 mg/dL — ABNORMAL LOW (ref 8–23)
CO2: 25 mmol/L (ref 22–32)
Calcium: 9.6 mg/dL (ref 8.9–10.3)
Chloride: 104 mmol/L (ref 98–111)
Creatinine, Ser: 1.24 mg/dL (ref 0.61–1.24)
GFR, Estimated: >60 mL/min (ref >60)
Glucose, Bld: 119 mg/dL — ABNORMAL HIGH (ref 70–99)
Potassium: 3.4 mmol/L — ABNORMAL LOW (ref 3.5–5.1)
Sodium: 141 mmol/L (ref 135–145)

## 2020-04-26 LAB — CBC
HCT: 33.6 % — ABNORMAL LOW (ref 39.0–52.0)
Hemoglobin: 11.5 g/dL — ABNORMAL LOW (ref 13.0–17.0)
MCH: 30 pg (ref 26.0–34.0)
MCHC: 34.2 g/dL (ref 30.0–36.0)
MCV: 87.7 fL (ref 80.0–100.0)
Platelets: 239 10*3/uL (ref 150–400)
RBC: 3.83 MIL/uL — ABNORMAL LOW (ref 4.22–5.81)
RDW: 14.2 % (ref 11.5–15.5)
WBC: 10.8 10*3/uL — ABNORMAL HIGH (ref 4.0–10.5)
nRBC: 0 % (ref 0.0–0.2)

## 2020-04-26 LAB — URINE CULTURE: Culture: <10000 — AB

## 2020-04-26 LAB — GLUCOSE, CAPILLARY
Glucose-Capillary: 119 mg/dL — ABNORMAL HIGH (ref 70–99)
Glucose-Capillary: 127 mg/dL — ABNORMAL HIGH (ref 70–99)
Glucose-Capillary: 117 mg/dL — ABNORMAL HIGH (ref 70–99)
Glucose-Capillary: 101 mg/dL — ABNORMAL HIGH (ref 70–99)

## 2020-04-26 MED ORDER — BISMUTH SUBSALICYLATE 262 MG/15ML PO SUSP
30.0000 mL | ORAL | Status: DC | PRN
  Administered 2020-04-26: 30 mL via ORAL
  Filled 2020-04-26: qty 236

## 2020-04-26 MED ORDER — POTASSIUM CHLORIDE CRYS ER 20 MEQ PO TBCR
40.0000 meq | EXTENDED_RELEASE_TABLET | Freq: Once | ORAL | Status: AC
  Administered 2020-04-26: 40 meq via ORAL
  Filled 2020-04-26: qty 2

## 2020-04-26 MED ORDER — PNEUMOCOCCAL VAC POLYVALENT 25 MCG/0.5ML IJ INJ: 0.5000 mL | INJECTION | INTRAMUSCULAR | Status: DC

## 2020-04-26 MED ORDER — HYDRALAZINE HCL 20 MG/ML IJ SOLN: 5.0000 mg | Freq: Four times a day (QID) | INTRAMUSCULAR | Status: DC | PRN

## 2020-04-26 MED ORDER — AMLODIPINE BESYLATE 5 MG PO TABS
5.0000 mg | ORAL_TABLET | Freq: Every day | ORAL | Status: DC
  Administered 2020-04-26 – 2020-04-27 (×2): 5 mg via ORAL
  Filled 2020-04-26 (×2): qty 1

## 2020-04-26 MED ORDER — METOPROLOL TARTRATE 50 MG PO TABS
50.0000 mg | ORAL_TABLET | Freq: Two times a day (BID) | ORAL | Status: DC
  Administered 2020-04-26 – 2020-04-27 (×2): 50 mg via ORAL
  Filled 2020-04-26 (×2): qty 1

## 2020-04-26 NOTE — Plan of Care (Signed)

## 2020-04-26 NOTE — Progress Notes (Signed)
[facility] PROGRESS NOTE   [name] Parks SWN:[id number] DOB: [birth year] DOA: 04/24/2020  PCP: [name], [place], MD  Brief History/Interval Summary: 74 year old male with history of COPD, hypertension, hyperlipidemia, diabetes mellitus type 2 initially was brought to the ER via EMS as family noted the patient to be confused for the last 12 hours prior to admission.    Patient mention noticing blood in the urine with increased urgency and frequency.  Also mention fever and chills.  Loss of appetite.  Hospitalized for sepsis due to UTI.    Consultants: None  Procedures: None  Antibiotics: Anti-infectives (From admission, onward)   Start     Dose/Rate Route Frequency Ordered Stop   04/26/20 1000  azithromycin (ZITHROMAX) tablet 500 mg        500 mg Oral Daily 04/25/20 1001 04/29/20 0959   04/25/20 0200  cefTRIAXone (ROCEPHIN) 1 g in sodium chloride 0.9 % 100 mL IVPB        1 g 200 mL/hr over 30 Minutes Intravenous Every 24 hours 04/24/20 1339     04/24/20 0045  cefTRIAXone (ROCEPHIN) 2 g in sodium chloride 0.9 % 100 mL IVPB  Status:  Discontinued        2 g 200 mL/hr over 30 Minutes Intravenous Every 24 hours 04/24/20 0044 04/24/20 1357   04/24/20 0045  azithromycin (ZITHROMAX) 500 mg in sodium chloride 0.9 % 250 mL IVPB  Status:  Discontinued        500 mg 250 mL/hr over 60 Minutes Intravenous Every 24 hours 04/24/20 0044 04/25/20 1001      Subjective/Interval History: Patient continues to feel better.  Cough is improving.  Denies nausea vomiting.  Confusion has improved.  Urinary dysuria is also improved    Assessment/Plan:  Urinary tract infection with sepsis present on admission/concern for aspiration versus community-acquired pneumonia Patient met sepsis criteria at the time of admission.  Patient remains on ceftriaxone and azithromycin.  Multiple species noted on urine culture.  Another urine sample was sent for culture.  WBC continues to improve.  Remains afebrile.  Procalcitonin 0.55.  Follow-up on blood cultures. Also concern for pneumonia.  Aspiration was a consideration.  Patient seen by speech therapy.  He does have a cough but is chronic according to the patient.  He is a smoker.   Stable from a respiratory standpoint.  Acute metabolic encephalopathy Noted to be confused per family members. Likely due to infection.   Mentation seems to be back to baseline.  No focal deficits.    Hypokalemia and hypomagnesemia Potassium level has improved.  Will be given additional dose today.  Magnesium was also supplemented yesterday.  Diabetes mellitus type 2, controlled Continue SSI.  Continue Lantus.  Metformin is currently on hold.  Essential hypertension Continue metoprolol.  Holding chlorthalidone due to significant hypokalemia.  Blood pressure noted to be poorly controlled.  Will adjust medication dosage.  Labetalol as needed.  Hyperlipidemia Continue Lipitor  Tobacco abuse Counseled.  Nicotine patch.  COPD Currently not in acute exacerbation.  Continue inhalers.  History of BPH Continue home medications.  DVT Prophylaxis: Subcutaneous heparin Code Status: Full code Family Communication: Discussed with the patient.  Cussed with his wife yesterday. Disposition Plan: Hopefully return home when improved.  PT and OT evaluation.  Home health recommended.  Status is: Inpatient  Remains inpatient appropriate because:IV treatments appropriate due to intensity of illness or inability to take PO and Inpatient level of care appropriate due to severity of illness   Dispo: The  patient is from: Home              Anticipated d/c is to: Home              Anticipated d/c date is: 1 day              Patient currently is not medically stable to d/c.         Medications:  Scheduled: . alfuzosin  10 mg Oral Q breakfast  . arformoterol  15 mcg Nebulization BID  . atorvastatin  40 mg Oral QHS  . azithromycin  500 mg Oral Daily  . cholecalciferol   1,000 Units Oral Daily  . heparin  5,000 Units Subcutaneous Q8H  . insulin aspart  0-5 Units Subcutaneous QHS  . insulin aspart  0-9 Units Subcutaneous TID WC  . insulin glargine  20 Units Subcutaneous QHS  . loratadine  10 mg Oral Daily  . metoprolol tartrate  25 mg Oral BID  . multivitamin with minerals  1 tablet Oral Daily  . nicotine  14 mg Transdermal Daily  . pantoprazole  40 mg Oral Daily  . senna-docusate  2 tablet Oral QHS  . tamsulosin  0.4 mg Oral BID  . umeclidinium bromide  1 puff Inhalation Daily   Continuous: . cefTRIAXone (ROCEPHIN)  IV 1 g (04/26/20 0532)  . lactated ringers 50 mL/hr at 04/25/20 1541   [id number] **OR** acetaminophen, fluticasone, HYDROcodone-acetaminophen, ipratropium-albuterol   Objective:  Vital Signs  Vitals:   04/25/20 1248 04/25/20 1939 04/25/20 2155 04/26/20 0543  BP: (!) 154/66  (!) 162/77 (!) 180/79  Pulse:  75 91 91  Resp: '11 14 18 19  ' Temp: 98.7 F (37.1 C)  98.6 F (37 C) 97.9 F (36.6 C)  TempSrc: Oral  Oral Oral  SpO2: 97% 97% 96% 94%    Intake/Output Summary (Last 24 hours) at 04/26/2020 1015 Last data filed at 04/26/2020 0500 Gross per 24 hour  Intake 240 ml  Output 1010 ml  Net -770 ml   There were no vitals filed for this visit.  General appearance: Awake alert.  In no distress Resp: No wheezing appreciated.  Few crackles at the bases.  No rhonchi. Cardio: S1-S2 is normal regular.  No S3-S4.  No rubs murmurs or bruit GI: Abdomen is soft.  Nontender nondistended.  Bowel sounds are present normal.  No masses organomegaly Extremities: No edema.  Full range of motion of lower extremities. Neurologic: No focal neurological deficits.      Lab Results:  Data Reviewed: I have personally reviewed following labs and imaging studies  CBC: Recent Labs  Lab 04/24/20 0044 04/25/20 0322 04/26/20 0337  WBC 24.5* 15.6* 10.8*  NEUTROABS 20.4*  --   --   HGB 12.9* 10.4* 11.5*  HCT 37.5* 31.3* 33.6*  MCV 88.9  89.2 87.7  PLT 256 206 638    Basic Metabolic Panel: Recent Labs  Lab 04/24/20 0044 04/25/20 0322 04/26/20 0337  NA 137 142 141  K 3.6 2.9* 3.4*  CL 101 103 104  CO2 '22 26 25  ' GLUCOSE 180* 119* 119*  BUN 13 9 6*  CREATININE 1.32* 1.18 1.24  CALCIUM 9.2 9.4 9.6  MG  --  1.6*  --     GFR: CrCl cannot be calculated (Unknown ideal weight.).  Liver Function Tests: Recent Labs  Lab 04/24/20 0044 04/25/20 0322  AST 14* 17  ALT 12 11  ALKPHOS 97 79  BILITOT 1.2 1.0  PROT 7.0 5.8*  ALBUMIN 3.2* 2.7*     Coagulation Profile: Recent Labs  Lab 04/24/20 0044  INR 1.2    Cardiac Enzymes: Recent Labs  Lab 04/24/20 1056  CKTOTAL 106     HbA1C: Recent Labs    04/24/20 0044  HGBA1C 6.7*    CBG: Recent Labs  Lab 04/25/20 0722 04/25/20 1223 04/25/20 1642 04/25/20 2147 04/26/20 0718  GLUCAP 125* 159* 126* 145* 119*      Recent Results (from the past 240 hour(s))  Blood Culture (routine x 2)     Status: None (Preliminary result)   Collection Time: 04/24/20 12:53 AM   Specimen: BLOOD LEFT HAND  Result Value Ref Range Status   Specimen Description BLOOD LEFT HAND  Final   Special Requests   Final    BOTTLES DRAWN AEROBIC AND ANAEROBIC Blood Culture adequate volume   Culture   Final    NO GROWTH 2 DAYS Performed at [facility], [place] [address] [place], [place] [address]    Report Status PENDING  Incomplete  Blood Culture (routine x 2)     Status: None (Preliminary result)   Collection Time: 04/24/20  1:50 AM   Specimen: BLOOD LEFT HAND  Result Value Ref Range Status   Specimen Description BLOOD LEFT HAND  Final   Special Requests   Final    BOTTLES DRAWN AEROBIC AND ANAEROBIC Blood Culture adequate volume   Culture   Final    NO GROWTH 2 DAYS Performed at [facility], [place] [address], [place], [place] [address]    Report Status PENDING  Incomplete  Urine culture     Status: Abnormal   Collection Time: 04/24/20  3:11 AM    Specimen: In/Out Cath Urine  Result Value Ref Range Status   Specimen Description IN/OUT CATH URINE  Final   Special Requests   Final    NONE Performed at [facility], [place] [address] [place], [place] [address]    Culture MULTIPLE SPECIES PRESENT, SUGGEST RECOLLECTION (A)  Final   Report Status 04/25/2020 FINAL  Final  Resp Panel by RT-PCR (Flu A&B, Covid) Nasopharyngeal Swab     Status: None   Collection Time: 04/24/20  8:36 AM   Specimen: Nasopharyngeal Swab; Nasopharyngeal(NP) swabs in vial transport medium  Result Value Ref Range Status   SARS Coronavirus 2 by RT PCR NEGATIVE NEGATIVE Final    Comment: (NOTE) SARS-CoV-2 target nucleic acids are NOT DETECTED.  The SARS-CoV-2 RNA is generally detectable in upper respiratory specimens during the acute phase of infection. The lowest concentration of SARS-CoV-2 viral copies this assay can detect is 138 copies/mL. A negative result does not preclude SARS-Cov-2 infection and should not be used as the sole basis for treatment or other patient management decisions. A negative result may occur with  improper specimen collection/handling, submission of specimen other than nasopharyngeal swab, presence of viral mutation(s) within the areas targeted by this assay, and inadequate number of viral copies(<138 copies/mL). A negative result must be combined with clinical observations, patient history, and epidemiological information. The expected result is Negative.  Fact Sheet for Patients:  EntrepreneurPulse.com.au  Fact Sheet for Healthcare Providers:  IncredibleEmployment.be  This test is no t yet approved or cleared by the [place] FDA and  has been authorized for detection and/or diagnosis of SARS-CoV-2 by FDA under an Emergency Use Authorization (EUA). This EUA will remain  in effect (meaning this test can be used) for the duration of the COVID-19 declaration  under Section 564(b)(1) of  the Act, 21 U.S.C.section 360bbb-3(b)(1), unless the authorization is terminated  or revoked sooner.       Influenza A by PCR NEGATIVE NEGATIVE Final   Influenza B by PCR NEGATIVE NEGATIVE Final    Comment: (NOTE) The Xpert Xpress SARS-CoV-2/FLU/RSV plus assay is intended as an aid in the diagnosis of influenza from Nasopharyngeal swab specimens and should not be used as a sole basis for treatment. Nasal washings and aspirates are unacceptable for Xpert Xpress SARS-CoV-2/FLU/RSV testing.  Fact Sheet for Patients: EntrepreneurPulse.com.au  Fact Sheet for Healthcare Providers: IncredibleEmployment.be  This test is not yet approved or cleared by the [place] FDA and has been authorized for detection and/or diagnosis of SARS-CoV-2 by FDA under an Emergency Use Authorization (EUA). This EUA will remain in effect (meaning this test can be used) for the duration of the COVID-19 declaration under Section 564(b)(1) of the Act, 21 U.S.C. section 360bbb-3(b)(1), unless the authorization is terminated or revoked.  Performed at [facility], [place] [address], [place], [place] [address]       Radiology Studies: DG Swallowing Func-Speech Pathology  Result Date: 04/25/2020 Objective Swallowing Evaluation: Type of Study: MBS-Modified Barium Swallow Study  Patient Details Name: [name] Parks MRN: [id number] Date of Birth: 29-Jun-1945 Today's Date: 04/25/2020 Time: SLP Start Time (ACUTE ONLY): 2585 -SLP Stop Time (ACUTE ONLY): 1134 SLP Time Calculation (min) (ACUTE ONLY): 15 min Past Medical History: Past Medical History: Diagnosis Date . CAP (community acquired pneumonia) 10/17/2013 . COPD (chronic obstructive pulmonary disease) ([place])  . Hyperlipidemia  . Hypertension  . Migraines   "long time ago; none lately" (10/17/2013) . OSA on CPAP  . PTSD (post-traumatic stress disorder)  . Shortness of breath dyspnea  . Type II diabetes mellitus ([place])  Past  Surgical History: Past Surgical History: Procedure Laterality Date . EXCISIONAL HEMORRHOIDECTOMY  1970's? [facility] JOINT REPLACEMENT   . LEFT HEART CATHETERIZATION WITH CORONARY ANGIOGRAM N/A 09/21/2014  Procedure: LEFT HEART CATHETERIZATION WITH CORONARY ANGIOGRAM;  Surgeon: Peter M Martinique, MD;  Location: [facility] CATH LAB;  Service: Cardiovascular;  Laterality: N/A; . TOTAL KNEE ARTHROPLASTY Right ~ 2012 . TOTAL KNEE ARTHROPLASTY Left ~ 2013 HPI: Pt is a 74 year old male with history of COPD, hypertension, hyperlipidemia, diabetes mellitus type 2 initially was brought to the ER via EMS as family noted the patient to be confused for the last 12 hours prior to admission. Pt was febrile at time of admission and met sepsis criteria at the time of admission with fevers, tachypnea, tachycardia, leukocytosis; source secondary to UTI but aspiration pneumonia was also considere by Dr. Tana Coast since pt mentioned that he has occasionally felt difficulty eating. CXR 12/1: Progressive interstitial changes likely related to the patient's known underlying emphysema. No radiographic evidence of acute cardiopulmonary disease. BSE 10/18/13: normal swallow function  No data recorded Assessment / Plan / Recommendation CHL IP CLINICAL IMPRESSIONS 04/25/2020 Clinical Impression Pt was seen in radiology suite for modified barium swallow study. Trials of puree solids, regular texture solids, a 11m barium tablet, and thin liquids via cup and straw were administered. Pt's oropharyngeal swallow mechanism was within functional limits. Transient penetration (PAS 2) was noted with consecutive swallows of thin liquids, but this is considered to be WNL and no other instances of laryngeal invasion were observed. It is recommended that a regular texture diet with thin liquids be continued at this time. Considering pt's presentation on 12/1, SLP will see patient once more to ensure tolerance of the diet;  however, further skilled SLP services will likely not be needed  beyond that.  SLP Visit Diagnosis Dysphagia, unspecified (R13.10) Attention and concentration deficit following -- Frontal lobe and executive function deficit following -- Impact on safety and function No limitations   CHL IP TREATMENT RECOMMENDATION 04/25/2020 Treatment Recommendations Therapy as outlined in treatment plan below   No flowsheet data found. CHL IP DIET RECOMMENDATION 04/25/2020 SLP Diet Recommendations Regular solids;Thin liquid Liquid Administration via Cup;Straw Medication Administration Whole meds with liquid Compensations Slow rate;Small sips/bites Postural Changes Remain semi-upright after after feeds/meals (Comment)   No flowsheet data found.  CHL IP FOLLOW UP RECOMMENDATIONS 04/25/2020 Follow up Recommendations None   CHL IP FREQUENCY AND DURATION 04/25/2020 Speech Therapy Frequency (ACUTE ONLY) min 1 x/week Treatment Duration 1 week      CHL IP ORAL PHASE 04/25/2020 Oral Phase WFL Oral - Pudding Teaspoon -- Oral - Pudding Cup -- Oral - Honey Teaspoon -- Oral - Honey Cup -- Oral - Nectar Teaspoon -- Oral - Nectar Cup -- Oral - Nectar Straw -- Oral - Thin Teaspoon -- Oral - Thin Cup -- Oral - Thin Straw -- Oral - Puree -- Oral - Mech Soft -- Oral - Regular -- Oral - Multi-Consistency -- Oral - Pill -- Oral Phase - Comment --  CHL IP PHARYNGEAL PHASE 04/25/2020 Pharyngeal Phase WFL Pharyngeal- Pudding Teaspoon -- Pharyngeal -- Pharyngeal- Pudding Cup -- Pharyngeal -- Pharyngeal- Honey Teaspoon -- Pharyngeal -- Pharyngeal- Honey Cup -- Pharyngeal -- Pharyngeal- Nectar Teaspoon -- Pharyngeal -- Pharyngeal- Nectar Cup -- Pharyngeal -- Pharyngeal- Nectar Straw -- Pharyngeal -- Pharyngeal- Thin Teaspoon -- Pharyngeal -- Pharyngeal- Thin Cup -- Pharyngeal -- Pharyngeal- Thin Straw -- Pharyngeal -- Pharyngeal- Puree -- Pharyngeal -- Pharyngeal- Mechanical Soft -- Pharyngeal -- Pharyngeal- Regular -- Pharyngeal -- Pharyngeal- Multi-consistency -- Pharyngeal -- Pharyngeal- Pill -- Pharyngeal -- Pharyngeal  Comment --  CHL IP CERVICAL ESOPHAGEAL PHASE 04/25/2020 Cervical Esophageal Phase WFL Pudding Teaspoon -- Pudding Cup -- Honey Teaspoon -- Honey Cup -- Nectar Teaspoon -- Nectar Cup -- Nectar Straw -- Thin Teaspoon -- Thin Cup -- Thin Straw -- Puree -- Mechanical Soft -- Regular -- Multi-consistency -- Pill -- Cervical Esophageal Comment -- Shanika I. Hardin Negus, [place], [place] Office number [phone] Pager [place] 04/25/2020, 12:38 PM                  LOS: 1 day   [facility] Pager on [email or web address]  04/26/2020, 10:15 AM

## 2020-04-26 NOTE — Evaluation (Signed)
Physical Therapy Evaluation Patient Details Name: [name] Parks MRN: [id number] DOB: 1945/06/29 Today's Date: 04/26/2020   History of Present Illness  74 yo admitted 12/1 with AMS found to have sepsis due to UTI with encephalopathy. PMhx: COPD, HTN, HLD, DM, PTSD  Clinical Impression  Pt presents with decreased endurance with gait and stairs and an increased need for assistance for getting EOB. Pt states that at baseline [place] with [place] with only occasional need for rest breaks. Pt was able to maintain sats during gait and relied on 1 person hand held assist. Pt however reported fatigue with the community ambulation distances that he performed today and required standing rest breaks. Pt would benefit from acute skilled therapy, and at d/c home health, in order to increase endurance with gait and stairs in order to reintegrate into the community.    Follow Up Recommendations Home health PT    Equipment Recommendations  None recommended by PT    Recommendations for Other Services       Precautions / Restrictions Precautions Precautions: Fall Restrictions Weight Bearing Restrictions: No      Mobility  Bed Mobility Overal bed mobility: Needs Assistance             General bed mobility comments: 1 person hand held mod assist for anterior trunk translation in supine to sit    Transfers Overall transfer level: Needs assistance   Transfers: Sit to/from Stand Sit to Stand: Min guard         General transfer comment: guarding for lines and safety  Ambulation/Gait Ambulation/Gait assistance: Min assist Gait Distance (Feet): 300 Feet Assistive device: 1 person hand held assist Gait Pattern/deviations: Step-through pattern;Decreased stride length   Gait velocity interpretation: <1.8 ft/sec, indicate of risk for recurrent falls General Gait Details: slow cautious gait with HHA since cane not present. 3 standing rest breaks with SpO2 95% on RA HR  112 with gait. pt with 2/4 DOE with gait  Stairs Stairs: Yes Stairs assistance: Min guard Stair Management: Step to pattern;Forwards;Alternating pattern Number of Stairs: 4 General stair comments: HHA and hand rail with guarding for lines and safety  Wheelchair Mobility    Modified Rankin (Stroke Patients Only)       Balance Overall balance assessment: Needs assistance Sitting-balance support: Bilateral upper extremity supported;Feet supported;No upper extremity supported Sitting balance-Leahy Scale: Fair       Standing balance-Leahy Scale: Fair Standing balance comment: pt able to stand at EOB with UE support for gait                             Pertinent Vitals/Pain Pain Assessment: No/denies pain    Home Living Family/patient expects to be discharged to:: Private residence Living Arrangements: Spouse/significant other Available Help at Discharge: Family Type of Home: House (trailer house) Home Access: Stairs to enter Entrance Stairs-Rails: Can reach both Secretary/administrator of Steps: 5 Home Layout: One level Home Equipment: Shower seat - built in;Cane - single point;Other (comment) (rollator)      Prior Function Level of Independence: Independent with assistive device(s)         Comments: drives, wife does the cooking and cleaning     Hand Dominance        Extremity/Trunk Assessment                Communication   Communication: No difficulties  Cognition Arousal/Alertness: Awake/alert Behavior During Therapy: WFL for  tasks assessed/performed Overall Cognitive Status: Within Functional Limits for tasks assessed                                        General Comments      Exercises     Assessment/Plan    PT Assessment Patient needs continued PT services  PT Problem List Decreased strength;Decreased mobility;Decreased safety awareness;Decreased activity tolerance;Decreased balance;Decreased knowledge of  use of DME       PT Treatment Interventions DME instruction;Therapeutic exercise;Gait training;Balance training;Stair training;Functional mobility training;Patient/family education;Therapeutic activities;Cognitive remediation    PT Goals (Current goals can be found in the Care Plan section)  Acute Rehab PT Goals Patient Stated Goal: return home PT Goal Formulation: With patient Time For Goal Achievement: 05/10/20 Potential to Achieve Goals: Good    Frequency Min 3X/week   Barriers to discharge        Co-evaluation               AM-PAC PT "6 Clicks" Mobility  Outcome Measure Help needed turning from your back to your side while in a flat bed without using bedrails?: A Little Help needed moving from lying on your back to sitting on the side of a flat bed without using bedrails?: A Little Help needed moving to and from a bed to a chair (including a wheelchair)?: A Little Help needed standing up from a chair using your arms (e.g., wheelchair or bedside chair)?: A Little Help needed to walk in hospital room?: A Little Help needed climbing 3-5 steps with a railing? : A Little 6 Click Score: 18    End of Session Equipment Utilized During Treatment: Gait belt Activity Tolerance: Patient tolerated treatment well Patient left: in chair;with call bell/phone within reach;with chair alarm set Nurse Communication: Mobility status PT Visit Diagnosis: Other abnormalities of gait and mobility (R26.89);Difficulty in walking, not elsewhere classified (R26.2);History of falling (Z91.81)    Time: 7672-0947 PT Time Calculation (min) (ACUTE ONLY): 31 min   Charges:   PT Evaluation $PT Eval Moderate Complexity: 1 Mod PT Treatments $Gait Training: 8-22 mins        Jeri Cos, SPT [id number]  [name] 04/26/2020, 10:07 AM

## 2020-04-26 NOTE — Progress Notes (Signed)
SLP Cancellation Note  Patient Details Name: [name] Parks MRN: [id number] DOB: 02-Nov-1945   Cancelled treatment:       Reason Eval/Treat Not Completed: Patient declined, no reason specified (Pt stated that he has been feeling nauseated and delined p.o. intake. SLP will f/u next week.)  Shamieka Gullo I. Vear Clock, MS, CCC-SLP Acute Rehabilitation Services Office number [phone] Pager [phone]  Scheryl Marten 04/26/2020, 5:00 PM

## 2020-04-26 NOTE — Evaluation (Signed)
Occupational Therapy Evaluation Patient Details Name: [name] Parks MRN: [id number] DOB: [birth year] Today's Date: 04/26/2020    History of Present Illness 74 yo admitted 12/1 with AMS found to have sepsis due to UTI with encephalopathy. PMhx: COPD, HTN, HLD, DM, PTSD   Clinical Impression   PTA patient reports independent with ADLs, mobility and driving. Admitted for above and limited by problem list below, including weakness, decreased activity tolerance and impaired cognition.  Noted impaired cognition today, and questionable historian.  Patient initially unable to recall birthday (only month), voices "I'm not in the hospital" when reoriented.  He follows simple commands with increased time and multimodal cueing, but no awareness of bowel incontinence in bed.  Patient requires total assist for toileting and min assist for rolling for pad change, RN present and assisting. Patient with limited engagement and reports "not feeling well". He will benefit from continued OT services while admitted and after dc at [facility] level, given 24/7 support, to optimize independence and return to PLOF.  If patient does not progress, he may need SNF.  Will follow acutely.     Follow Up Recommendations  Home health OT;Supervision/Assistance - 24 hour (pending progress)    Equipment Recommendations  3 in 1 bedside commode    Recommendations for Other Services       Precautions / Restrictions Precautions Precautions: Fall Restrictions Weight Bearing Restrictions: No      Mobility Bed Mobility Overal bed mobility: Needs Assistance Bed Mobility: Rolling Rolling: Min assist         General bed mobility comments: min assist rolling in bed for hygiene and changing pads after bowel incontience     Transfers                 General transfer comment: deferred, pt declined    Balance                                           ADL either performed or assessed with clinical  judgement   ADL Overall ADL's : Needs assistance/impaired     Grooming: Bed level;Minimal assistance                       Toileting- Clothing Manipulation and Hygiene: Total assistance;Bed level Toileting - Clothing Manipulation Details (indicate cue type and reason): total assist for hygiene after incontinent BM      Functional mobility during ADLs: Minimal assistance (limited bed level ) General ADL Comments: pt limited by cognition, weakness, fatigue      Vision         Perception     Praxis      Pertinent Vitals/Pain Pain Assessment: No/denies pain     Hand Dominance     Extremity/Trunk Assessment Upper Extremity Assessment Upper Extremity Assessment: Overall WFL for tasks assessed           Communication Communication Communication: No difficulties   Cognition Arousal/Alertness: Lethargic Behavior During Therapy: Flat affect Overall Cognitive Status: Impaired/Different from baseline Area of Impairment: Orientation;Following commands;Awareness;Problem solving                 Orientation Level: Disoriented to;Place;Situation     Following Commands: Follows one step commands inconsistently;Follows one step commands with increased time   Awareness: Intellectual Problem Solving: Slow processing;Decreased initiation;Difficulty sequencing;Requires verbal cues;Requires tactile cues General Comments: patient disoriented to birthday, place initally  reporting "feburary I can't remember" and "I'm not in the hospital" but correctly chooses 2021 with 2 choices.  He is soiled with BM upon entry and requires mulitmodal cueing to follow simple commands. Lethargic and reports not feeling well.  at completion of session patient able to voice birthday with no issue.   General Comments  question cognition    Exercises     Shoulder Instructions      Home Living Family/patient expects to be discharged to:: Private residence Living Arrangements:  Spouse/significant other Available Help at Discharge: Family Type of Home: Mobile home Home Access: Stairs to enter Entrance Stairs-Number of Steps: 5 Entrance Stairs-Rails: Can reach both Home Layout: One level     Bathroom Shower/Tub: Walk-in shower;Tub/shower unit   Bathroom Toilet: Handicapped height     Home Equipment: Shower seat - built in;Cane - single point;Other (comment) (rollator)          Prior Functioning/Environment Level of Independence: Independent with assistive device(s)        Comments: drives, wife does the cooking and cleaning        OT Problem List: Decreased strength;Decreased activity tolerance;Impaired balance (sitting and/or standing)      OT Treatment/Interventions: Self-care/ADL training;Therapeutic exercise;DME and/or AE instruction;Therapeutic activities;Cognitive remediation/compensation;Balance training;Patient/family education    OT Goals(Current goals can be found in the care plan section) Acute Rehab OT Goals Patient Stated Goal: none stated Time For Goal Achievement: 05/10/20 Potential to Achieve Goals: Good  OT Frequency: Min 2X/week   Barriers to D/C:            Co-evaluation              AM-PAC OT "6 Clicks" Daily Activity     Outcome Measure Help from another person eating meals?: A Little Help from another person taking care of personal grooming?: A Little Help from another person toileting, which includes using toliet, bedpan, or urinal?: Total Help from another person bathing (including washing, rinsing, drying)?: A Lot Help from another person to put on and taking off regular upper body clothing?: A Lot Help from another person to put on and taking off regular lower body clothing?: Total 6 Click Score: 12   End of Session Nurse Communication: Mobility status;Other (comment) (confusion)  Activity Tolerance: Patient tolerated treatment well Patient left: in bed;with call bell/phone within reach;with bed alarm  set  OT Visit Diagnosis: Other abnormalities of gait and mobility (R26.89);Other symptoms and signs involving cognitive function                Time: 1050-1107 OT Time Calculation (min): 17 min Charges:  OT General Charges $OT Visit: 1 Visit OT Evaluation $OT Eval Moderate Complexity: 1 Mod  Barry Brunner, OT Acute Rehabilitation Services Pager [phone] Office [phone]   Chancy Milroy 04/26/2020, 12:36 PM

## 2020-04-27 LAB — BASIC METABOLIC PANEL
Anion gap: 12 (ref 5–15)
BUN: 5 mg/dL — ABNORMAL LOW (ref 8–23)
CO2: 23 mmol/L (ref 22–32)
Calcium: 9.5 mg/dL (ref 8.9–10.3)
Chloride: 104 mmol/L (ref 98–111)
Creatinine, Ser: 1.13 mg/dL (ref 0.61–1.24)
GFR, Estimated: >60 mL/min (ref >60)
Glucose, Bld: 113 mg/dL — ABNORMAL HIGH (ref 70–99)
Potassium: 3.4 mmol/L — ABNORMAL LOW (ref 3.5–5.1)
Sodium: 139 mmol/L (ref 135–145)

## 2020-04-27 LAB — MAGNESIUM: Magnesium: 1.8 mg/dL (ref 1.7–2.4)

## 2020-04-27 LAB — CBC
HCT: 32.1 % — ABNORMAL LOW (ref 39.0–52.0)
Hemoglobin: 11.2 g/dL — ABNORMAL LOW (ref 13.0–17.0)
MCH: 30.2 pg (ref 26.0–34.0)
MCHC: 34.9 g/dL (ref 30.0–36.0)
MCV: 86.5 fL (ref 80.0–100.0)
Platelets: 254 10*3/uL (ref 150–400)
RBC: 3.71 MIL/uL — ABNORMAL LOW (ref 4.22–5.81)
RDW: 14 % (ref 11.5–15.5)
WBC: 7.5 10*3/uL (ref 4.0–10.5)
nRBC: 0 % (ref 0.0–0.2)

## 2020-04-27 LAB — GLUCOSE, CAPILLARY: Glucose-Capillary: 97 mg/dL (ref 70–99)

## 2020-04-27 MED ORDER — CEPHALEXIN 500 MG PO CAPS: 500.0000 mg | ORAL_CAPSULE | Freq: Three times a day (TID) | ORAL | 0 refills | Status: AC

## 2020-04-27 MED ORDER — AMLODIPINE BESYLATE 5 MG PO TABS: 5.0000 mg | ORAL_TABLET | Freq: Every day | ORAL | 1 refills | Status: AC

## 2020-04-27 MED ORDER — CEPHALEXIN 500 MG PO CAPS
500.0000 mg | ORAL_CAPSULE | Freq: Three times a day (TID) | ORAL | Status: DC
  Administered 2020-04-27: 500 mg via ORAL
  Filled 2020-04-27: qty 1

## 2020-04-27 MED ORDER — METOPROLOL TARTRATE 50 MG PO TABS: 50.0000 mg | ORAL_TABLET | Freq: Two times a day (BID) | ORAL | 1 refills | Status: AC

## 2020-04-27 MED ORDER — POTASSIUM CHLORIDE CRYS ER 20 MEQ PO TBCR
40.0000 meq | EXTENDED_RELEASE_TABLET | Freq: Once | ORAL | Status: AC
  Administered 2020-04-27: 40 meq via ORAL
  Filled 2020-04-27: qty 2

## 2020-04-27 MED ORDER — AZITHROMYCIN 500 MG PO TABS: 500.0000 mg | ORAL_TABLET | Freq: Every day | ORAL | 0 refills | Status: AC

## 2020-04-27 NOTE — Plan of Care (Signed)

## 2020-04-27 NOTE — Plan of Care (Signed)
  Problem: Urinary Elimination: Goal: Signs and symptoms of infection will decrease Outcome: Adequate for Discharge   Problem: Education: Goal: Knowledge of General Education information will improve Description: Including pain rating scale, medication(s)/side effects and non-pharmacologic comfort measures Outcome: Adequate for Discharge   Problem: Health Behavior/Discharge Planning: Goal: Ability to manage health-related needs will improve Outcome: Adequate for Discharge   Problem: Clinical Measurements: Goal: Ability to maintain clinical measurements within normal limits will improve Outcome: Adequate for Discharge Goal: Will remain free from infection Outcome: Adequate for Discharge Goal: Diagnostic test results will improve Outcome: Adequate for Discharge Goal: Respiratory complications will improve Outcome: Adequate for Discharge Goal: Cardiovascular complication will be avoided Outcome: Adequate for Discharge   Problem: Activity: Goal: Risk for activity intolerance will decrease Outcome: Adequate for Discharge   Problem: Nutrition: Goal: Adequate nutrition will be maintained Outcome: Adequate for Discharge   Problem: Coping: Goal: Level of anxiety will decrease Outcome: Adequate for Discharge   Problem: Elimination: Goal: Will not experience complications related to bowel motility Outcome: Adequate for Discharge Goal: Will not experience complications related to urinary retention Outcome: Adequate for Discharge   Problem: Pain Managment: Goal: General experience of comfort will improve Outcome: Adequate for Discharge   Problem: Safety: Goal: Ability to remain free from injury will improve Outcome: Adequate for Discharge   Problem: Skin Integrity: Goal: Risk for impaired skin integrity will decrease Outcome: Adequate for Discharge   

## 2020-04-27 NOTE — Discharge Summary (Signed)
[facility]  Physician Discharge Summary   Patient ID: [name] Parks MRN: [id number] DOB/AGE: 1945-08-05 74 y.o.  Admit date: 04/24/2020 Discharge date: 04/27/2020  PCP: [name], Niger, MD  DISCHARGE DIAGNOSES:  Urinary tract infection with sepsis present on admission Aspiration versus community-acquired pneumonia Acute metabolic encephalopathy, resolved Diabetes mellitus type 2, controlled Hypokalemia Essential hypertension   RECOMMENDATIONS FOR OUTPATIENT FOLLOW UP: 1. Patient to follow-up with his PCP in 1 week 2. Chlorthalidone discontinued.  Amlodipine added.  Dose of metoprolol increased.   Home Health: PT and OT Equipment/Devices: 3n 1   CODE STATUS: Full code  DISCHARGE CONDITION: fair  Diet recommendation: Modified carbohydrate  INITIAL HISTORY: 74 year old male with history of COPD, hypertension, hyperlipidemia, diabetes mellitus type 2 initially was brought to the ER via EMS as family noted the patient to be confused for the last 12 hours prior to admission.   Patient mention noticing blood in the urine with increased urgency and frequency.  Also mention fever and chills.  Loss of appetite.  Hospitalized for sepsis due to UTI.     HOSPITAL COURSE:   Urinary tract infection with sepsis present on admission/concern for aspiration versus community-acquired pneumonia Patient met sepsis criteria at the time of admission.  Patient was started on ceftriaxone and azithromycin.  Multiple species noted on urine culture.  Another urine sample was sent for culture which also did not show any growth.  WBC continues to improve.  He started feeling better.  Procalcitonin was 0.55.  Blood cultures were negative.  Patient transitioned to Keflex and oral azithromycin. Patient was also seen by speech therapy.  Underwent swallow evaluation.  No significant concern for aspiration noted.  Cough is better.  He was told to stop smoking. Otherwise stable from a respiratory  standpoint with saturations normal on room air.  Acute metabolic encephalopathy Noted to be confused per family members. Likely due to infection.   Mentation seems to be back to baseline.  No focal deficits.    Hypokalemia and hypomagnesemia Supplemented.    Diabetes mellitus type 2, controlled Continue home medications  Essential hypertension Blood pressure noted to be poorly controlled.  Dose of metoprolol increased.  Amlodipine was added.  Chlorthalidone discontinued due to hypokalemia.    Hyperlipidemia Continue Lipitor  Tobacco abuse Counseled.    COPD Currently not in acute exacerbation.  Continue inhalers.  History of BPH Continue home medications.  Noted to be on both alfuzosin as well as tamsulosin.  Patient told that he needs to take only 1 of these medication unless specifically instructed to do so by his outpatient providers.   Patient is stable.  Feels better.  Okay for discharge home.  Discussed with his wife.   PERTINENT LABS:  The results of significant diagnostics from this hospitalization (including imaging, microbiology, ancillary and laboratory) are listed below for reference.    Microbiology: Recent Results (from the past 240 hour(s))  Blood Culture (routine x 2)     Status: None (Preliminary result)   Collection Time: 04/24/20 12:53 AM   Specimen: BLOOD LEFT HAND  Result Value Ref Range Status   Specimen Description BLOOD LEFT HAND  Final   Special Requests   Final    BOTTLES DRAWN AEROBIC AND ANAEROBIC Blood Culture adequate volume   Culture   Final    NO GROWTH 3 DAYS Performed at [facility], [address] [place], [place] [address]    Report Status PENDING  Incomplete  Blood Culture (routine x 2)  Status: None (Preliminary result)   Collection Time: 04/24/20  1:50 AM   Specimen: BLOOD LEFT HAND  Result Value Ref Range Status   Specimen Description BLOOD LEFT HAND  Final   Special Requests   Final    BOTTLES DRAWN  AEROBIC AND ANAEROBIC Blood Culture adequate volume   Culture   Final    NO GROWTH 3 DAYS Performed at [facility], [address] [place], [place] [address]    Report Status PENDING  Incomplete  Urine culture     Status: Abnormal   Collection Time: 04/24/20  3:11 AM   Specimen: In/Out Cath Urine  Result Value Ref Range Status   Specimen Description IN/OUT CATH URINE  Final   Special Requests   Final    NONE Performed at [facility], [place] [address] [place], [place] [address]    Culture MULTIPLE SPECIES PRESENT, SUGGEST RECOLLECTION (A)  Final   Report Status 04/25/2020 FINAL  Final  Resp Panel by RT-PCR (Flu A&B, Covid) Nasopharyngeal Swab     Status: None   Collection Time: 04/24/20  8:36 AM   Specimen: Nasopharyngeal Swab; Nasopharyngeal(NP) swabs in vial transport medium  Result Value Ref Range Status   SARS Coronavirus 2 by RT PCR NEGATIVE NEGATIVE Final    Comment: (NOTE) SARS-CoV-2 target nucleic acids are NOT DETECTED.  The SARS-CoV-2 RNA is generally detectable in upper respiratory specimens during the acute phase of infection. The lowest concentration of SARS-CoV-2 viral copies this assay can detect is 138 copies/mL. A negative result does not preclude SARS-Cov-2 infection and should not be used as the sole basis for treatment or other patient management decisions. A negative result may occur with  improper specimen collection/handling, submission of specimen other than nasopharyngeal swab, presence of viral mutation(s) within the areas targeted by this assay, and inadequate number of viral copies(<138 copies/mL). A negative result must be combined with clinical observations, patient history, and epidemiological information. The expected result is Negative.  Fact Sheet for Patients:  EntrepreneurPulse.com.au  Fact Sheet for Healthcare Providers:  IncredibleEmployment.be  This test is no t yet approved or cleared  by the [place] FDA and  has been authorized for detection and/or diagnosis of SARS-CoV-2 by FDA under an Emergency Use Authorization (EUA). This EUA will remain  in effect (meaning this test can be used) for the duration of the COVID-19 declaration under Section 564(b)(1) of the Act, 21 U.S.C.section 360bbb-3(b)(1), unless the authorization is terminated  or revoked sooner.       Influenza A by PCR NEGATIVE NEGATIVE Final   Influenza B by PCR NEGATIVE NEGATIVE Final    Comment: (NOTE) The Xpert Xpress SARS-CoV-2/FLU/RSV plus assay is intended as an aid in the diagnosis of influenza from Nasopharyngeal swab specimens and should not be used as a sole basis for treatment. Nasal washings and aspirates are unacceptable for Xpert Xpress SARS-CoV-2/FLU/RSV testing.  Fact Sheet for Patients: EntrepreneurPulse.com.au  Fact Sheet for Healthcare Providers: IncredibleEmployment.be  This test is not yet approved or cleared by the [place] FDA and has been authorized for detection and/or diagnosis of SARS-CoV-2 by FDA under an Emergency Use Authorization (EUA). This EUA will remain in effect (meaning this test can be used) for the duration of the COVID-19 declaration under Section 564(b)(1) of the Act, 21 U.S.C. section 360bbb-3(b)(1), unless the authorization is terminated or revoked.  Performed at [facility], [place] [address] [place], [place] [address]   Culture, Urine  Status: Abnormal   Collection Time: 04/25/20 10:02 AM   Specimen: Urine, Random  Result Value Ref Range Status   Specimen Description URINE, RANDOM  Final   Special Requests NONE  Final   Culture (A)  Final    <10,000 COLONIES/mL INSIGNIFICANT GROWTH Performed at [facility], [address], [place], [place] [address]    Report Status 04/26/2020 FINAL  Final     Labs:    Basic Metabolic Panel: Recent Labs  Lab 04/24/20 0044 04/25/20 0322  04/26/20 0337 04/27/20 0123  NA 137 142 141 139  K 3.6 2.9* 3.4* 3.4*  CL 101 103 104 104  CO2 _0 GLUCOSE 180* 119* 119* 113*  BUN 13 9 6* 5*  CREATININE 1.32* 1.18 1.24 1.13  CALCIUM 9.2 9.4 9.6 9.5  MG  --  1.6*  --  1.8   Liver Function Tests: Recent Labs  Lab 04/24/20 0044 04/25/20 0322  AST 14* 17  ALT 12 11  ALKPHOS 97 79  BILITOT 1.2 1.0  PROT 7.0 5.8*  ALBUMIN 3.2* 2.7*   CBC: Recent Labs  Lab 04/24/20 0044 04/25/20 0322 04/26/20 0337 04/27/20 0123  WBC 24.5* 15.6* 10.8* 7.5  NEUTROABS 20.4*  --   --   --   HGB 12.9* 10.4* 11.5* 11.2*  HCT 37.5* 31.3* 33.6* 32.1*  MCV 88.9 89.2 87.7 86.5  PLT 256 206 239 254   Cardiac Enzymes: Recent Labs  Lab 04/24/20 1056  CKTOTAL 106   CBG: Recent Labs  Lab 04/26/20 0718 04/26/20 1210 04/26/20 1539 04/26/20 2052 04/27/20 0821  GLUCAP 119* 127* 117* 101* 97     IMAGING STUDIES DG Chest Port 1 View  Result Date: 04/24/2020 CLINICAL DATA:  Sepsis EXAM: PORTABLE CHEST 1 VIEW COMPARISON:  02/24/2018 FINDINGS: Interstitial coarsening has progressed since prior examination likely reflecting the patient's underlying emphysema. The lungs are otherwise clear. No pneumothorax or pleural effusion. Cardiac size within normal limits. No acute bone abnormality. IMPRESSION: Progressive interstitial changes likely related to the patient's known underlying emphysema. No radiographic evidence of acute cardiopulmonary disease. Electronically Signed   By: Fidela Salisbury MD   On: 04/24/2020 00:59   DG Swallowing Func-Speech Pathology  Result Date: 04/25/2020 Objective Swallowing Evaluation: Type of Study: MBS-Modified Barium Swallow Study  Patient Details Name: Ariq Khamis MRN: [id number] Date of Birth: 04/25/1946 Today's Date: 04/25/2020 Time: SLP Start Time (ACUTE ONLY): 3244 -SLP Stop Time (ACUTE ONLY): 1134 SLP Time Calculation (min) (ACUTE ONLY): 15 min Past Medical History: Past Medical History: Diagnosis Date .  CAP (community acquired pneumonia) 10/17/2013 . COPD (chronic obstructive pulmonary disease) ([place])  . Hyperlipidemia  . Hypertension  . Migraines   "long time ago; none lately" (10/17/2013) . OSA on CPAP  . PTSD (post-traumatic stress disorder)  . Shortness of breath dyspnea  . Type II diabetes mellitus ([place])  Past Surgical History: Past Surgical History: Procedure Laterality Date . EXCISIONAL HEMORRHOIDECTOMY  1970's? [facility] Kitchen JOINT REPLACEMENT   . LEFT HEART CATHETERIZATION WITH CORONARY ANGIOGRAM N/A 09/21/2014  Procedure: LEFT HEART CATHETERIZATION WITH CORONARY ANGIOGRAM;  Surgeon: Peter M Martinique, MD;  Location: [facility] CATH LAB;  Service: Cardiovascular;  Laterality: N/A; . TOTAL KNEE ARTHROPLASTY Right ~ 2012 . TOTAL KNEE ARTHROPLASTY Left ~ 2013 HPI: Pt is a 74 year old male with history of COPD, hypertension, hyperlipidemia, diabetes mellitus type 2 initially was brought to the ER via EMS as family noted the patient to be confused for the last 12 hours prior  to admission. Pt was febrile at time of admission and met sepsis criteria at the time of admission with fevers, tachypnea, tachycardia, leukocytosis; source secondary to UTI but aspiration pneumonia was also considere by Dr. Tana Coast since pt mentioned that he has occasionally felt difficulty eating. CXR 12/1: Progressive interstitial changes likely related to the patient's known underlying emphysema. No radiographic evidence of acute cardiopulmonary disease. BSE 10/18/13: normal swallow function  No data recorded Assessment / Plan / Recommendation CHL IP CLINICAL IMPRESSIONS 04/25/2020 Clinical Impression Pt was seen in radiology suite for modified barium swallow study. Trials of puree solids, regular texture solids, a 61m barium tablet, and thin liquids via cup and straw were administered. Pt's oropharyngeal swallow mechanism was within functional limits. Transient penetration (PAS 2) was noted with consecutive swallows of thin liquids, but this is considered to be WNL  and no other instances of laryngeal invasion were observed. It is recommended that a regular texture diet with thin liquids be continued at this time. Considering pt's presentation on 12/1, SLP will see patient once more to ensure tolerance of the diet; however, further skilled SLP services will likely not be needed beyond that.  SLP Visit Diagnosis Dysphagia, unspecified (R13.10) Attention and concentration deficit following -- Frontal lobe and executive function deficit following -- Impact on safety and function No limitations   CHL IP TREATMENT RECOMMENDATION 04/25/2020 Treatment Recommendations Therapy as outlined in treatment plan below   No flowsheet data found. CHL IP DIET RECOMMENDATION 04/25/2020 SLP Diet Recommendations Regular solids;Thin liquid Liquid Administration via Cup;Straw Medication Administration Whole meds with liquid Compensations Slow rate;Small sips/bites Postural Changes Remain semi-upright after after feeds/meals (Comment)   No flowsheet data found.  CHL IP FOLLOW UP RECOMMENDATIONS 04/25/2020 Follow up Recommendations None   CHL IP FREQUENCY AND DURATION 04/25/2020 Speech Therapy Frequency (ACUTE ONLY) min 1 x/week Treatment Duration 1 week      CHL IP ORAL PHASE 04/25/2020 Oral Phase WFL Oral - Pudding Teaspoon -- Oral - Pudding Cup -- Oral - Honey Teaspoon -- Oral - Honey Cup -- Oral - Nectar Teaspoon -- Oral - Nectar Cup -- Oral - Nectar Straw -- Oral - Thin Teaspoon -- Oral - Thin Cup -- Oral - Thin Straw -- Oral - Puree -- Oral - Mech Soft -- Oral - Regular -- Oral - Multi-Consistency -- Oral - Pill -- Oral Phase - Comment --  CHL IP PHARYNGEAL PHASE 04/25/2020 Pharyngeal Phase WFL Pharyngeal- Pudding Teaspoon -- Pharyngeal -- Pharyngeal- Pudding Cup -- Pharyngeal -- Pharyngeal- Honey Teaspoon -- Pharyngeal -- Pharyngeal- Honey Cup -- Pharyngeal -- Pharyngeal- Nectar Teaspoon -- Pharyngeal -- Pharyngeal- Nectar Cup -- Pharyngeal -- Pharyngeal- Nectar Straw -- Pharyngeal -- Pharyngeal- Thin  Teaspoon -- Pharyngeal -- Pharyngeal- Thin Cup -- Pharyngeal -- Pharyngeal- Thin Straw -- Pharyngeal -- Pharyngeal- Puree -- Pharyngeal -- Pharyngeal- Mechanical Soft -- Pharyngeal -- Pharyngeal- Regular -- Pharyngeal -- Pharyngeal- Multi-consistency -- Pharyngeal -- Pharyngeal- Pill -- Pharyngeal -- Pharyngeal Comment --  CHL IP CERVICAL ESOPHAGEAL PHASE 04/25/2020 Cervical Esophageal Phase WFL Pudding Teaspoon -- Pudding Cup -- Honey Teaspoon -- Honey Cup -- Nectar Teaspoon -- Nectar Cup -- Nectar Straw -- Thin Teaspoon -- Thin Cup -- Thin Straw -- Puree -- Mechanical Soft -- Regular -- Multi-consistency -- Pill -- Cervical Esophageal Comment -- Shanika I. PHardin Negus MAngels CSan MateoOffice number [phone] [email or web address]/06/2019, 12:38 PM               DISCHARGE EXAMINATION: Vitals:  04/26/20 1523 04/26/20 2054 04/27/20 0558 04/27/20 0743  BP: (!) 171/79 (!) 163/90 (!) 167/66   Pulse: 81 87 76 89  Resp: _0 Temp: 98.1 F (36.7 C) 98.1 F (36.7 C) 98.6 F (37 C)   TempSrc:  Oral Oral   SpO2: 100% 100% 96% 98%   General appearance: Awake alert.  In no distress Resp: Clear to auscultation bilaterally.  Normal effort Cardio: S1-S2 is normal regular.  No S3-S4.  No rubs murmurs or bruit GI: Abdomen is soft.  Nontender nondistended.  Bowel sounds are present normal.  No masses organomegaly   DISPOSITION: Home  Discharge Instructions    Call MD for:  difficulty breathing, headache or visual disturbances   Complete by: As directed    Call MD for:  extreme fatigue   Complete by: As directed    Call MD for:  persistant dizziness or light-headedness   Complete by: As directed    Call MD for:  persistant nausea and vomiting   Complete by: As directed    Call MD for:  severe uncontrolled pain   Complete by: As directed    Call MD for:  temperature >100.4   Complete by: As directed    Diet Carb Modified   Complete by: As  directed    Discharge instructions   Complete by: As directed    Please take your medications as prescribed.  Seek attention if your symptoms were to get worse.  Home health has been recommended and has been ordered.  Please see your primary care provider in 1 week.  You were cared for by a hospitalist during your hospital stay. If you have any questions about your discharge medications or the care you received while you were in the hospital after you are discharged, you can call the unit and asked to speak with the hospitalist on call if the hospitalist that took care of you is not available. Once you are discharged, your primary care physician will handle any further medical issues. Please note that NO REFILLS for any discharge medications will be authorized once you are discharged, as it is imperative that you return to your primary care physician (or establish a relationship with a primary care physician if you do not have one) for your aftercare needs so that they can reassess your need for medications and monitor your lab values. If you do not have a primary care physician, you can call [phone] for a physician referral.   Increase activity slowly   Complete by: As directed         Allergies as of 04/27/2020      Reactions   Paxil [paroxetine Hcl]    Pt reports allergy from [place] paperwork (pt is prescribed this med)   Trazodone And Nefazodone    Wellbutrin [bupropion]       Medication List    STOP taking these medications   chlorthalidone 25 MG tablet Commonly known as: HYGROTON   HYDROcodone-acetaminophen 5-325 MG tablet Commonly known as: NORCO/VICODIN     TAKE these medications   acetaminophen 325 MG tablet Commonly known as: TYLENOL Take 650 mg by mouth 3 (three) times daily as needed for mild pain.   albuterol 108 (90 Base) MCG/ACT inhaler Commonly known as: VENTOLIN HFA Inhale 2 puffs into the lungs every 4 (four) hours as needed for wheezing or shortness of breath.    alfuzosin 10 MG 24 hr tablet Commonly known as: UROXATRAL Take 10 mg by mouth  daily with breakfast.   amLODipine 5 MG tablet Commonly known as: NORVASC Take 1 tablet (5 mg total) by mouth daily.   atorvastatin 80 MG tablet Commonly known as: LIPITOR Take 40 mg by mouth at bedtime.   azithromycin 500 MG tablet Commonly known as: ZITHROMAX Take 1 tablet (500 mg total) by mouth daily for 2 days.   cephALEXin 500 MG capsule Commonly known as: KEFLEX Take 1 capsule (500 mg total) by mouth every 8 (eight) hours for 3 days.   cholecalciferol 1000 units tablet Commonly known as: VITAMIN D Take 1,000 Units by mouth daily.   ferrous sulfate 325 (65 FE) MG tablet Take 1 tablet (325 mg total) by mouth daily.   fluticasone 50 MCG/ACT nasal spray Commonly known as: FLONASE Place 2 sprays into both nostrils daily as needed for allergies.   insulin aspart 100 UNIT/ML injection Commonly known as: novoLOG Inject 6 Units into the skin 3 (three) times daily with meals. What changed: how much to take   insulin glargine 100 UNIT/ML injection Commonly known as: LANTUS Inject 0.25 mLs (25 Units total) into the skin at bedtime.   loratadine 10 MG tablet Commonly known as: CLARITIN Take 10 mg by mouth daily.   metFORMIN 500 MG tablet Commonly known as: GLUCOPHAGE Take 500 mg by mouth 2 (two) times daily with a meal.   metoprolol tartrate 50 MG tablet Commonly known as: LOPRESSOR Take 1 tablet (50 mg total) by mouth 2 (two) times daily. What changed:   medication strength  how much to take   multivitamin tablet Take 1 tablet by mouth daily. Centrum Silver   omeprazole 20 MG capsule Commonly known as: PRILOSEC Take 20 mg by mouth 2 (two) times daily.   PARoxetine 40 MG tablet Commonly known as: PAXIL Take 40 mg by mouth daily.   potassium chloride SA 20 MEQ tablet Commonly known as: KLOR-CON Take 20 mEq by mouth daily.   senna-docusate 8.6-50 MG tablet Commonly known  as: Senokot-S Take 2 tablets by mouth at bedtime.   tamsulosin 0.4 MG Caps capsule Commonly known as: FLOMAX Take 0.4 mg by mouth 2 (two) times daily.   Tiotropium Bromide-Olodaterol 2.5-2.5 MCG/ACT Aers Inhale 2 puffs into the lungs daily.            Durable Medical Equipment  (From admission, onward)         Start     Ordered   04/27/20 0904  For home use only DME 3 n 1  Once        04/27/20 1884            Follow-up Information    Joneen Caraway [place], MD. Schedule an appointment as soon as possible for a visit in 1 week(s).   Specialty: Internal Medicine Contact information: [address] [place] [address] [phone]        [facility] Follow up.   Specialty: [place] Why: the office will call to schedule home health therapy visits Contact information: [place] [address] [phone]               TOTAL DISCHARGE TIME: 19 minutes  [place]  [facility] Pager on [email or web address]  04/27/2020, 11:50 AM

## 2020-04-27 NOTE — TOC Transition Note (Signed)
Transition of Care [facility]) - CM/SW Discharge Note   Patient Details  Name: Zayan Delvecchio MRN: [id number] Date of Birth: [birth year]  Transition of Care [facility] CM/SW Contact:  Bess Kinds, RN Phone Number: [phone] 04/27/2020, 9:51 AM   Clinical Narrative:     Spoke with patient on his mobile phone [phone]. PTA home with spouse. She will provide transportation home. 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 No problems with getting medications. Discussed HH PT/OT recommendations. Patient agreeable. Advised that d/t weekend that authorization for home health services would be through his Medicare A - patient agreeable. Discussed home health agency choice. Referral accepted by [facility] with probably start of care tomorrow. Returned call to patient on his mobile phone. Advised of accepting [facility] agency. Discussed recommendations for 3N1 bedside commode. Advised to discuss with his PCP at his next appointment. NCM unable to assist with this today d/t weekend and [facility] - [facility] does not have part B. Patient verbalized understanding. No further TOC needs identified.   Final next level of care: Home w Home Health Services Barriers to Discharge: No Barriers Identified   Patient Goals and CMS Choice Patient states their goals for this hospitalization and ongoing recovery are:: home with wife CMS Medicare.gov Compare Post Acute Care list provided to:: Patient Choice offered to / list presented to : Patient  Discharge Placement                       Discharge Plan and Services In-house Referral: NA Discharge Planning Services: CM Consult Post Acute Care Choice: Home Health, Durable Medical Equipment          DME Arranged: 3-N-1, N/A (unable to arrange [facility]t [facility] closed and does not have part B) DME Agency: NA       HH Arranged: PT, OT HH Agency: [facility] Date [facility] Agency Contacted: 04/27/20 Time HH Agency  Contacted: [phone] Representative spoke with at [facility] Agency: Amy  Social Determinants of Health (SDOH) Interventions     Readmission Risk Interventions No flowsheet data found.

## 2020-04-27 NOTE — Discharge Instructions (Signed)
Urinary Tract Infection, Adult A urinary tract infection (UTI) is an infection of any part of the urinary tract. The urinary tract includes:  The kidneys.  The ureters.  The bladder.  The urethra. These organs make, store, and get rid of pee (urine) in the body. What are the causes? This is caused by germs (bacteria) in your genital area. These germs grow and cause swelling (inflammation) of your urinary tract. What increases the risk? You are more likely to develop this condition if:  You have a small, thin tube (catheter) to drain pee.  You cannot control when you pee or poop (incontinence).  You are male, and: ? You use these methods to prevent pregnancy:  A medicine that kills sperm (spermicide).  A device that blocks sperm (diaphragm). ? You have low levels of a male hormone (estrogen). ? You are pregnant.  You have genes that add to your risk.  You are sexually active.  You take antibiotic medicines.  You have trouble peeing because of: ? A prostate that is bigger than normal, if you are male. ? A blockage in the part of your body that drains pee from the bladder (urethra). ? A kidney stone. ? A nerve condition that affects your bladder (neurogenic bladder). ? Not getting enough to drink. ? Not peeing often enough.  You have other conditions, such as: ? Diabetes. ? A weak disease-fighting system (immune system). ? Sickle cell disease. ? Gout. ? Injury of the spine. What are the signs or symptoms? Symptoms of this condition include:  Needing to pee right away (urgently).  Peeing often.  Peeing small amounts often.  Pain or burning when peeing.  Blood in the pee.  Pee that smells bad or not like normal.  Trouble peeing.  Pee that is cloudy.  Fluid coming from the vagina, if you are male.  Pain in the belly or lower back. Other symptoms include:  Throwing up (vomiting).  No urge to eat.  Feeling mixed up (confused).  Being tired  and grouchy (irritable).  A fever.  Watery poop (diarrhea). How is this treated? This condition may be treated with:  Antibiotic medicine.  Other medicines.  Drinking enough water. Follow these instructions at home:  Medicines  Take over-the-counter and prescription medicines only as told by your doctor.  If you were prescribed an antibiotic medicine, take it as told by your doctor. Do not stop taking it even if you start to feel better. General instructions  Make sure you: ? Pee until your bladder is empty. ? Do not hold pee for a long time. ? Empty your bladder after sex. ? Wipe from front to back after pooping if you are a male. Use each tissue one time when you wipe.  Drink enough fluid to keep your pee pale yellow.  Keep all follow-up visits as told by your doctor. This is important. Contact a doctor if:  You do not get better after 1-2 days.  Your symptoms go away and then come back. Get help right away if:  You have very bad back pain.  You have very bad pain in your lower belly.  You have a fever.  You are sick to your stomach (nauseous).  You are throwing up. Summary  A urinary tract infection (UTI) is an infection of any part of the urinary tract.  This condition is caused by germs in your genital area.  There are many risk factors for a UTI. These include having a small, thin   tube to drain pee and not being able to control when you pee or poop.  Treatment includes antibiotic medicines for germs.  Drink enough fluid to keep your pee pale yellow. This information is not intended to replace advice given to you by your health care provider. Make sure you discuss any questions you have with your health care provider. Document Revised: 04/28/2018 Document Reviewed: 11/18/2017 Elsevier Patient Education  2020 [employer]  

## 2020-04-27 NOTE — Progress Notes (Signed)
IV removal well tolerated, AVS explained to patient and patient able to teach back, patient to be transported by family car, wheeled in wheelchair to exit by staff.

## 2020-04-28 LAB — URINE CULTURE: Culture: NO GROWTH

## 2020-04-29 LAB — CULTURE, BLOOD (ROUTINE X 2)
Culture: NO GROWTH
Culture: NO GROWTH
Special Requests: ADEQUATE
Special Requests: ADEQUATE

## 2021-02-14 ENCOUNTER — Other Ambulatory Visit: Payer: Self-pay

## 2021-02-14 ENCOUNTER — Encounter: Payer: Self-pay | Admitting: Podiatry

## 2021-02-14 ENCOUNTER — Ambulatory Visit (INDEPENDENT_AMBULATORY_CARE_PROVIDER_SITE_OTHER): Payer: No Typology Code available for payment source | Admitting: Podiatry

## 2021-02-14 DIAGNOSIS — E1142 Type 2 diabetes mellitus with diabetic polyneuropathy: Secondary | ICD-10-CM

## 2021-02-14 DIAGNOSIS — E1140 Type 2 diabetes mellitus with diabetic neuropathy, unspecified: Secondary | ICD-10-CM | POA: Insufficient documentation

## 2021-02-14 NOTE — Progress Notes (Signed)
This patient presents to the office with chief complaint of  diabetic feet.  This patient  says there  is   pain and discomfort in his  feet.      Patient has no history of infection or drainage from both feet.  . This patient presents  to the office today for a foot evaluation due to history of  diabetes.  Patient says he does not know why he was referred to this office by the [facility].  General Appearance  Alert, conversant and in no acute stress.  Vascular  Dorsalis pedis and posterior tibial  pulses are absent  bilaterally.  Capillary return is within normal limits  bilaterally. Temperature is within normal limits  bilaterally.  Neurologic  Senn-Weinstein monofilament wire test diminished bilaterally. Muscle power within normal limits bilaterally.  Nails Thick disfigured discolored nails with subungual debris  from hallux to fifth toes bilaterally. No pain in nails.No evidence of bacterial infection or drainage bilaterally.  Orthopedic  No limitations of motion of motion feet .  No crepitus or effusions noted.  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  Skin  normotropic skin with no porokeratosis noted bilaterally.  No signs of infections or ulcers noted.     Onychomycosis  Diabetes with neuropathy.  IE   A diabetic foot exam was performed and there is evidence of vascular  and neurologic pathology.  Told patient he is eligible for diabetic shoes.  He will need to contact the [place] or make an appointment with this office if he has [employer].   Helane Gunther DPM

## 2021-04-24 ENCOUNTER — Emergency Department: Payer: No Typology Code available for payment source

## 2021-04-24 ENCOUNTER — Observation Stay: Payer: No Typology Code available for payment source

## 2021-04-24 ENCOUNTER — Other Ambulatory Visit: Payer: Self-pay

## 2021-04-24 ENCOUNTER — Inpatient Hospital Stay
Admission: EM | Admit: 2021-04-24 | Discharge: 2021-04-28 | DRG: 871 | Disposition: A | Discharge status: Home / Self Care | Payer: No Typology Code available for payment source | Attending: Internal Medicine | Admitting: Internal Medicine

## 2021-04-24 DIAGNOSIS — J449 Chronic obstructive pulmonary disease, unspecified: Secondary | ICD-10-CM | POA: Diagnosis present

## 2021-04-24 DIAGNOSIS — U071 COVID-19: Secondary | ICD-10-CM | POA: Diagnosis present

## 2021-04-24 DIAGNOSIS — A4189 Other specified sepsis: Secondary | ICD-10-CM | POA: Diagnosis not present

## 2021-04-24 DIAGNOSIS — R55 Syncope and collapse: Secondary | ICD-10-CM | POA: Diagnosis present

## 2021-04-24 DIAGNOSIS — T465X6A Underdosing of other antihypertensive drugs, initial encounter: Secondary | ICD-10-CM | POA: Diagnosis present

## 2021-04-24 DIAGNOSIS — I10 Essential (primary) hypertension: Secondary | ICD-10-CM | POA: Diagnosis present

## 2021-04-24 DIAGNOSIS — E785 Hyperlipidemia, unspecified: Secondary | ICD-10-CM | POA: Diagnosis present

## 2021-04-24 DIAGNOSIS — I248 Other forms of acute ischemic heart disease: Secondary | ICD-10-CM | POA: Diagnosis present

## 2021-04-24 DIAGNOSIS — Z833 Family history of diabetes mellitus: Secondary | ICD-10-CM

## 2021-04-24 DIAGNOSIS — R0602 Shortness of breath: Secondary | ICD-10-CM

## 2021-04-24 DIAGNOSIS — E119 Type 2 diabetes mellitus without complications: Secondary | ICD-10-CM

## 2021-04-24 DIAGNOSIS — Z87440 Personal history of urinary (tract) infections: Secondary | ICD-10-CM

## 2021-04-24 DIAGNOSIS — K219 Gastro-esophageal reflux disease without esophagitis: Secondary | ICD-10-CM | POA: Diagnosis present

## 2021-04-24 DIAGNOSIS — A419 Sepsis, unspecified organism: Secondary | ICD-10-CM | POA: Diagnosis present

## 2021-04-24 DIAGNOSIS — J1282 Pneumonia due to coronavirus disease 2019: Secondary | ICD-10-CM | POA: Diagnosis present

## 2021-04-24 DIAGNOSIS — F4310 Post-traumatic stress disorder, unspecified: Secondary | ICD-10-CM | POA: Diagnosis present

## 2021-04-24 DIAGNOSIS — J9601 Acute respiratory failure with hypoxia: Secondary | ICD-10-CM | POA: Diagnosis present

## 2021-04-24 DIAGNOSIS — J439 Emphysema, unspecified: Secondary | ICD-10-CM | POA: Diagnosis present

## 2021-04-24 DIAGNOSIS — Z720 Tobacco use: Secondary | ICD-10-CM | POA: Diagnosis present

## 2021-04-24 DIAGNOSIS — F17210 Nicotine dependence, cigarettes, uncomplicated: Secondary | ICD-10-CM | POA: Diagnosis present

## 2021-04-24 DIAGNOSIS — I129 Hypertensive chronic kidney disease with stage 1 through stage 4 chronic kidney disease, or unspecified chronic kidney disease: Secondary | ICD-10-CM | POA: Diagnosis present

## 2021-04-24 DIAGNOSIS — N1831 Chronic kidney disease, stage 3a: Secondary | ICD-10-CM | POA: Diagnosis present

## 2021-04-24 DIAGNOSIS — R0902 Hypoxemia: Secondary | ICD-10-CM

## 2021-04-24 DIAGNOSIS — F32A Depression, unspecified: Secondary | ICD-10-CM | POA: Diagnosis present

## 2021-04-24 DIAGNOSIS — Z9114 Patient's other noncompliance with medication regimen: Secondary | ICD-10-CM

## 2021-04-24 DIAGNOSIS — Z794 Long term (current) use of insulin: Secondary | ICD-10-CM

## 2021-04-24 DIAGNOSIS — R778 Other specified abnormalities of plasma proteins: Secondary | ICD-10-CM | POA: Diagnosis present

## 2021-04-24 DIAGNOSIS — Z8249 Family history of ischemic heart disease and other diseases of the circulatory system: Secondary | ICD-10-CM

## 2021-04-24 DIAGNOSIS — G9349 Other encephalopathy: Secondary | ICD-10-CM | POA: Diagnosis present

## 2021-04-24 DIAGNOSIS — G9341 Metabolic encephalopathy: Secondary | ICD-10-CM | POA: Diagnosis present

## 2021-04-24 DIAGNOSIS — E1122 Type 2 diabetes mellitus with diabetic chronic kidney disease: Secondary | ICD-10-CM | POA: Diagnosis present

## 2021-04-24 DIAGNOSIS — Z79899 Other long term (current) drug therapy: Secondary | ICD-10-CM

## 2021-04-24 DIAGNOSIS — N400 Enlarged prostate without lower urinary tract symptoms: Secondary | ICD-10-CM | POA: Diagnosis present

## 2021-04-24 LAB — CBC WITH DIFFERENTIAL/PLATELET
Abs Immature Granulocytes: 0.03 10*3/uL (ref 0.00–0.07)
Basophils Absolute: 0.1 10*3/uL (ref 0.0–0.1)
Basophils Relative: 1 %
Eosinophils Absolute: 0 10*3/uL (ref 0.0–0.5)
Eosinophils Relative: 0 %
HCT: 27.4 % — ABNORMAL LOW (ref 39.0–52.0)
Hemoglobin: 8.5 g/dL — ABNORMAL LOW (ref 13.0–17.0)
Immature Granulocytes: 0 %
Lymphocytes Relative: 13 %
Lymphs Abs: 1.2 10*3/uL (ref 0.7–4.0)
MCH: 25.1 pg — ABNORMAL LOW (ref 26.0–34.0)
MCHC: 31 g/dL (ref 30.0–36.0)
MCV: 80.8 fL (ref 80.0–100.0)
Monocytes Absolute: 1.2 10*3/uL — ABNORMAL HIGH (ref 0.1–1.0)
Monocytes Relative: 13 %
Neutro Abs: 6.9 10*3/uL (ref 1.7–7.7)
Neutrophils Relative %: 73 %
Platelets: 266 10*3/uL (ref 150–400)
RBC: 3.39 MIL/uL — ABNORMAL LOW (ref 4.22–5.81)
RDW: 15.6 % — ABNORMAL HIGH (ref 11.5–15.5)
WBC: 9.5 10*3/uL (ref 4.0–10.5)
nRBC: 0 % (ref 0.0–0.2)

## 2021-04-24 LAB — RESP PANEL BY RT-PCR (FLU A&B, COVID) ARPGX2
Influenza A by PCR: NEGATIVE
Influenza B by PCR: NEGATIVE
SARS Coronavirus 2 by RT PCR: POSITIVE — AB

## 2021-04-24 LAB — BASIC METABOLIC PANEL
Anion gap: 6 (ref 5–15)
BUN: 13 mg/dL (ref 8–23)
CO2: 24 mmol/L (ref 22–32)
Calcium: 8.5 mg/dL — ABNORMAL LOW (ref 8.9–10.3)
Chloride: 105 mmol/L (ref 98–111)
Creatinine, Ser: 1.31 mg/dL — ABNORMAL HIGH (ref 0.61–1.24)
GFR, Estimated: 57 mL/min — ABNORMAL LOW (ref >60)
Glucose, Bld: 101 mg/dL — ABNORMAL HIGH (ref 70–99)
Potassium: 3.6 mmol/L (ref 3.5–5.1)
Sodium: 135 mmol/L (ref 135–145)

## 2021-04-24 LAB — D-DIMER, QUANTITATIVE: D-Dimer, Quant: 1.27 ug/mL-FEU — ABNORMAL HIGH (ref 0.00–0.50)

## 2021-04-24 LAB — TROPONIN I (HIGH SENSITIVITY): Troponin I (High Sensitivity): 129 ng/L (ref <18)

## 2021-04-24 LAB — PROCALCITONIN: Procalcitonin: <0.1 ng/mL

## 2021-04-24 LAB — CBG MONITORING, ED: Glucose-Capillary: 111 mg/dL — ABNORMAL HIGH (ref 70–99)

## 2021-04-24 LAB — BRAIN NATRIURETIC PEPTIDE: B Natriuretic Peptide: 57.5 pg/mL (ref 0.0–100.0)

## 2021-04-24 MED ORDER — PREDNISONE 50 MG PO TABS
50.0000 mg | ORAL_TABLET | Freq: Every day | ORAL | Status: DC
  Administered 2021-04-28: 11:00:00 50 mg via ORAL
  Filled 2021-04-24: qty 1

## 2021-04-24 MED ORDER — ATORVASTATIN CALCIUM 20 MG PO TABS
40.0000 mg | ORAL_TABLET | Freq: Every day | ORAL | Status: DC
  Administered 2021-04-24 – 2021-04-27 (×4): 40 mg via ORAL
  Filled 2021-04-24 (×4): qty 2

## 2021-04-24 MED ORDER — AMLODIPINE BESYLATE 5 MG PO TABS
5.0000 mg | ORAL_TABLET | Freq: Every day | ORAL | Status: DC
  Administered 2021-04-24 – 2021-04-28 (×5): 5 mg via ORAL
  Filled 2021-04-24 (×5): qty 1

## 2021-04-24 MED ORDER — ALBUTEROL SULFATE HFA 108 (90 BASE) MCG/ACT IN AERS
2.0000 | INHALATION_SPRAY | RESPIRATORY_TRACT | Status: DC | PRN
  Filled 2021-04-24 (×2): qty 6.7

## 2021-04-24 MED ORDER — DEXAMETHASONE SODIUM PHOSPHATE 10 MG/ML IJ SOLN
10.0000 mg | Freq: Once | INTRAMUSCULAR | Status: AC
  Administered 2021-04-24: 10 mg via INTRAVENOUS
  Filled 2021-04-24: qty 1

## 2021-04-24 MED ORDER — IPRATROPIUM-ALBUTEROL 0.5-2.5 (3) MG/3ML IN SOLN
3.0000 mL | Freq: Once | RESPIRATORY_TRACT | Status: AC
  Administered 2021-04-24: 3 mL via RESPIRATORY_TRACT
  Filled 2021-04-24: qty 3

## 2021-04-24 MED ORDER — LACTATED RINGERS IV BOLUS
1000.0000 mL | Freq: Once | INTRAVENOUS | Status: AC
  Administered 2021-04-24: 1000 mL via INTRAVENOUS

## 2021-04-24 MED ORDER — FLUTICASONE PROPIONATE 50 MCG/ACT NA SUSP
2.0000 | Freq: Every day | NASAL | Status: DC | PRN
  Filled 2021-04-24: qty 16

## 2021-04-24 MED ORDER — TAMSULOSIN HCL 0.4 MG PO CAPS
0.4000 mg | ORAL_CAPSULE | Freq: Two times a day (BID) | ORAL | Status: DC
  Administered 2021-04-24 – 2021-04-28 (×8): 0.4 mg via ORAL
  Filled 2021-04-24 (×8): qty 1

## 2021-04-24 MED ORDER — SODIUM CHLORIDE 0.9 % IV SOLN
100.0000 mg | Freq: Every day | INTRAVENOUS | Status: AC
  Administered 2021-04-25 – 2021-04-28 (×4): 100 mg via INTRAVENOUS
  Filled 2021-04-24: qty 20
  Filled 2021-04-24 (×3): qty 100
  Filled 2021-04-24 (×2): qty 20

## 2021-04-24 MED ORDER — HYDRALAZINE HCL 50 MG PO TABS
25.0000 mg | ORAL_TABLET | Freq: Four times a day (QID) | ORAL | Status: DC | PRN
  Administered 2021-04-24 – 2021-04-25 (×2): 25 mg via ORAL
  Filled 2021-04-24 (×2): qty 1

## 2021-04-24 MED ORDER — ENOXAPARIN SODIUM 40 MG/0.4ML IJ SOSY
40.0000 mg | PREFILLED_SYRINGE | Freq: Every day | INTRAMUSCULAR | Status: DC
  Administered 2021-04-24 – 2021-04-27 (×4): 40 mg via SUBCUTANEOUS
  Filled 2021-04-24 (×4): qty 0.4

## 2021-04-24 MED ORDER — INSULIN ASPART 100 UNIT/ML IJ SOLN
0.0000 [IU] | Freq: Every day | INTRAMUSCULAR | Status: DC
Start: 2021-04-24 — End: 2021-04-28
  Administered 2021-04-26 – 2021-04-27 (×2): 3 [IU] via SUBCUTANEOUS
  Filled 2021-04-24 (×2): qty 1

## 2021-04-24 MED ORDER — PANTOPRAZOLE SODIUM 40 MG PO TBEC
40.0000 mg | DELAYED_RELEASE_TABLET | Freq: Every day | ORAL | Status: DC
  Administered 2021-04-25 – 2021-04-28 (×4): 40 mg via ORAL
  Filled 2021-04-24 (×4): qty 1

## 2021-04-24 MED ORDER — PAROXETINE HCL 20 MG PO TABS
40.0000 mg | ORAL_TABLET | Freq: Every day | ORAL | Status: DC
  Administered 2021-04-25 – 2021-04-28 (×4): 40 mg via ORAL
  Filled 2021-04-24 (×4): qty 2

## 2021-04-24 MED ORDER — ONDANSETRON HCL 4 MG PO TABS: 4.0000 mg | ORAL_TABLET | Freq: Four times a day (QID) | ORAL | Status: DC | PRN

## 2021-04-24 MED ORDER — UMECLIDINIUM BROMIDE 62.5 MCG/ACT IN AEPB
1.0000 | INHALATION_SPRAY | Freq: Every day | RESPIRATORY_TRACT | Status: DC
  Administered 2021-04-25 – 2021-04-28 (×4): 1 via RESPIRATORY_TRACT
  Filled 2021-04-24: qty 7

## 2021-04-24 MED ORDER — IPRATROPIUM-ALBUTEROL 0.5-2.5 (3) MG/3ML IN SOLN
3.0000 mL | Freq: Three times a day (TID) | RESPIRATORY_TRACT | Status: DC | PRN
  Administered 2021-04-25: 3 mL via RESPIRATORY_TRACT
  Filled 2021-04-24: qty 3

## 2021-04-24 MED ORDER — ACETAMINOPHEN 325 MG PO TABS
650.0000 mg | ORAL_TABLET | Freq: Four times a day (QID) | ORAL | Status: AC | PRN
  Administered 2021-04-24: 650 mg via ORAL
  Filled 2021-04-24: qty 2

## 2021-04-24 MED ORDER — SODIUM CHLORIDE 0.9 % IV SOLN
200.0000 mg | Freq: Once | INTRAVENOUS | Status: AC
  Administered 2021-04-24: 200 mg via INTRAVENOUS
  Filled 2021-04-24: qty 40

## 2021-04-24 MED ORDER — MELATONIN 5 MG PO TABS
5.0000 mg | ORAL_TABLET | Freq: Every evening | ORAL | Status: DC | PRN
  Administered 2021-04-25 – 2021-04-27 (×3): 5 mg via ORAL
  Filled 2021-04-24 (×4): qty 1

## 2021-04-24 MED ORDER — HYDROCOD POLST-CPM POLST ER 10-8 MG/5ML PO SUER
5.0000 mL | Freq: Every evening | ORAL | Status: DC | PRN
  Administered 2021-04-25 – 2021-04-27 (×3): 5 mL via ORAL
  Filled 2021-04-24 (×3): qty 5

## 2021-04-24 MED ORDER — HEPARIN SODIUM (PORCINE) 5000 UNIT/ML IJ SOLN: 5000.0000 [IU] | Freq: Three times a day (TID) | INTRAMUSCULAR | Status: DC

## 2021-04-24 MED ORDER — ONDANSETRON HCL 4 MG/2ML IJ SOLN: 4.0000 mg | Freq: Four times a day (QID) | INTRAMUSCULAR | Status: DC | PRN

## 2021-04-24 MED ORDER — NICOTINE 14 MG/24HR TD PT24: 14.0000 mg | MEDICATED_PATCH | Freq: Every day | TRANSDERMAL | Status: DC | PRN

## 2021-04-24 MED ORDER — ARFORMOTEROL TARTRATE 15 MCG/2ML IN NEBU
15.0000 ug | INHALATION_SOLUTION | Freq: Two times a day (BID) | RESPIRATORY_TRACT | Status: DC
  Administered 2021-04-24 – 2021-04-28 (×7): 15 ug via RESPIRATORY_TRACT
  Filled 2021-04-24 (×12): qty 2

## 2021-04-24 MED ORDER — METHYLPREDNISOLONE SODIUM SUCC 125 MG IJ SOLR
1.0000 mg/kg | Freq: Two times a day (BID) | INTRAMUSCULAR | Status: AC
  Administered 2021-04-24 – 2021-04-27 (×6): 83.75 mg via INTRAVENOUS
  Filled 2021-04-24 (×6): qty 2

## 2021-04-24 MED ORDER — ASPIRIN 81 MG PO CHEW
324.0000 mg | CHEWABLE_TABLET | Freq: Once | ORAL | Status: AC
  Administered 2021-04-24: 324 mg via ORAL
  Filled 2021-04-24: qty 4

## 2021-04-24 MED ORDER — IOHEXOL 350 MG/ML SOLN
80.0000 mL | Freq: Once | INTRAVENOUS | Status: AC | PRN
  Administered 2021-04-24: 80 mL via INTRAVENOUS

## 2021-04-24 MED ORDER — GUAIFENESIN-DM 100-10 MG/5ML PO SYRP: 10.0000 mL | ORAL_SOLUTION | ORAL | Status: DC | PRN

## 2021-04-24 MED ORDER — INSULIN GLARGINE-YFGN 100 UNIT/ML ~~LOC~~ SOLN
25.0000 [IU] | Freq: Every day | SUBCUTANEOUS | Status: DC
  Administered 2021-04-24 – 2021-04-27 (×4): 25 [IU] via SUBCUTANEOUS
  Filled 2021-04-24 (×7): qty 0.25

## 2021-04-24 MED ORDER — METOPROLOL TARTRATE 50 MG PO TABS
50.0000 mg | ORAL_TABLET | Freq: Two times a day (BID) | ORAL | Status: DC
  Administered 2021-04-24 – 2021-04-28 (×8): 50 mg via ORAL
  Filled 2021-04-24 (×8): qty 1

## 2021-04-24 MED ORDER — METOPROLOL TARTRATE 50 MG PO TABS: 50.0000 mg | ORAL_TABLET | Freq: Two times a day (BID) | ORAL | Status: DC

## 2021-04-24 MED ORDER — INSULIN ASPART 100 UNIT/ML IJ SOLN
0.0000 [IU] | Freq: Three times a day (TID) | INTRAMUSCULAR | Status: DC
Start: 2021-04-25 — End: 2021-04-28
  Administered 2021-04-25: 19:00:00 3 [IU] via SUBCUTANEOUS
  Administered 2021-04-25: 4 [IU] via SUBCUTANEOUS
  Administered 2021-04-25: 7 [IU] via SUBCUTANEOUS
  Administered 2021-04-26: 3 [IU] via SUBCUTANEOUS
  Administered 2021-04-26: 4 [IU] via SUBCUTANEOUS
  Administered 2021-04-26: 09:00:00 7 [IU] via SUBCUTANEOUS
  Administered 2021-04-27 – 2021-04-28 (×4): 4 [IU] via SUBCUTANEOUS
  Filled 2021-04-24 (×10): qty 1

## 2021-04-24 NOTE — ED Triage Notes (Signed)
Pt began having shortness of breath 2 days ago. Pt breathing has became more labored, and chills. Pt denies fever, n&v or diarrhea.

## 2021-04-24 NOTE — ED Provider Notes (Signed)
  Emergency Medicine Provider Triage Evaluation Note  [name] Parks , a 75 y.o.male,  was evaluated in triage.  Pt complains of SOB.  Patient states he has been feeling short of breath for the past 2 days along with some fever/chills.  Denies chest pain, abdominal pain, flank pain, urinary symptoms, or back pain.  He is currently tachypneic and satting at 89% on room air.    Review of Systems  Positive: SOB Negative: Denies fever, chest pain, vomiting  Physical Exam  There were no vitals filed for this visit. Gen:   Awake, no distress   Resp:  Normal effort  MSK:   Moves extremities without difficulty  Other:    Medical Decision Making  Given the patient's initial medical screening exam, the following diagnostic evaluation has been ordered. The patient will be placed in the appropriate treatment space, once one is available, to complete the evaluation and treatment. I have discussed the plan of care with the patient and I have advised the patient that an ED physician or mid-level practitioner will reevaluate their condition after the test results have been received, as the results may give them additional insight into the type of treatment they may need.    Diagnostics: Labs, EKG, CXR  Treatments: Oxygen   Varney Daily, [place] 04/24/21 1614    Phineas Semen, MD 04/24/21 Windy Fast

## 2021-04-24 NOTE — ED Notes (Signed)
Pt to xray

## 2021-04-24 NOTE — ED Provider Notes (Signed)
[facility] Emergency Department Provider Note  ____________________________________________   I have reviewed the triage vital signs and the nursing notes.   HISTORY  Chief Complaint Shortness of Breath   History limited by: Not Limited   HPI [name] Parks is a 75 y.o. male who presents to the emergency department today because of concerns for shortness of breath.  Patient states the symptoms started 2 days ago.  They have been fairly constant since then.  He denies any significant chest pain with this.  No hematemesis.  Patient has felt like he has had some chills.  Denies any nausea or vomiting.  He denies any known sick contacts.  Patient states he has a history of emphysema.  Records reviewed. Per medical record review patient has a history of COPD, HLD, HTN.   Past Medical History:  Diagnosis Date   CAP (community acquired pneumonia) 10/17/2013   COPD (chronic obstructive pulmonary disease) (HCC)    Hyperlipidemia    Hypertension    Migraines    "long time ago; none lately" (10/17/2013)   OSA on CPAP    PTSD (post-traumatic stress disorder)    Shortness of breath dyspnea    Type II diabetes mellitus (HCC)     Patient Active Problem List   Diagnosis Date Noted   Diabetic neuropathy (HCC) 02/14/2021   SIRS (systemic inflammatory response syndrome) (HCC) 04/24/2020   Sepsis (HCC) 04/24/2020   Acute lower UTI 04/24/2020   Acute kidney injury (HCC) 06/13/2017   Lower urinary tract infectious disease 06/12/2017   Abnormal nuclear stress test 09/21/2014   Near syncope    Atypical chest pain    Diarrhea 08/29/2014   Tobacco abuse 08/29/2014   Elevated troponin 08/29/2014   Dehydration 08/29/2014   COPD (chronic obstructive pulmonary disease) (HCC) 08/29/2014   Syncope 08/29/2014   Diabetes mellitus without complication (HCC)    SOB (shortness of breath)    Dyspnea 05/07/2014   HCAP (healthcare-associated pneumonia) 10/17/2013   COPD  exacerbation (HCC) 10/17/2013   Leukocytosis 10/17/2013   Nicotine abuse 10/17/2013   Diabetes mellitus (HCC) 10/17/2013   Hypertension 10/17/2013   Hyperlipidemia 10/17/2013    Past Surgical History:  Procedure Laterality Date   EXCISIONAL HEMORRHOIDECTOMY  1970's?   JOINT REPLACEMENT     LEFT HEART CATHETERIZATION WITH CORONARY ANGIOGRAM N/A 09/21/2014   Procedure: LEFT HEART CATHETERIZATION WITH CORONARY ANGIOGRAM;  Surgeon: Peter M Swaziland, MD;  Location: [facility] CATH LAB;  Service: Cardiovascular;  Laterality: N/A;   TOTAL KNEE ARTHROPLASTY Right ~ 2012   TOTAL KNEE ARTHROPLASTY Left ~ 2013    Prior to Admission medications   Medication Sig Start Date End Date Taking? Authorizing Provider  acetaminophen (TYLENOL) 325 MG tablet Take 650 mg by mouth 3 (three) times daily as needed for mild pain.    [provider]  albuterol (PROVENTIL HFA;VENTOLIN HFA) 108 (90 BASE) MCG/ACT inhaler Inhale 2 puffs into the lungs every 4 (four) hours as needed for wheezing or shortness of breath. 05/08/14   Ardith Dark, MD  alfuzosin (UROXATRAL) 10 MG 24 hr tablet Take 10 mg by mouth daily with breakfast.    [provider]  amLODipine (NORVASC) 5 MG tablet Take 1 tablet (5 mg total) by mouth daily. 04/27/20   Osvaldo Shipper, MD  atorvastatin (LIPITOR) 80 MG tablet Take 40 mg by mouth at bedtime.     [provider]  cholecalciferol (VITAMIN D) 1000 UNITS tablet Take 1,000 Units by mouth daily.  [provider]  ferrous sulfate 325 (65 FE) MG tablet Take 1 tablet (325 mg total) by mouth daily. 06/28/19   Zadie Rhine, MD  fluticasone (FLONASE) 50 MCG/ACT nasal spray Place 2 sprays into both nostrils daily as needed for allergies.     [provider]  insulin aspart (NOVOLOG) 100 UNIT/ML injection Inject 6 Units into the skin 3 (three) times daily with meals. Patient taking differently: Inject 4 Units into the skin 3 (three) times daily with meals. 10/20/13    Dellinger, Tora Kindred, PA-C  insulin glargine (LANTUS) 100 UNIT/ML injection Inject 0.25 mLs (25 Units total) into the skin at bedtime. 10/20/13   Dellinger, Tora Kindred, PA-C  loratadine (CLARITIN) 10 MG tablet Take 10 mg by mouth daily.     [provider]  metFORMIN (GLUCOPHAGE) 500 MG tablet Take 500 mg by mouth 2 (two) times daily with a meal.     [provider]  metoprolol tartrate (LOPRESSOR) 50 MG tablet Take 1 tablet (50 mg total) by mouth 2 (two) times daily. 04/27/20   Osvaldo Shipper, MD  Multiple Vitamin (MULTIVITAMIN) tablet Take 1 tablet by mouth daily. Centrum Silver    [provider]  omeprazole (PRILOSEC) 20 MG capsule Take 20 mg by mouth 2 (two) times daily.    [provider]  PARoxetine (PAXIL) 40 MG tablet Take 40 mg by mouth daily.    [provider]  potassium chloride SA (K-DUR,KLOR-CON) 20 MEQ tablet Take 20 mEq by mouth daily.    [provider]  senna-docusate (SENOKOT-S) 8.6-50 MG per tablet Take 2 tablets by mouth at bedtime.    [provider]  tamsulosin (FLOMAX) 0.4 MG CAPS capsule Take 0.4 mg by mouth 2 (two) times daily.    [provider]  Tiotropium Bromide-Olodaterol 2.5-2.5 MCG/ACT AERS Inhale 2 puffs into the lungs daily.    [provider]    Allergies Bupropion, Paroxetine, Paxil [paroxetine hcl], Terazosin, and Trazodone and nefazodone  Family History  Problem Relation Age of Onset   Diabetes Mother    Heart attack Father    Diabetes Sister     Social History Social History   Tobacco Use   Smoking status: Every Day    Packs/day: 1.00    Years: 50.00    Pack years: 50.00    Types: Cigarettes   Smokeless tobacco: Never  Substance Use Topics   Alcohol use: No    Comment: "used to drink a long time ago; none since the 1970's"   Drug use: No    Review of Systems Constitutional: Positive for chills Eyes: No visual changes. ENT: No sore  throat. Cardiovascular: Denies chest pain. Respiratory: Positive for shortness of breath. Gastrointestinal: No abdominal pain.  No nausea, no vomiting.  No diarrhea.   Genitourinary: Negative for dysuria. Musculoskeletal: Negative for back pain. Skin: Negative for rash. Neurological: Negative for headaches, focal weakness or numbness.  ____________________________________________   PHYSICAL EXAM:  VITAL SIGNS: ED Triage Vitals  Enc Vitals Group     BP 04/24/21 1611 (!) 171/81     Pulse Rate 04/24/21 1611 89     Resp 04/24/21 1611 (!) 28     Temp 04/24/21 1611 99.9 F (37.7 C)     Temp Source 04/24/21 1611 Oral     SpO2 04/24/21 1610 (!) 89 %     Weight 04/24/21 1615 184 lb (83.5 kg)     Height 04/24/21 1615 5\' 11"  (1.803 m)  Head Circumference --      Peak Flow --      Pain Score 04/24/21 1615 0   Constitutional: Alert and oriented.  Eyes: Conjunctivae are normal.  ENT      Head: Normocephalic and atraumatic.      Nose: No congestion/rhinnorhea.      Mouth/Throat: Mucous membranes are moist.      Neck: No stridor. Hematological/Lymphatic/Immunilogical: No cervical lymphadenopathy. Cardiovascular: Tachycardia, regular rhythm.  No murmurs, rubs, or gallops.  Respiratory: Slightly increased respiratory effort. Diffuse expiratory wheezing.  Gastrointestinal: Soft and non tender. No rebound. No guarding.  Genitourinary: Deferred Musculoskeletal: Normal range of motion in all extremities. No lower extremity edema. Neurologic:  Normal speech and language. No gross focal neurologic deficits are appreciated.  Skin:  Skin is warm, dry and intact. No rash noted. Psychiatric: Mood and affect are normal. Speech and behavior are normal. Patient exhibits appropriate insight and judgment.  ____________________________________________    LABS (pertinent positives/negatives)  BMP na 135, k 3.6, glu 101, cr 1.31 Trop hs 129 COVID positive CBC wbc 9.5, hgb 8.5, plt  266  ____________________________________________   EKG  I, Phineas Semen, attending physician, personally viewed and interpreted this EKG  EKG Time: 1610 Rate: 129 Rhythm: sinus tachycardia Axis: normal Intervals: qtc 462 QRS: narrow, LVH, q waves V1, V2 ST changes: no st elevation Impression: abnormal ekg   ____________________________________________    RADIOLOGY  CXR COPD. No acute abnormality  ____________________________________________   PROCEDURES  Procedures  ____________________________________________   INITIAL IMPRESSION / ASSESSMENT AND PLAN / ED COURSE  Pertinent labs & imaging results that were available during my care of the patient were reviewed by me and considered in my medical decision making (see chart for details).   Patient presented to the emergency department today because of concerns for shortness of breath for 2 days.  Patient was found to be hypoxic on room air here in the emergency department.  Patient has history of COPD.  Did test positive for COVID.  Will start steroids and remdesivir.  Will plan on admission.  Discussed findings and plan with patient.  ___________________________________________   FINAL CLINICAL IMPRESSION(S) / ED DIAGNOSES  Final diagnoses:  Hypoxia  COVID-19  Shortness of breath     Note: This dictation was prepared with Dragon dictation. Any transcriptional errors that result from this process are unintentional     Phineas Semen, MD 04/24/21 1945

## 2021-04-24 NOTE — ED Notes (Signed)
Recheck trop sent

## 2021-04-24 NOTE — H&P (Addendum)
History and Physical   [name] Parks [id number] DOB: 10/01/1945 DOA: 04/24/2021  PCP: [name], [place], MD  Outpatient Specialists: Dr. Prudence Davidson, podiatry Patient coming from: home   I have personally briefly reviewed patient's old medical records in [place].  Chief Concern: Shortness of breath  HPI: [name] Parks is a 75 y.o. male with medical history significant for hypertension, anxiety, depression, COPD, tobacco abuse, insulin-dependent diabetes type 2, who presents emergency department for chief concerns of shortness of breath for 2 days.  At bedside he is able to tell me his name, age, current location of hospital.  He was sleeping when I entered the room.  He was able to participate in the HPI once I woke him up.  He reports that he has had shortness of breath the last 2 to 3 days.  He denies known sick contacts.  He denies any subjective fever and chills, however he states he does not know as he did not check his temperature.  He denies new cough.  Of note, he reports one episode of passing out on 04/23/21. He does not know how long he was out. He denies chest pain, fever, loss of appetite, weight changes, dysuria, dysphagia, hematuria, diarrhea. His last BM was 11/30 and it was normal brown.   He reports the shortness of breath is worsening and worse with exertion. He states he has never felt this short of breath before and that this is different from his prior episode of COPD exacerbation.  Social history: He lives at home with his wife. He is current tobacco user, 1 ppd, started smoking at age [age]. He denies etoh and recreation drug use. He formerly work Biomedical scientist.   Vaccination history: He is vaccinated for covid 19, with 3 or 4 doses. He has not had the most recent booster.   ROS: Constitutional: no weight change, no fever ENT/Mouth: no sore throat, no rhinorrhea Eyes: no eye pain, no vision changes Cardiovascular: no chest pain, no dyspnea,  no edema, no  palpitations Respiratory: no cough, no sputum, no wheezing Gastrointestinal: no nausea, no vomiting, no diarrhea, no constipation Genitourinary: no urinary incontinence, no dysuria, no hematuria Musculoskeletal: no arthralgias, no myalgias Skin: no skin lesions, no pruritus, Neuro: + weakness, no loss of consciousness, + syncope Psych: no anxiety, no depression, + decrease appetite Heme/Lymph: no bruising, no bleeding  ED Course: Discussed with emergency medicine provider, patient requiring hospitalization for chief concerns of acute hypoxia secondary to COVID infection.  Vitals in the emergency department was remarkable for temperature of 99.9, respiration rate of 28 and improved to 24, initial heart rate of 126 and improved to 116, blood pressure 183/89, SPO2 of 96% on 2 L nasal cannula.  Labs in the emergency department showed serum sodium of 135, potassium 3.6, chloride 105, bicarb 24, BUN of 13, serum creatinine of 1.31, nonfasting blood glucose 101, WBC 9.5, hemoglobin 8.5, platelets 266.  High sensitive troponin was elevated at 121, BNP is 57.5.  COVID PCR was positive.  Influenza A/influenza B PCR were negative.  In the emergency department patient received 1 treatment of DuoNeb's, Decadron 10 mg IV, aspirin 324 mg.  Patient also received lactated ringer 1000 mL bolus.  Assessment/Plan  Principal Problem:   Acute hypoxemic respiratory failure due to COVID-19 [facility]) Active Problems:   Diabetes mellitus (HCC)   Hypertension   Hyperlipidemia   SOB (shortness of breath)   Tobacco abuse   Elevated troponin   COPD (chronic obstructive pulmonary disease) ([place])  Sepsis (HCC)   # Acute hypoxic respiratory failure secondary to COVID-19 # COVID-19 positive on PCR with patient endorsement of syncope on 11/30 # Patient meets sepsis criteria with the very definition, in setting of increased heart rate, respiration rate, organ involvement of cardiac and pulmonary, source of COVID-19  pneumonia - Continue remdesivir per pharmacy - Status post Decadron 10 mg IV once per EDP - Initiated patient on Solu-Medrol IV steroid dosing at 1 mg/kg with transition to prednisone - Supportive measures, oxygen supplementation to maintain SPO2 greater than 90 for patient comfort, albuterol inhaler as needed, DuoNebs as needed, Robitussin-DM p.o. every 4 hours as needed for cough; Tussionex 5 mL p.o. nightly as needed for cough suppression to promote healthy sleep - Flutter valve, incentive spirometer - Check procalcitonin - Continue Airborne and contact precautions  # Syncopal event with sinus tachycardia-check D-dimer - If positive, we will order CTA to assess for pulmonary embolism - I attempted to call his spouse at all the numbers listed in his chart for further information, however there was no pickup - Message nursing staff via secure chat regarding the plan  # Elevated troponin with small positive delta - Initial high sensitive troponin was 121 and increased to 129 - At this time patient denies any chest pain and states that the shortness of breath is improving with oxygen supplementation - Given patient has sinus tachycardia, with elevated blood pressure in setting of not taking his antihypertensive medications, this is presumed to be secondary to demand ischemia - Pulmonary embolism cannot be excluded at this time given the patient is tested positive for COVID-19 - Plan as above  # Hypertension-elevated and he did not take his antihypertensive medications day of admission, presumed secondary to multifactorial including hypoxic respiratory distress in setting of COVID-19 infection - Resumed home amlodipine 5 mg daily, metoprolol tartrate 50 mg p.o. twice daily - As needed hydralazine 25 mg p.o. 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  # Tobacco use disorder-nicotine patch as needed ordered - Greater than 3 minutes spent counseling patient on tobacco cessation  # Depression/anxiety-resumed paroxetine 40 mg p.o. daily  # BPH-Flomax resumed  # GERD-PPI  Chart reviewed.   Hospitalization from 04/24/2020 to 04/27/2020: Patient was hospitalized for UTI with sepsis, aspiration versus commune acquired pneumonia, acute metabolic encephalopathy.  Patient was initially started on ceftriaxone and azithromycin.  With procalcitonin mildly elevated at 0.55.  Cultures were negative.  Patient was transitioned to Keflex and oral azithromycin.  DVT prophylaxis: Enoxaparin 40 mg subcutaneous nightly Code Status: Full code Diet: Heart healthy/carb modified Family Communication: Attempted to call spouse, Mrs. Haydon Dorris, at [phone], went to voicemail, with message stating that the voicemail was full; attempted to call home phone number at [phone], no pickup Disposition Plan: Pending clinical course, anticipate less than 2 night stay Consults called: None at this time Admission status: Telemetry  medical, observation  Past Medical History:  Diagnosis Date   CAP (community acquired pneumonia) 10/17/2013   COPD (chronic obstructive pulmonary disease) ([place])    Hyperlipidemia    Hypertension    Migraines    "long time ago; none lately" (10/17/2013)   OSA on CPAP    PTSD (post-traumatic stress disorder)    Shortness of breath dyspnea    Type II diabetes mellitus  ([place])    Past Surgical History:  Procedure Laterality Date   EXCISIONAL HEMORRHOIDECTOMY  1970's?   JOINT REPLACEMENT     LEFT HEART CATHETERIZATION WITH CORONARY ANGIOGRAM N/A 09/21/2014   Procedure: LEFT HEART CATHETERIZATION WITH CORONARY ANGIOGRAM;  Surgeon: Peter M Martinique, MD;  Location[facility] CATH LAB;  Service: Cardiovascular;  Laterality: N/A;   TOTAL KNEE ARTHROPLASTY Right ~ 2012   TOTAL KNEE ARTHROPLASTY Left ~ 2013   Social History:  reports that he has been smoking cigarettes. He has a 50.00 pack-year smoking history. He has never used smokeless tobacco. He reports that he does not drink alcohol and does not use drugs.  Allergies  Allergen Reactions   Bupropion Other (See Comments)   Paroxetine Other (See Comments)   Paxil [Paroxetine Hcl]     Pt reports allergy from [place] paperwork (pt is prescribed this med)   Terazosin Other (See Comments)   Trazodone And Nefazodone    Family History  Problem Relation Age of Onset   Diabetes Mother    Heart attack Father    Diabetes Sister    Family history: Family history reviewed and not pertinent  Prior to Admission medications   Medication Sig Start Date End Date Taking? Authorizing Provider  acetaminophen (TYLENOL) 325 MG tablet Take 650 mg by mouth 3 (three) times daily as needed for mild pain.    [provider]  albuterol (PROVENTIL HFA;VENTOLIN HFA) 108 (90 BASE) MCG/ACT inhaler Inhale 2 puffs into the lungs every 4 (four) hours as needed for wheezing or shortness of breath. 05/08/14   Vivi Barrack, MD  alfuzosin (UROXATRAL) 10 MG 24 hr tablet Take 10 mg by mouth daily with breakfast.    [provider]  amLODipine (NORVASC) 5 MG tablet Take 1 tablet (5 mg total) by mouth daily. 04/27/20   Bonnielee Haff, MD  atorvastatin (LIPITOR) 80 MG tablet Take 40 mg by mouth at bedtime.     [provider]  cholecalciferol (VITAMIN D) 1000 UNITS tablet Take 1,000 Units by mouth daily.    [provider]  ferrous sulfate 325 (65 FE) MG tablet Take 1 tablet (325 mg total) by mouth daily. 06/28/19   Ripley Fraise, MD  fluticasone (FLONASE) 50 MCG/ACT nasal spray Place 2 sprays into both nostrils daily as needed for allergies.     [provider]  insulin aspart (NOVOLOG) 100 UNIT/ML injection Inject 6 Units into the skin 3 (three) times daily with meals. Patient taking differently: Inject 4 Units into the skin 3 (three) times daily with meals. 10/20/13   Dellinger, Bobby Rumpf, PA-C  insulin glargine (LANTUS) 100 UNIT/ML injection Inject 0.25 mLs (25 Units total) into the skin at bedtime. 10/20/13   Dellinger, Bobby Rumpf, PA-C  loratadine (CLARITIN) 10 MG tablet Take 10 mg by mouth daily.     [provider]  metFORMIN (GLUCOPHAGE) 500 MG tablet Take 500 mg by mouth 2 (two) times daily with a meal.     [provider]  metoprolol tartrate (LOPRESSOR) 50 MG tablet Take 1 tablet (50 mg  total) by mouth 2 (two) times daily. 04/27/20   Bonnielee Haff, MD  Multiple Vitamin (MULTIVITAMIN) tablet Take 1 tablet by mouth daily. Centrum Silver    [provider]  omeprazole (PRILOSEC) 20 MG capsule Take 20 mg by mouth 2 (two) times daily.    [provider]  PARoxetine (PAXIL) 40 MG tablet Take 40 mg by mouth daily.    [provider]  potassium chloride SA (K-DUR,KLOR-CON) 20 MEQ tablet Take 20 mEq by mouth daily.    [provider]  senna-docusate (SENOKOT-S) 8.6-50 MG per tablet Take 2 tablets by mouth at bedtime.    [provider]  tamsulosin (FLOMAX) 0.4 MG CAPS capsule Take 0.4 mg by mouth 2 (two) times daily.    [provider]  Tiotropium Bromide-Olodaterol 2.5-2.5 MCG/ACT AERS Inhale 2 puffs into the lungs daily.    [provider]   Physical Exam: Vitals:   04/24/21 1630 04/24/21 1700 04/24/21 1730 04/24/21 1800  BP: (!) 153/77 (!) 183/89 (!) 167/88 (!) 180/91  Pulse: (!) 125 (!) 126 (!) 121 (!) 116  Resp:     (!) 24  Temp:      TempSrc:      SpO2: 98% 96% 100% 100%  Weight:      Height:       Constitutional: appears age-appropriate, NAD, calm, comfortable Eyes: PERRL, lids and conjunctivae normal ENMT: Mucous membranes are moist. Posterior pharynx clear of any exudate or lesions. Age-appropriate dentition. Hearing appropriate Neck: normal, supple, no masses, no thyromegaly Respiratory: Mild diffuse wheezing; no crackles.  Mild increase respiratory effort. No accessory muscle use.  Cardiovascular: Regular rate and rhythm, no murmurs / rubs / gallops. No extremity edema. 2+ pedal pulses. No carotid bruits.  Abdomen: no tenderness, no masses palpated, no hepatosplenomegaly. Bowel sounds positive.  Musculoskeletal: no clubbing / cyanosis. No joint deformity upper and lower extremities. Good ROM, no contractures, no atrophy. Normal muscle tone.  Skin: no rashes, lesions, ulcers. No induration Neurologic: Sensation intact. Strength 5/5 in all 4.  Psychiatric: Normal judgment and insight. Alert and oriented x 3. Normal mood.   EKG: independently reviewed, showing sinus tachycardia with rate of 129, QTc 462, left ventricular hypertrophy  Chest x-ray on Admission: I personally reviewed and I agree with radiologist reading as below.  DG Chest 2 View  Result Date: 04/24/2021 CLINICAL DATA:  Short of breath EXAM: CHEST - 2 VIEW COMPARISON:  04/24/2020 FINDINGS: COPD. Emphysema. Reticular markings in the bases most consistent with chronic scarring. Negative for acute infiltrate or effusion. Atherosclerotic calcification aortic arch. IMPRESSION: COPD with scarring in the bases.  No superimposed acute abnormality. Electronically Signed   By: Franchot Gallo M.D.   On: 04/24/2021 16:56    Labs on Admission: I have personally reviewed following labs  CBC: Recent Labs  Lab 04/24/21 1613  WBC 9.5  NEUTROABS 6.9  HGB 8.5*  HCT 27.4*  MCV 80.8  PLT 123456   Basic Metabolic Panel: Recent Labs  Lab  04/24/21 1754  NA 135  K 3.6  CL 105  CO2 24  GLUCOSE 101*  BUN 13  CREATININE 1.31*  CALCIUM 8.5*   GFR: Estimated Creatinine Clearance: 51.9 mL/min (A) (by C-G formula based on SCr of 1.31 mg/dL (H)).  Urine analysis:    Component Value Date/Time   COLORURINE STRAW (A) 04/24/2020 0342   APPEARANCEUR CLEAR 04/24/2020 0342   LABSPEC 1.004 (L) 04/24/2020 0342   PHURINE 6.0 04/24/2020 0342   GLUCOSEU NEGATIVE 04/24/2020  [place] (A) 04/24/2020 0342   BILIRUBINUR NEGATIVE 04/24/2020 [place] 04/24/2020 0342   PROTEINUR 30 (A) 04/24/2020 0342   UROBILINOGEN 0.2 08/28/2014 1715   NITRITE NEGATIVE 04/24/2020 0342   LEUKOCYTESUR MODERATE (A) 04/24/2020 0342   CRITICAL CARE Performed by: Briant Cedar Vira Chaplin  Total critical care time: 35 minutes  Critical care time was exclusive of separately billable procedures and treating other patients.  Critical care was necessary to treat or prevent imminent or life-threatening deterioration.  Critical care was time spent personally by me on the following activities: development of treatment plan with patient and/or surrogate as well as nursing, discussions with consultants, evaluation of patient's response to treatment, examination of patient, obtaining history from patient or surrogate, ordering and performing treatments and interventions, ordering and review of laboratory studies, ordering and review of radiographic studies, pulse oximetry and re-evaluation of patient's condition.  Dr. Tobie Poet [facility]  If 7PM-7AM, please contact overnight-coverage provider If 7AM-7PM, please contact day coverage provider [email or web address]  04/24/2021, 9:05 PM

## 2021-04-24 NOTE — ED Notes (Signed)
New green top collected due to previous hemolysis. Notified MD of critical troponin result of 121.

## 2021-04-24 NOTE — Progress Notes (Signed)
Remdesivir - Pharmacy Brief Note   O:  ALT: no recent value CXR: COPD with scarring in the bases.  No superimposed acute abnormality SpO2: 100% on 2L   A/P:  Remdesivir 200 mg IVPB once followed by 100 mg IVPB daily x 4 days.   Burnis Medin, PharmD 04/24/2021 6:27 PM

## 2021-04-25 DIAGNOSIS — G9349 Other encephalopathy: Secondary | ICD-10-CM | POA: Diagnosis present

## 2021-04-25 DIAGNOSIS — U071 COVID-19: Secondary | ICD-10-CM

## 2021-04-25 DIAGNOSIS — J1282 Pneumonia due to coronavirus disease 2019: Secondary | ICD-10-CM | POA: Diagnosis present

## 2021-04-25 DIAGNOSIS — F4310 Post-traumatic stress disorder, unspecified: Secondary | ICD-10-CM | POA: Diagnosis present

## 2021-04-25 DIAGNOSIS — E785 Hyperlipidemia, unspecified: Secondary | ICD-10-CM | POA: Diagnosis present

## 2021-04-25 DIAGNOSIS — E1122 Type 2 diabetes mellitus with diabetic chronic kidney disease: Secondary | ICD-10-CM | POA: Diagnosis present

## 2021-04-25 DIAGNOSIS — R0602 Shortness of breath: Secondary | ICD-10-CM | POA: Diagnosis present

## 2021-04-25 DIAGNOSIS — J9601 Acute respiratory failure with hypoxia: Secondary | ICD-10-CM

## 2021-04-25 DIAGNOSIS — Z794 Long term (current) use of insulin: Secondary | ICD-10-CM | POA: Diagnosis not present

## 2021-04-25 DIAGNOSIS — K219 Gastro-esophageal reflux disease without esophagitis: Secondary | ICD-10-CM | POA: Diagnosis present

## 2021-04-25 DIAGNOSIS — Z9114 Patient's other noncompliance with medication regimen: Secondary | ICD-10-CM | POA: Diagnosis not present

## 2021-04-25 DIAGNOSIS — Z833 Family history of diabetes mellitus: Secondary | ICD-10-CM | POA: Diagnosis not present

## 2021-04-25 DIAGNOSIS — F17210 Nicotine dependence, cigarettes, uncomplicated: Secondary | ICD-10-CM | POA: Diagnosis present

## 2021-04-25 DIAGNOSIS — R55 Syncope and collapse: Secondary | ICD-10-CM | POA: Diagnosis present

## 2021-04-25 DIAGNOSIS — I129 Hypertensive chronic kidney disease with stage 1 through stage 4 chronic kidney disease, or unspecified chronic kidney disease: Secondary | ICD-10-CM | POA: Diagnosis present

## 2021-04-25 DIAGNOSIS — T465X6A Underdosing of other antihypertensive drugs, initial encounter: Secondary | ICD-10-CM | POA: Diagnosis present

## 2021-04-25 DIAGNOSIS — G9341 Metabolic encephalopathy: Secondary | ICD-10-CM | POA: Diagnosis present

## 2021-04-25 DIAGNOSIS — Z8249 Family history of ischemic heart disease and other diseases of the circulatory system: Secondary | ICD-10-CM | POA: Diagnosis not present

## 2021-04-25 DIAGNOSIS — J439 Emphysema, unspecified: Secondary | ICD-10-CM | POA: Diagnosis present

## 2021-04-25 DIAGNOSIS — N1831 Chronic kidney disease, stage 3a: Secondary | ICD-10-CM | POA: Diagnosis present

## 2021-04-25 DIAGNOSIS — A4189 Other specified sepsis: Secondary | ICD-10-CM | POA: Diagnosis present

## 2021-04-25 DIAGNOSIS — F32A Depression, unspecified: Secondary | ICD-10-CM | POA: Diagnosis present

## 2021-04-25 DIAGNOSIS — R778 Other specified abnormalities of plasma proteins: Secondary | ICD-10-CM | POA: Diagnosis present

## 2021-04-25 DIAGNOSIS — I248 Other forms of acute ischemic heart disease: Secondary | ICD-10-CM | POA: Diagnosis present

## 2021-04-25 DIAGNOSIS — Z79899 Other long term (current) drug therapy: Secondary | ICD-10-CM | POA: Diagnosis not present

## 2021-04-25 DIAGNOSIS — N400 Enlarged prostate without lower urinary tract symptoms: Secondary | ICD-10-CM | POA: Diagnosis present

## 2021-04-25 LAB — CBC WITH DIFFERENTIAL/PLATELET
Abs Immature Granulocytes: 0.02 10*3/uL (ref 0.00–0.07)
Basophils Absolute: 0.1 10*3/uL (ref 0.0–0.1)
Basophils Relative: 1 %
Eosinophils Absolute: 0 10*3/uL (ref 0.0–0.5)
Eosinophils Relative: 0 %
HCT: 27.9 % — ABNORMAL LOW (ref 39.0–52.0)
Hemoglobin: 8.7 g/dL — ABNORMAL LOW (ref 13.0–17.0)
Immature Granulocytes: 0 %
Lymphocytes Relative: 9 %
Lymphs Abs: 0.6 10*3/uL — ABNORMAL LOW (ref 0.7–4.0)
MCH: 24.9 pg — ABNORMAL LOW (ref 26.0–34.0)
MCHC: 31.2 g/dL (ref 30.0–36.0)
MCV: 79.7 fL — ABNORMAL LOW (ref 80.0–100.0)
Monocytes Absolute: 0.1 10*3/uL (ref 0.1–1.0)
Monocytes Relative: 1 %
Neutro Abs: 6 10*3/uL (ref 1.7–7.7)
Neutrophils Relative %: 89 %
Platelets: 271 10*3/uL (ref 150–400)
RBC: 3.5 MIL/uL — ABNORMAL LOW (ref 4.22–5.81)
RDW: 15.4 % (ref 11.5–15.5)
WBC: 6.7 10*3/uL (ref 4.0–10.5)
nRBC: 0 % (ref 0.0–0.2)

## 2021-04-25 LAB — COMPREHENSIVE METABOLIC PANEL
ALT: 16 U/L (ref 0–44)
AST: 26 U/L (ref 15–41)
Albumin: 3.4 g/dL — ABNORMAL LOW (ref 3.5–5.0)
Alkaline Phosphatase: 86 U/L (ref 38–126)
Anion gap: 8 (ref 5–15)
BUN: 18 mg/dL (ref 8–23)
CO2: 24 mmol/L (ref 22–32)
Calcium: 9.1 mg/dL (ref 8.9–10.3)
Chloride: 106 mmol/L (ref 98–111)
Creatinine, Ser: 1.2 mg/dL (ref 0.61–1.24)
GFR, Estimated: >60 mL/min (ref >60)
Glucose, Bld: 209 mg/dL — ABNORMAL HIGH (ref 70–99)
Potassium: 4.1 mmol/L (ref 3.5–5.1)
Sodium: 138 mmol/L (ref 135–145)
Total Bilirubin: 0.6 mg/dL (ref 0.3–1.2)
Total Protein: 7.2 g/dL (ref 6.5–8.1)

## 2021-04-25 LAB — CBG MONITORING, ED
Glucose-Capillary: 235 mg/dL — ABNORMAL HIGH (ref 70–99)
Glucose-Capillary: 157 mg/dL — ABNORMAL HIGH (ref 70–99)

## 2021-04-25 LAB — C-REACTIVE PROTEIN: CRP: 7.6 mg/dL — ABNORMAL HIGH (ref <1.0)

## 2021-04-25 LAB — GLUCOSE, CAPILLARY
Glucose-Capillary: 133 mg/dL — ABNORMAL HIGH (ref 70–99)
Glucose-Capillary: 176 mg/dL — ABNORMAL HIGH (ref 70–99)

## 2021-04-25 LAB — TROPONIN I (HIGH SENSITIVITY): Troponin I (High Sensitivity): 121 ng/L (ref <18)

## 2021-04-25 MED ORDER — IPRATROPIUM-ALBUTEROL 20-100 MCG/ACT IN AERS
1.0000 | INHALATION_SPRAY | RESPIRATORY_TRACT | Status: DC
  Administered 2021-04-25 – 2021-04-28 (×17): 1 via RESPIRATORY_TRACT
  Filled 2021-04-25: qty 4

## 2021-04-25 MED ORDER — HYDRALAZINE HCL 20 MG/ML IJ SOLN
10.0000 mg | INTRAMUSCULAR | Status: DC | PRN
  Administered 2021-04-25: 10 mg via INTRAVENOUS
  Filled 2021-04-25: qty 1

## 2021-04-25 MED ORDER — GABAPENTIN 300 MG PO CAPS
900.0000 mg | ORAL_CAPSULE | Freq: Every day | ORAL | Status: DC
  Administered 2021-04-25 – 2021-04-27 (×3): 900 mg via ORAL
  Filled 2021-04-25 (×3): qty 3

## 2021-04-25 MED ORDER — GABAPENTIN 300 MG PO CAPS
300.0000 mg | ORAL_CAPSULE | Freq: Every day | ORAL | Status: DC
  Administered 2021-04-26 – 2021-04-27 (×2): 300 mg via ORAL
  Filled 2021-04-25 (×2): qty 1

## 2021-04-25 MED ORDER — GABAPENTIN 300 MG PO CAPS
600.0000 mg | ORAL_CAPSULE | Freq: Every day | ORAL | Status: DC
  Administered 2021-04-26 – 2021-04-28 (×3): 600 mg via ORAL
  Filled 2021-04-25 (×3): qty 2

## 2021-04-25 MED ORDER — GABAPENTIN 300 MG PO CAPS
300.0000 mg | ORAL_CAPSULE | ORAL | Status: DC
Start: 2021-04-25 — End: 2021-04-25

## 2021-04-25 MED ORDER — IPRATROPIUM-ALBUTEROL 0.5-2.5 (3) MG/3ML IN SOLN
3.0000 mL | RESPIRATORY_TRACT | Status: DC
  Administered 2021-04-25 (×2): 3 mL via RESPIRATORY_TRACT
  Filled 2021-04-25 (×2): qty 3

## 2021-04-25 MED ORDER — ASPIRIN 81 MG PO CHEW
81.0000 mg | CHEWABLE_TABLET | Freq: Every day | ORAL | Status: DC
  Administered 2021-04-25 – 2021-04-28 (×4): 81 mg via ORAL
  Filled 2021-04-25 (×5): qty 1

## 2021-04-25 MED ORDER — LOSARTAN POTASSIUM 50 MG PO TABS
50.0000 mg | ORAL_TABLET | Freq: Every day | ORAL | Status: DC
  Administered 2021-04-25 – 2021-04-28 (×4): 50 mg via ORAL
  Filled 2021-04-25 (×4): qty 1

## 2021-04-25 NOTE — Progress Notes (Signed)
OT Cancellation Note  Patient Details Name: [name] Parks MRN: [id number] DOB: [birth year]   Cancelled Treatment:    Reason Eval/Treat Not Completed: Other (comment). Consult received, chart reviewed. Pt working with PT. Will re-attempt at later date/time as pt is available.   Arman Filter., MPH, MS, OTR/L ascom [phone] 04/25/21, 4:22 PM

## 2021-04-25 NOTE — Evaluation (Signed)
Physical Therapy Evaluation Patient Details Name: [name] Parks MRN: [id number] DOB: Mar 03, 1946 Today's Date: 04/25/2021  History of Present Illness  74 y.o. male with medical history significant for hypertension, anxiety, depression, COPD, tobacco abuse, insulin-dependent diabetes type 2, who presents emergency department for chief concerns of shortness of breath for 2 days. Pt tested positive with COVID-19.   Clinical Impression  Pt received supine in bed, pleasant and agreeable to therapy.  He lives in a single story home with his wife and has 5 STE with B railing. His wife assists with most ADLs and IADLs. Pt does have a history of falling with 4-5 falls over the past 6 months.   Pt performed bed mobility Mod I. STS required increased effort and time as pt was determined to stand on his own - CGA provided all times for safety. He then ambulated within his room 22ft using RW with mild knee buckling in bilateral knees; pt was able to maintain balance with BUE support and CGA of therapist. Pt stood in bathroom for ~2 minutes for a standing void before ending session in bed per pt request. Pt states he will sit in recliner tomorrow. Pt states strength has significantly improved since admission yesterday and pt feels like he is close to his baseline. Pt does demo functional weakness, decreased activity tolerance and balance deficits. PT rec HHPT to address above listed. Would benefit from skilled PT to address above deficits and promote optimal return to PLOF.      Recommendations for follow up therapy are one component of a multi-disciplinary discharge planning process, led by the attending physician.  Recommendations may be updated based on patient status, additional functional criteria and insurance authorization.  Follow Up Recommendations Home health PT    Assistance Recommended at Discharge Intermittent Supervision/Assistance  Functional Status Assessment Patient has had a recent decline  in their functional status and demonstrates the ability to make significant improvements in function in a reasonable and predictable amount of time.  Equipment Recommendations  None recommended by PT    Recommendations for Other Services       Precautions / Restrictions Precautions Precautions: Fall Restrictions Weight Bearing Restrictions: No      Mobility  Bed Mobility Overal bed mobility: Modified Independent             General bed mobility comments: supine<>sit    Transfers Overall transfer level: Needs assistance   Transfers: Sit to/from Stand Sit to Stand: Min guard           General transfer comment: CGA for mild steadying assist once standing. Pt preferred to reach forward for sink counter and pull to standing asking PT not to assist with lift.    Ambulation/Gait Ambulation/Gait assistance: Min guard Gait Distance (Feet): 20 Feet     Gait velocity: decreased     General Gait Details: 57ft +36ft with standing (toileting) break between. Mild weakness and knee buckling noted initially however pt able to correct and maintain balance with RW. CGA at all times for safety.  Stairs            Wheelchair Mobility    Modified Rankin (Stroke Patients Only)       Balance Overall balance assessment: Needs assistance Sitting-balance support: Feet supported;No upper extremity supported Sitting balance-Leahy Scale: Normal     Standing balance support: Bilateral upper extremity supported;During functional activity;Reliant on assistive device for balance Standing balance-Leahy Scale: Poor Standing balance comment: mild knee buckling requiring UE support of RW and CGA  Pertinent Vitals/Pain Pain Assessment: No/denies pain    Home Living Family/patient expects to be discharged to:: Private residence Living Arrangements: Spouse/significant other Available Help at Discharge: Family;Available 24 hours/day Type  of Home: Mobile home Home Access: Stairs to enter Entrance Stairs-Rails: Can reach both Entrance Stairs-Number of Steps: 5   Home Layout: One level Home Equipment: Shower seat - built in;Cane - single Librarian, academic (2 wheels);Rollator (4 wheels) Additional Comments: uses cane from car to door; uses Rollator within the home    Prior Function Prior Level of Function : Needs assist       Physical Assist : Mobility (physical);ADLs (physical) Mobility (physical): Stairs ADLs (physical): Bathing;Dressing;Toileting;IADLs Mobility Comments: Wife present during gait and stairs. Pt does report 4-5 falls in the past 6 months due to weakness. ADLs Comments: Wife assists with ADLs and IADLs.     Hand Dominance        Extremity/Trunk Assessment   Upper Extremity Assessment Upper Extremity Assessment: Defer to OT evaluation    Lower Extremity Assessment Lower Extremity Assessment: Generalized weakness (global weakness, no focal deficits)       Communication   Communication: No difficulties  Cognition Arousal/Alertness: Awake/alert Behavior During Therapy: WFL for tasks assessed/performed Overall Cognitive Status: Within Functional Limits for tasks assessed                                 General Comments: Pleasant        General Comments      Exercises     Assessment/Plan    PT Assessment Patient needs continued PT services  PT Problem List Decreased strength;Decreased mobility;Decreased activity tolerance;Decreased balance       PT Treatment Interventions Therapeutic activities;DME instruction;Gait training;Therapeutic exercise;Stair training;Balance training;Functional mobility training;Neuromuscular re-education;Patient/family education    PT Goals (Current goals can be found in the Care Plan section)  Acute Rehab PT Goals Patient Stated Goal: to go home PT Goal Formulation: With patient Time For Goal Achievement: 05/09/21 Potential to  Achieve Goals: Good    Frequency Min 2X/week   Barriers to discharge        Co-evaluation               AM-PAC PT "6 Clicks" Mobility  Outcome Measure Help needed turning from your back to your side while in a flat bed without using bedrails?: None Help needed moving from lying on your back to sitting on the side of a flat bed without using bedrails?: None Help needed moving to and from a bed to a chair (including a wheelchair)?: A Little Help needed standing up from a chair using your arms (e.g., wheelchair or bedside chair)?: A Little Help needed to walk in hospital room?: A Little Help needed climbing 3-5 steps with a railing? : A Little 6 Click Score: 20    End of Session Equipment Utilized During Treatment: Gait belt Activity Tolerance: Patient tolerated treatment well;Patient limited by fatigue Patient left: in bed;with call bell/phone within reach;with bed alarm set Nurse Communication: Mobility status PT Visit Diagnosis: Unsteadiness on feet (R26.81);Other abnormalities of gait and mobility (R26.89);Muscle weakness (generalized) (M62.81);Difficulty in walking, not elsewhere classified (R26.2);History of falling (Z91.81)    Time: 1600-1630 PT Time Calculation (min) (ACUTE ONLY): 30 min   Charges:   PT Evaluation $PT Eval Moderate Complexity: 1 Mod PT Treatments $Therapeutic Activity: 8-22 mins        Basilia Jumbo PT, DPT 04/25/21 5:55 PM  [phone]   

## 2021-04-25 NOTE — Progress Notes (Signed)
PROGRESS NOTE    [name] Parks  [id number] DOB: Sep 22, 1945 DOA: 04/24/2021 PCP: [name], [place], MD    Brief Narrative:  75 y.o. male with medical history significant for hypertension, anxiety, depression, COPD, tobacco abuse, insulin-dependent diabetes type 2, who presents emergency department for chief concerns of shortness of breath for 2 days.   At bedside he is able to tell me his name, age, current location of hospital.  He was sleeping when I entered the room.  He was able to participate in the HPI once I woke him up.   He reports that he has had shortness of breath the last 2 to 3 days.  He denies known sick contacts.  He denies any subjective fever and chills, however he states he does not know as he did not check his temperature.  He denies new cough.   Of note, he reports one episode of passing out on 04/23/21. He does not know how long he was out. He denies chest pain, fever, loss of appetite, weight changes, dysuria, dysphagia, hematuria, diarrhea. His last BM was 11/30 and it was normal brown.    He reports the shortness of breath is worsening and worse with exertion. He states he has never felt this short of breath before and that this is different from his prior episode of COPD exacerbation.  Assessment & Plan:   Principal Problem:   Acute hypoxemic respiratory failure due to COVID-19 [facility]) Active Problems:   Diabetes mellitus (HCC)   Hypertension   Hyperlipidemia   SOB (shortness of breath)   Tobacco abuse   Elevated troponin   COPD (chronic obstructive pulmonary disease) (HCC)   Sepsis (HCC)   Encephalopathy due to COVID-19 virus  Acute hypoxic respiratory failure secondary to COVID-19 COVID-19 positive on PCR with patient endorsement of syncope on 11/30 Patient meets sepsis criteria with the very definition, in setting of increased heart rate, respiration rate, organ involvement of cardiac and pulmonary, source of COVID-19 pneumonia Plan: Remdesivir,  pharmacy dosing Continue IV Solu-Medrol Supplemental oxygen if necessary Antitussives as needed Flutter valve incentive spirometer Respiratory precautions   Syncopal event with sinus tachycardia CTA negative, PE ruled out Therapy evaluations as able   Elevated troponin No significant delta Low suspicion for ACS Pulmonary embolism ruled out Suspect supply demand ischemia   Hypertension PTA amlodipine 5 mg daily, metoprolol titrate 50 mg twice daily As needed IV hydralazine   Insulin-dependent diabetes mellitus - Resumed long-acting 25 units nightly - Insulin SSI with at bedtime coverage ordered for steroid dosing - Goal inpatient glucose level is 140 to 180   COPD resumed home maintenance inhalers combination - Albuterol 2 puff inhalation every 4 hours as needed for wheezing and shortness of breath resumed - DuoNebs 3 times daily as needed for wheezing and shortness of breath   CKD 3A/2 - Serum creatinine on presentation is 1.31, GFR of 57 - Baseline in the last year serum creatinine was 1.13-1.24, GFR 57-60    Tobacco use disorder nicotine patch as needed ordered - Greater than 3 minutes spent counseling patient on tobacco cessation   Depression/anxiety-resumed paroxetine 40 mg p.o. daily   BPH-Flomax resumed   GERD-PPI   DVT prophylaxis: SQ Lovenox Code Status: Full Family Communication: Attempted to call spouse Latrel Luongo [phone] on 12/2.  Phone off, voicemail not set up Disposition Plan: Status is: Inpatient  Remains inpatient appropriate because: Shortness of breath due to symptomatic COVID infection.  Hemodynamically stable.  Anticipate discharge in 24 to 48 hours  Level of care: Telemetry Medical  Consultants:  None  Procedures:  None  Antimicrobials: Remdesivir   Subjective: Patient seen and examined.  Report symptomatic improvement since admission.  No pain complaints  Objective: Vitals:   04/25/21 0900 04/25/21 1130  04/25/21 1200 04/25/21 1230  BP: 135/70 124/63 135/61 113/61  Pulse: 84 65 72 67  Resp: 18 14 19 20   Temp:      TempSrc:      SpO2: 97% 98% 95% 98%  Weight:      Height:        Intake/Output Summary (Last 24 hours) at 04/25/2021 1414 Last data filed at 04/24/2021 1855 Gross per 24 hour  Intake 1000 ml  Output --  Net 1000 ml   Filed Weights   04/24/21 1615  Weight: 83.5 kg    Examination:  General exam: Appears calm and comfortable  Respiratory system: Coarse breath sounds bilaterally.  Normal work of breathing.  Room air Cardiovascular system: S1-S2, RRR, no murmurs, no pedal edema Gastrointestinal system: Abdomen is nondistended, soft and nontender. No organomegaly or masses felt. Normal bowel sounds heard. Central nervous system: Alert and oriented. No focal neurological deficits. Extremities: Symmetric 5 x 5 power. Skin: No rashes, lesions or ulcers Psychiatry: Judgement and insight appear normal. Mood & affect appropriate.     Data Reviewed: I have personally reviewed following labs and imaging studies  CBC: Recent Labs  Lab 04/24/21 1613 04/25/21 0604  WBC 9.5 6.7  NEUTROABS 6.9 6.0  HGB 8.5* 8.7*  HCT 27.4* 27.9*  MCV 80.8 79.7*  PLT 266 271   Basic Metabolic Panel: Recent Labs  Lab 04/24/21 1754 04/25/21 0604  NA 135 138  K 3.6 4.1  CL 105 106  CO2 24 24  GLUCOSE 101* 209*  BUN 13 18  CREATININE 1.31* 1.20  CALCIUM 8.5* 9.1   GFR: Estimated Creatinine Clearance: 56.6 mL/min (by C-G formula based on SCr of 1.2 mg/dL). Liver Function Tests: Recent Labs  Lab 04/25/21 0604  AST 26  ALT 16  ALKPHOS 86  BILITOT 0.6  PROT 7.2  ALBUMIN 3.4*   No results for input(s): LIPASE, AMYLASE in the last 168 hours. No results for input(s): AMMONIA in the last 168 hours. Coagulation Profile: No results for input(s): INR, PROTIME in the last 168 hours. Cardiac Enzymes: No results for input(s): CKTOTAL, CKMB, CKMBINDEX, TROPONINI in the last 168  hours. BNP (last 3 results) No results for input(s): PROBNP in the last 8760 hours. HbA1C: No results for input(s): HGBA1C in the last 72 hours. CBG: Recent Labs  Lab 04/24/21 2108 04/25/21 0930 04/25/21 1149  GLUCAP 111* 235* 157*   Lipid Profile: No results for input(s): CHOL, HDL, LDLCALC, TRIG, CHOLHDL, LDLDIRECT in the last 72 hours. Thyroid Function Tests: No results for input(s): TSH, T4TOTAL, FREET4, T3FREE, THYROIDAB in the last 72 hours. Anemia Panel: No results for input(s): VITAMINB12, FOLATE, FERRITIN, TIBC, IRON, RETICCTPCT in the last 72 hours. Sepsis Labs: Recent Labs  Lab 04/24/21 2129  PROCALCITON <0.10    Recent Results (from the past 240 hour(s))  Resp Panel by RT-PCR (Flu A&B, Covid) Nasopharyngeal Swab     Status: Abnormal   Collection Time: 04/24/21  4:36 PM   Specimen: Nasopharyngeal Swab; Nasopharyngeal(NP) swabs in vial transport medium  Result Value Ref Range Status   SARS Coronavirus 2 by RT PCR POSITIVE (A) NEGATIVE Final    Comment: CRITICAL RESULT CALLED TO, READ BACK BY AND VERIFIED WITH: MELLISSA SCOTT 1801 04/24/21 MU  (  NOTE) SARS-CoV-2 target nucleic acids are DETECTED.  The SARS-CoV-2 RNA is generally detectable in upper respiratory specimens during the acute phase of infection. Positive results are indicative of the presence of the identified virus, but do not rule out bacterial infection or co-infection with other pathogens not detected by the test. Clinical correlation with patient history and other diagnostic information is necessary to determine patient infection status. The expected result is Negative.  Fact Sheet for Patients: BloggerCourse.com  Fact Sheet for Healthcare Providers: SeriousBroker.it  This test is not yet approved or cleared by the [place] FDA and  has been authorized for detection and/or diagnosis of SARS-CoV-2 by FDA under an Emergency Use  Authorization (EUA).  This EUA will remain in effect (meaning this test  can be used) for the duration of  the COVID-19 declaration under Section 564(b)(1) of the Act, 21 U.S.C. section 360bbb-3(b)(1), unless the authorization is terminated or revoked sooner.     Influenza A by PCR NEGATIVE NEGATIVE Final   Influenza B by PCR NEGATIVE NEGATIVE Final    Comment: (NOTE) The Xpert Xpress SARS-CoV-2/FLU/RSV plus assay is intended as an aid in the diagnosis of influenza from Nasopharyngeal swab specimens and should not be used as a sole basis for treatment. Nasal washings and aspirates are unacceptable for Xpert Xpress SARS-CoV-2/FLU/RSV testing.  Fact Sheet for Patients: BloggerCourse.com  Fact Sheet for Healthcare Providers: SeriousBroker.it  This test is not yet approved or cleared by the [place] FDA and has been authorized for detection and/or diagnosis of SARS-CoV-2 by FDA under an Emergency Use Authorization (EUA). This EUA will remain in effect (meaning this test can be used) for the duration of the COVID-19 declaration under Section 564(b)(1) of the Act, 21 U.S.C. section 360bbb-3(b)(1), unless the authorization is terminated or revoked.  Performed at [facility], [address], [place], [place] [address]          Radiology Studies: DG Chest 2 View  Result Date: 04/24/2021 CLINICAL DATA:  Short of breath EXAM: CHEST - 2 VIEW COMPARISON:  04/24/2020 FINDINGS: COPD. Emphysema. Reticular markings in the bases most consistent with chronic scarring. Negative for acute infiltrate or effusion. Atherosclerotic calcification aortic arch. IMPRESSION: COPD with scarring in the bases.  No superimposed acute abnormality. Electronically Signed   By: Marlan Palau M.D.   On: 04/24/2021 16:56   CT Angio Chest Pulmonary Embolism (PE) W or WO Contrast  Result Date: 04/24/2021 CLINICAL DATA:  Shortness of breath. EXAM:  CT ANGIOGRAPHY CHEST WITH CONTRAST TECHNIQUE: Multidetector CT imaging of the chest was performed using the standard protocol during bolus administration of intravenous contrast. Multiplanar CT image reconstructions and MIPs were obtained to evaluate the vascular anatomy. CONTRAST:  71mL OMNIPAQUE IOHEXOL 350 MG/ML SOLN COMPARISON:  February 24, 2018 FINDINGS: Cardiovascular: There is marked severity calcification of the thoracic aorta, without evidence of aneurysmal dilatation or dissection. Satisfactory opacification of the pulmonary arteries to the segmental level. No evidence of pulmonary embolism. Normal heart size with marked severity coronary artery calcification. No pericardial effusion. Mediastinum/Nodes: Mild AP window, paratracheal and bilateral hilar lymphadenopathy is seen. Thyroid gland, trachea, and esophagus demonstrate no significant findings. Lungs/Pleura: There is marked severity emphysematous lung disease. A stable 4 mm peripheral, noncalcified right upper lobe lung nodule is seen (axial CT image 41, CT series 6). There is no evidence of acute infiltrate, pleural effusion or pneumothorax. Upper Abdomen: A stable 1.6 cm x 1.1 cm focus of parenchymal low attenuation is seen within the left  lobe of the liver. A stable 3.0 cm x 2.5 cm low-attenuation left adrenal mass is seen (approximately 14.08 Hounsfield units). Musculoskeletal: Multilevel degenerative changes are seen throughout the thoracic spine. Review of the MIP images confirms the above findings. IMPRESSION: 1. No evidence of pulmonary embolism. 2. Marked severity emphysematous lung disease. 3. Stable 4 mm peripheral, noncalcified right upper lobe lung nodule, likely benign. 4. 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 5. Stable focus of parenchymal low attenuation within the left lobe of the liver, which may represent a small cyst or hemangioma. Aortic Atherosclerosis (ICD10-I70.0) and Emphysema  (ICD10-J43.9). Electronically Signed   By: Virgina Norfolk M.D.   On: 04/24/2021 23:01        Scheduled Meds:  amLODipine  5 mg Oral Daily   arformoterol  15 mcg Nebulization BID   And   umeclidinium bromide  1 puff Inhalation Daily   atorvastatin  40 mg Oral QHS   enoxaparin (LOVENOX) injection  40 mg Subcutaneous QHS   insulin aspart  0-20 Units Subcutaneous TID WC   insulin aspart  0-5 Units Subcutaneous QHS   insulin glargine-yfgn  25 Units Subcutaneous QHS   ipratropium-albuterol  3 mL Nebulization Q4H   methylPREDNISolone (SOLU-MEDROL) injection  1 mg/kg Intravenous Q12H   Followed by   Derrill Memo ON 04/28/2021] predniSONE  50 mg Oral Daily   metoprolol tartrate  50 mg Oral BID   pantoprazole  40 mg Oral Daily   PARoxetine  40 mg Oral Daily   tamsulosin  0.4 mg Oral BID   Continuous Infusions:  remdesivir 100 mg in NS 100 mL Stopped (04/25/21 1235)     LOS: 0 days    Time spent: 35 minutes    Sidney Ace, MD [facility]   If 7PM-7AM, please contact night-coverage  04/25/2021, 2:14 PM

## 2021-04-26 DIAGNOSIS — U071 COVID-19: Secondary | ICD-10-CM | POA: Diagnosis not present

## 2021-04-26 DIAGNOSIS — J9601 Acute respiratory failure with hypoxia: Secondary | ICD-10-CM | POA: Diagnosis not present

## 2021-04-26 LAB — GLUCOSE, CAPILLARY
Glucose-Capillary: 219 mg/dL — ABNORMAL HIGH (ref 70–99)
Glucose-Capillary: 175 mg/dL — ABNORMAL HIGH (ref 70–99)
Glucose-Capillary: 141 mg/dL — ABNORMAL HIGH (ref 70–99)
Glucose-Capillary: 267 mg/dL — ABNORMAL HIGH (ref 70–99)

## 2021-04-26 LAB — CBC WITH DIFFERENTIAL/PLATELET
Abs Immature Granulocytes: 0.03 10*3/uL (ref 0.00–0.07)
Basophils Absolute: 0 10*3/uL (ref 0.0–0.1)
Basophils Relative: 0 %
Eosinophils Absolute: 0 10*3/uL (ref 0.0–0.5)
Eosinophils Relative: 0 %
HCT: 26.3 % — ABNORMAL LOW (ref 39.0–52.0)
Hemoglobin: 8.3 g/dL — ABNORMAL LOW (ref 13.0–17.0)
Immature Granulocytes: 0 %
Lymphocytes Relative: 9 %
Lymphs Abs: 0.6 10*3/uL — ABNORMAL LOW (ref 0.7–4.0)
MCH: 25 pg — ABNORMAL LOW (ref 26.0–34.0)
MCHC: 31.6 g/dL (ref 30.0–36.0)
MCV: 79.2 fL — ABNORMAL LOW (ref 80.0–100.0)
Monocytes Absolute: 0.3 10*3/uL (ref 0.1–1.0)
Monocytes Relative: 5 %
Neutro Abs: 5.9 10*3/uL (ref 1.7–7.7)
Neutrophils Relative %: 86 %
Platelets: 306 10*3/uL (ref 150–400)
RBC: 3.32 MIL/uL — ABNORMAL LOW (ref 4.22–5.81)
RDW: 15.7 % — ABNORMAL HIGH (ref 11.5–15.5)
WBC: 6.9 10*3/uL (ref 4.0–10.5)
nRBC: 0 % (ref 0.0–0.2)

## 2021-04-26 LAB — COMPREHENSIVE METABOLIC PANEL
ALT: 19 U/L (ref 0–44)
AST: 34 U/L (ref 15–41)
Albumin: 3.3 g/dL — ABNORMAL LOW (ref 3.5–5.0)
Alkaline Phosphatase: 72 U/L (ref 38–126)
Anion gap: 10 (ref 5–15)
BUN: 26 mg/dL — ABNORMAL HIGH (ref 8–23)
CO2: 25 mmol/L (ref 22–32)
Calcium: 8.9 mg/dL (ref 8.9–10.3)
Chloride: 103 mmol/L (ref 98–111)
Creatinine, Ser: 1.37 mg/dL — ABNORMAL HIGH (ref 0.61–1.24)
GFR, Estimated: 54 mL/min — ABNORMAL LOW (ref >60)
Glucose, Bld: 235 mg/dL — ABNORMAL HIGH (ref 70–99)
Potassium: 4.2 mmol/L (ref 3.5–5.1)
Sodium: 138 mmol/L (ref 135–145)
Total Bilirubin: 0.5 mg/dL (ref 0.3–1.2)
Total Protein: 6.9 g/dL (ref 6.5–8.1)

## 2021-04-26 LAB — C-REACTIVE PROTEIN: CRP: 4.6 mg/dL — ABNORMAL HIGH (ref <1.0)

## 2021-04-26 MED ORDER — POLYETHYLENE GLYCOL 3350 17 G PO PACK
17.0000 g | PACK | Freq: Every day | ORAL | Status: DC
  Administered 2021-04-26 – 2021-04-28 (×3): 17 g via ORAL
  Filled 2021-04-26 (×3): qty 1

## 2021-04-26 MED ORDER — BISACODYL 10 MG RE SUPP: 10.0000 mg | Freq: Every day | RECTAL | Status: DC | PRN

## 2021-04-26 MED ORDER — SENNOSIDES-DOCUSATE SODIUM 8.6-50 MG PO TABS
1.0000 | ORAL_TABLET | Freq: Two times a day (BID) | ORAL | Status: DC
  Administered 2021-04-27 – 2021-04-28 (×4): 1 via ORAL
  Filled 2021-04-26 (×5): qty 1

## 2021-04-26 NOTE — Evaluation (Signed)
Occupational Therapy Evaluation Patient Details Name: [name] Parks MRN: [id number] DOB: [birth year] Today's Date: 04/26/2021   History of Present Illness 75 y.o. male with medical history significant for hypertension, anxiety, depression, COPD, tobacco abuse, insulin-dependent diabetes type 2, who presents emergency department for chief concerns of shortness of breath for 2 days. Pt tested positive with COVID-19.   Clinical Impression   Pt seen for OT evaluation this date in setting of acute hospitalization d/t COVID. Pt reports being INDEP for self care and intermittently using cane for fxl mobility at baseline. Pt presents this date with decreased fxl activity tolerance. He currently requires:  SETUP for seated UB ADLs, MIN A for LB ADLs including donning socks in sitting (already diffcult at baseline d/t h/o knee surgeries, but increased difficulty with current SOB as well). CGA for ADL transfers with RW. Cues for pacing/EC. Will continue to follow acutely. Anticipate he can d/c home with HHOT f/u to ensure safety and offer energy conservation modifications in the home.      Recommendations for follow up therapy are one component of a multi-disciplinary discharge planning process, led by the attending physician.  Recommendations may be updated based on patient status, additional functional criteria and insurance authorization.   Follow Up Recommendations  Home health OT    Assistance Recommended at Discharge Intermittent Supervision/Assistance  Functional Status Assessment  Patient has had a recent decline in their functional status and demonstrates the ability to make significant improvements in function in a reasonable and predictable amount of time.  Equipment Recommendations  BSC/3in1;Tub/shower seat    Recommendations for Other Services       Precautions / Restrictions Precautions Precautions: Fall Restrictions Weight Bearing Restrictions: No      Mobility Bed  Mobility Overal bed mobility: Modified Independent             General bed mobility comments: supine<>sit    Transfers Overall transfer level: Needs assistance   Transfers: Sit to/from Stand Sit to Stand: Min guard           General transfer comment: CGA for steadying, cues for use of RW, also trialed w/o RW, but pt noted to be reaching for some sort of UE support when he comes to standing.      Balance Overall balance assessment: Needs assistance Sitting-balance support: Feet supported;No upper extremity supported Sitting balance-Leahy Scale: Normal     Standing balance support: Bilateral upper extremity supported;During functional activity;Reliant on assistive device for balance Standing balance-Leahy Scale: Poor Standing balance comment: F with static standing, P with fxl mobility d/t knee buckling                           ADL either performed or assessed with clinical judgement   ADL                                         General ADL Comments: requires SETUP for seated UB ADLs, MIN A for LB ADLs including donning socks in sitting (already diffcult at baseline d/t h/o knee surgeries, but increased difficulty with current SOB as well). CGA for ADL transfers with RW. Cues for pacing/EC.     Vision         Perception     Praxis      Pertinent Vitals/Pain Pain Assessment: No/denies pain     Hand Dominance Right  Extremity/Trunk Assessment Upper Extremity Assessment Upper Extremity Assessment: Overall WFL for tasks assessed;Generalized weakness (ROM WFL. MMT grossly 4-/5)   Lower Extremity Assessment Lower Extremity Assessment: Defer to PT evaluation;Generalized weakness       Communication Communication Communication: No difficulties   Cognition Arousal/Alertness: Awake/alert Behavior During Therapy: WFL for tasks assessed/performed Overall Cognitive Status: No family/caregiver present to determine baseline cognitive  functioning                                 General Comments: Pt is oriented to self, place, and situation but not time. Able to follow all commands, slightly HOH, slightly increased processing time.     General Comments       Exercises Other Exercises Other Exercises: OT engages pt in education re: role   Shoulder Instructions      Home Living Family/patient expects to be discharged to:: Private residence Living Arrangements: Spouse/significant other Available Help at Discharge: Family;Available 24 hours/day Type of Home: Mobile home Home Access: Stairs to enter Entrance Stairs-Number of Steps: 5 Entrance Stairs-Rails: Can reach both Home Layout: One level     Bathroom Shower/Tub: Estate manager/land agent Accessibility: Yes   Home Equipment: Shower seat - built in;Cane - single Librarian, academic (2 wheels);Rollator (4 wheels)   Additional Comments: uses cane from car to door; uses Rollator within the home      Prior Functioning/Environment Prior Level of Function : Needs assist       Physical Assist : Mobility (physical);ADLs (physical) Mobility (physical): Stairs ADLs (physical): Bathing;Dressing;Toileting;IADLs Mobility Comments: Wife present during gait and stairs. Pt does report 4-5 falls in the past 6 months due to weakness. ADLs Comments: Wife assists with ADLs and IADLs.        OT Problem List: Decreased strength;Decreased activity tolerance;Cardiopulmonary status limiting activity      OT Treatment/Interventions: Self-care/ADL training;Therapeutic exercise;DME and/or AE instruction;Therapeutic activities;Patient/family education;Balance training    OT Goals(Current goals can be found in the care plan section) Acute Rehab OT Goals Patient Stated Goal: to get better OT Goal Formulation: With patient Time For Goal Achievement: 05/10/21 Potential to Achieve Goals: Good ADL Goals Pt Will Perform Lower Body Dressing: with modified  independence Pt Will Transfer to Toilet: with modified independence;ambulating;grab bars Pt Will Perform Toileting - Clothing Manipulation and hygiene: with modified independence;sit to/from stand  OT Frequency: Min 2X/week   Barriers to D/C:            Co-evaluation              AM-PAC OT "6 Clicks" Daily Activity     Outcome Measure Help from another person eating meals?: None Help from another person taking care of personal grooming?: A Little Help from another person toileting, which includes using toliet, bedpan, or urinal?: A Little Help from another person bathing (including washing, rinsing, drying)?: A Little Help from another person to put on and taking off regular upper body clothing?: None Help from another person to put on and taking off regular lower body clothing?: A Little 6 Click Score: 20   End of Session Equipment Utilized During Treatment: Gait belt;Rolling walker (2 wheels) Nurse Communication: Mobility status;Other (comment) (o2)  Activity Tolerance: Patient tolerated treatment well Patient left: with call bell/phone within reach;Other (comment) (EOB sitting with CNA presenting to take VS, noted that pt at 100% on 3Lnc so titrated to 2L.)  OT Visit Diagnosis: Unsteadiness  on feet (R26.81);Muscle weakness (generalized) (M62.81)                Time: 4196-2229 OT Time Calculation (min): 15 min Charges:  OT General Charges $OT Visit: 1 Visit OT Evaluation $OT Eval Moderate Complexity: 1 [address] [place], OTR/L ascom [phone] 04/26/21, 4:54 PM

## 2021-04-26 NOTE — Progress Notes (Signed)
PROGRESS NOTE    [name] Parks  [id number] DOB: 08/09/1945 DOA: 04/24/2021 PCP: [name], [place], MD    Brief Narrative:  75 y.o. male with medical history significant for hypertension, anxiety, depression, COPD, tobacco abuse, insulin-dependent diabetes type 2, who presents emergency department for chief concerns of shortness of breath for 2 days.   At bedside he is able to tell me his name, age, current location of hospital.  He was sleeping when I entered the room.  He was able to participate in the HPI once I woke him up.   He reports that he has had shortness of breath the last 2 to 3 days.  He denies known sick contacts.  He denies any subjective fever and chills, however he states he does not know as he did not check his temperature.  He denies new cough.   Of note, he reports one episode of passing out on 04/23/21. He does not know how long he was out. He denies chest pain, fever, loss of appetite, weight changes, dysuria, dysphagia, hematuria, diarrhea. His last BM was 11/30 and it was normal brown.    He reports the shortness of breath is worsening and worse with exertion. He states he has never felt this short of breath before and that this is different from his prior episode of COPD exacerbation.  Assessment & Plan:   Principal Problem:   Acute hypoxemic respiratory failure due to COVID-19 [facility]) Active Problems:   Diabetes mellitus (HCC)   Hypertension   Hyperlipidemia   SOB (shortness of breath)   Tobacco abuse   Elevated troponin   COPD (chronic obstructive pulmonary disease) (HCC)   Sepsis (HCC)   Encephalopathy due to COVID-19 virus  Acute hypoxic respiratory failure secondary to COVID-19 COVID-19 positive on PCR with patient endorsement of syncope on 11/30 Patient meets sepsis criteria with the very definition, in setting of increased heart rate, respiration rate, organ involvement of cardiac and pulmonary, source of COVID-19 pneumonia Plan: Remdesivir,  pharmacy dosing Continue IV Solu-Medrol Transition to p.o. prednisone within next 24 hours Supplemental oxygen if necessary Antitussives as needed Flutter valve incentive spirometer Respiratory precautions   Syncopal event with sinus tachycardia CTA negative, PE ruled out Continue therapy evaluations Recommend home with home health, [facility] consult Home health orders placed   Elevated troponin No significant delta Low suspicion for ACS Pulmonary embolism ruled out Suspect supply demand ischemia   Hypertension PTA amlodipine 5 mg daily, metoprolol titrate 50 mg twice daily As needed IV hydralazine   Insulin-dependent diabetes mellitus - Resumed long-acting 25 units nightly - Insulin SSI with at bedtime coverage ordered for steroid dosing - Goal inpatient glucose level is 140 to 180   COPD resumed home maintenance inhalers combination - Albuterol 2 puff inhalation every 4 hours as needed for wheezing and shortness of breath resumed - DuoNebs 3 times daily as needed for wheezing and shortness of breath   CKD 3A/2 - Serum creatinine on presentation is 1.31, GFR of 57 - Baseline in the last year serum creatinine was 1.13-1.24, GFR 57-60    Tobacco use disorder nicotine patch as needed ordered - Greater than 3 minutes spent counseling patient on tobacco cessation   Depression/anxiety-resumed paroxetine 40 mg p.o. daily   BPH-Flomax resumed   GERD-PPI   DVT prophylaxis: SQ Lovenox Code Status: Full Family Communication: Attempted to call spouse Marck Mallis [phone] on 12/3.  Phone off, voicemail not set up Disposition Plan: Status is: Inpatient  Remains inpatient appropriate because: Shortness  of breath due to symptomatic COVID infection.  Hemodynamically stable.  Anticipate discharge 12/4       Level of care: Telemetry Medical  Consultants:  None  Procedures:  None  Antimicrobials: Remdesivir   Subjective: Patient seen and examined.  Report  symptomatic improvement since admission.  No pain complaints  Objective: Vitals:   04/26/21 0023 04/26/21 0445 04/26/21 0454 04/26/21 0737  BP: (!) 154/72  (!) 160/81 (!) 150/72  Pulse: 67  68 67  Resp: 18  16 16   Temp: 98 F (36.7 C)  98 F (36.7 C) 98.5 F (36.9 C)  TempSrc: Oral  Oral Oral  SpO2: 98% 100% 100% 100%  Weight:      Height:        Intake/Output Summary (Last 24 hours) at 04/26/2021 0945 Last data filed at 04/26/2021 0414 Gross per 24 hour  Intake 200 ml  Output 1000 ml  Net -800 ml   Filed Weights   04/24/21 1615  Weight: 83.5 kg    Examination:  General exam: No acute distress Respiratory system: Bibasilar crackles.  Normal work of breathing.  Room air Cardiovascular system: S1-S2, RRR, no murmurs, no pedal edema Gastrointestinal system: Abdomen is nondistended, soft and nontender. No organomegaly or masses felt. Normal bowel sounds heard. Central nervous system: Alert and oriented. No focal neurological deficits. Extremities: Symmetric 5 x 5 power. Skin: No rashes, lesions or ulcers Psychiatry: Judgement and insight appear normal. Mood & affect appropriate.     Data Reviewed: I have personally reviewed following labs and imaging studies  CBC: Recent Labs  Lab 04/24/21 1613 04/25/21 0604 04/26/21 0623  WBC 9.5 6.7 6.9  NEUTROABS 6.9 6.0 5.9  HGB 8.5* 8.7* 8.3*  HCT 27.4* 27.9* 26.3*  MCV 80.8 79.7* 79.2*  PLT 266 271 [id number]   Basic Metabolic Panel: Recent Labs  Lab 04/24/21 1754 04/25/21 0604 04/26/21 0623  NA 135 138 138  K 3.6 4.1 4.2  CL 105 106 103  CO2 24 24 25   GLUCOSE 101* 209* 235*  BUN 13 18 26*  CREATININE 1.31* 1.20 1.37*  CALCIUM 8.5* 9.1 8.9   GFR: Estimated Creatinine Clearance: 49.6 mL/min (A) (by C-G formula based on SCr of 1.37 mg/dL (H)). Liver Function Tests: Recent Labs  Lab 04/25/21 0604 04/26/21 0623  AST 26 34  ALT 16 19  ALKPHOS 86 72  BILITOT 0.6 0.5  PROT 7.2 6.9  ALBUMIN 3.4* 3.3*   No  results for input(s): LIPASE, AMYLASE in the last 168 hours. No results for input(s): AMMONIA in the last 168 hours. Coagulation Profile: No results for input(s): INR, PROTIME in the last 168 hours. Cardiac Enzymes: No results for input(s): CKTOTAL, CKMB, CKMBINDEX, TROPONINI in the last 168 hours. BNP (last 3 results) No results for input(s): PROBNP in the last 8760 hours. HbA1C: No results for input(s): HGBA1C in the last 72 hours. CBG: Recent Labs  Lab 04/25/21 0930 04/25/21 1149 04/25/21 1635 04/25/21 2038 04/26/21 0738  GLUCAP 235* 157* 133* 176* 219*   Lipid Profile: No results for input(s): CHOL, HDL, LDLCALC, TRIG, CHOLHDL, LDLDIRECT in the last 72 hours. Thyroid Function Tests: No results for input(s): TSH, T4TOTAL, FREET4, T3FREE, THYROIDAB in the last 72 hours. Anemia Panel: No results for input(s): VITAMINB12, FOLATE, FERRITIN, TIBC, IRON, RETICCTPCT in the last 72 hours. Sepsis Labs: Recent Labs  Lab 04/24/21 2129  PROCALCITON <0.10    Recent Results (from the past 240 hour(s))  Resp Panel by RT-PCR (Flu A&B, Covid) Nasopharyngeal  Swab     Status: Abnormal   Collection Time: 04/24/21  4:36 PM   Specimen: Nasopharyngeal Swab; Nasopharyngeal(NP) swabs in vial transport medium  Result Value Ref Range Status   SARS Coronavirus 2 by RT PCR POSITIVE (A) NEGATIVE Final    Comment: CRITICAL RESULT CALLED TO, READ BACK BY AND VERIFIED WITH: MELLISSA SCOTT 1801 04/24/21 MU  (NOTE) SARS-CoV-2 target nucleic acids are DETECTED.  The SARS-CoV-2 RNA is generally detectable in upper respiratory specimens during the acute phase of infection. Positive results are indicative of the presence of the identified virus, but do not rule out bacterial infection or co-infection with other pathogens not detected by the test. Clinical correlation with patient history and other diagnostic information is necessary to determine patient infection status. The expected result is  Negative.  Fact Sheet for Patients: EntrepreneurPulse.com.au  Fact Sheet for Healthcare Providers: IncredibleEmployment.be  This test is not yet approved or cleared by the [place] FDA and  has been authorized for detection and/or diagnosis of SARS-CoV-2 by FDA under an Emergency Use Authorization (EUA).  This EUA will remain in effect (meaning this test  can be used) for the duration of  the COVID-19 declaration under Section 564(b)(1) of the Act, 21 U.S.C. section 360bbb-3(b)(1), unless the authorization is terminated or revoked sooner.     Influenza A by PCR NEGATIVE NEGATIVE Final   Influenza B by PCR NEGATIVE NEGATIVE Final    Comment: (NOTE) The Xpert Xpress SARS-CoV-2/FLU/RSV plus assay is intended as an aid in the diagnosis of influenza from Nasopharyngeal swab specimens and should not be used as a sole basis for treatment. Nasal washings and aspirates are unacceptable for Xpert Xpress SARS-CoV-2/FLU/RSV testing.  Fact Sheet for Patients: EntrepreneurPulse.com.au  Fact Sheet for Healthcare Providers: IncredibleEmployment.be  This test is not yet approved or cleared by the [place] FDA and has been authorized for detection and/or diagnosis of SARS-CoV-2 by FDA under an Emergency Use Authorization (EUA). This EUA will remain in effect (meaning this test can be used) for the duration of the COVID-19 declaration under Section 564(b)(1) of the Act, 21 U.S.C. section 360bbb-3(b)(1), unless the authorization is terminated or revoked.  Performed at [facility], [address] [place], [place] [address]          Radiology Studies: DG Chest 2 View  Result Date: 04/24/2021 CLINICAL DATA:  Short of breath EXAM: CHEST - 2 VIEW COMPARISON:  04/24/2020 FINDINGS: COPD. Emphysema. Reticular markings in the bases most consistent with chronic scarring. Negative for acute infiltrate or  effusion. Atherosclerotic calcification aortic arch. IMPRESSION: COPD with scarring in the bases.  No superimposed acute abnormality. Electronically Signed   By: Franchot Gallo M.D.   On: 04/24/2021 16:56   CT Angio Chest Pulmonary Embolism (PE) W or WO Contrast  Result Date: 04/24/2021 CLINICAL DATA:  Shortness of breath. EXAM: CT ANGIOGRAPHY CHEST WITH CONTRAST TECHNIQUE: Multidetector CT imaging of the chest was performed using the standard protocol during bolus administration of intravenous contrast. Multiplanar CT image reconstructions and MIPs were obtained to evaluate the vascular anatomy. CONTRAST:  61mL OMNIPAQUE IOHEXOL 350 MG/ML SOLN COMPARISON:  February 24, 2018 FINDINGS: Cardiovascular: There is marked severity calcification of the thoracic aorta, without evidence of aneurysmal dilatation or dissection. Satisfactory opacification of the pulmonary arteries to the segmental level. No evidence of pulmonary embolism. Normal heart size with marked severity coronary artery calcification. No pericardial effusion. Mediastinum/Nodes: Mild AP window, paratracheal and bilateral hilar lymphadenopathy is seen. Thyroid gland, trachea,  and esophagus demonstrate no significant findings. Lungs/Pleura: There is marked severity emphysematous lung disease. A stable 4 mm peripheral, noncalcified right upper lobe lung nodule is seen (axial CT image 41, CT series 6). There is no evidence of acute infiltrate, pleural effusion or pneumothorax. Upper Abdomen: A stable 1.6 cm x 1.1 cm focus of parenchymal low attenuation is seen within the left lobe of the liver. A stable 3.0 cm x 2.5 cm low-attenuation left adrenal mass is seen (approximately 14.08 Hounsfield units). Musculoskeletal: Multilevel degenerative changes are seen throughout the thoracic spine. Review of the MIP images confirms the above findings. IMPRESSION: 1. No evidence of pulmonary embolism. 2. Marked severity emphysematous lung disease. 3. Stable 4 mm  peripheral, noncalcified right upper lobe lung nodule, likely benign. 4. 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 5. Stable focus of parenchymal low attenuation within the left lobe of the liver, which may represent a small cyst or hemangioma. Aortic Atherosclerosis (ICD10-I70.0) and Emphysema (ICD10-J43.9). Electronically Signed   By: Aram Candela M.D.   On: 04/24/2021 23:01        Scheduled Meds:  amLODipine  5 mg Oral Daily   arformoterol  15 mcg Nebulization BID   And   umeclidinium bromide  1 puff Inhalation Daily   aspirin  81 mg Oral Daily   atorvastatin  40 mg Oral QHS   enoxaparin (LOVENOX) injection  40 mg Subcutaneous QHS   gabapentin  300 mg Oral Q1200   gabapentin  600 mg Oral Daily   gabapentin  900 mg Oral QHS   insulin aspart  0-20 Units Subcutaneous TID WC   insulin aspart  0-5 Units Subcutaneous QHS   insulin glargine-yfgn  25 Units Subcutaneous QHS   Ipratropium-Albuterol  1 puff Inhalation Q4H   losartan  50 mg Oral Daily   methylPREDNISolone (SOLU-MEDROL) injection  1 mg/kg Intravenous Q12H   Followed by   Melene Muller ON 04/28/2021] predniSONE  50 mg Oral Daily   metoprolol tartrate  50 mg Oral BID   pantoprazole  40 mg Oral Daily   PARoxetine  40 mg Oral Daily   tamsulosin  0.4 mg Oral BID   Continuous Infusions:  remdesivir 100 mg in NS 100 mL Stopped (04/25/21 1235)     LOS: 1 day    Time spent: 25 minutes    Tresa Moore, MD [facility]   If 7PM-7AM, please contact night-coverage  04/26/2021, 9:45 AM

## 2021-04-26 NOTE — TOC Progression Note (Signed)
Transition of Care [facility]) - Progression Note    Patient Details  Name: Aaditya Letizia MRN: [id number] Date of Birth: 11/27/1945  Transition of Care [facility]) CM/SW Contact  Ashley Royalty Lutricia Feil, RN Phone Number: 04/26/2021, 2:42 PM  Clinical Narrative:    Recommendations for HHPT. RN attempted to speak with pt at bedside however on the phone. Pt also outreached to the pt's spouse (Darla) however unable to reach and the voice mail as full.  Will continue outreach attempts for [facility] choice pending services.       Expected Discharge Plan and Services                                                 Social Determinants of Health (SDOH) Interventions    Readmission Risk Interventions No flowsheet data found.

## 2021-04-27 DIAGNOSIS — U071 COVID-19: Secondary | ICD-10-CM | POA: Diagnosis not present

## 2021-04-27 DIAGNOSIS — J9601 Acute respiratory failure with hypoxia: Secondary | ICD-10-CM | POA: Diagnosis not present

## 2021-04-27 LAB — GLUCOSE, CAPILLARY
Glucose-Capillary: 162 mg/dL — ABNORMAL HIGH (ref 70–99)
Glucose-Capillary: 190 mg/dL — ABNORMAL HIGH (ref 70–99)
Glucose-Capillary: 166 mg/dL — ABNORMAL HIGH (ref 70–99)
Glucose-Capillary: 253 mg/dL — ABNORMAL HIGH (ref 70–99)

## 2021-04-27 NOTE — Progress Notes (Signed)
PROGRESS NOTE    [name] Parks  [id number] DOB: [birth year] DOA: 04/24/2021 PCP: [name], [place], MD    Brief Narrative:  75 y.o. male with medical history significant for hypertension, anxiety, depression, COPD, tobacco abuse, insulin-dependent diabetes type 2, who presents emergency department for chief concerns of shortness of breath for 2 days.   At bedside he is able to tell me his name, age, current location of hospital.  He was sleeping when I entered the room.  He was able to participate in the HPI once I woke him up.   He reports that he has had shortness of breath the last 2 to 3 days.  He denies known sick contacts.  He denies any subjective fever and chills, however he states he does not know as he did not check his temperature.  He denies new cough.   Of note, he reports one episode of passing out on 04/23/21. He does not know how long he was out. He denies chest pain, fever, loss of appetite, weight changes, dysuria, dysphagia, hematuria, diarrhea. His last BM was 11/30 and it was normal brown.    He reports the shortness of breath is worsening and worse with exertion. He states he has never felt this short of breath before and that this is different from his prior episode of COPD exacerbation.  Assessment & Plan:   Principal Problem:   Acute hypoxemic respiratory failure due to COVID-19 [facility]) Active Problems:   Diabetes mellitus (HCC)   Hypertension   Hyperlipidemia   SOB (shortness of breath)   Tobacco abuse   Elevated troponin   COPD (chronic obstructive pulmonary disease) (HCC)   Sepsis (HCC)   Encephalopathy due to COVID-19 virus  Acute hypoxic respiratory failure secondary to COVID-19 COVID-19 positive on PCR with patient endorsement of syncope on 11/30 Patient meets sepsis criteria with the very definition, in setting of increased heart rate, respiration rate, organ involvement of cardiac and pulmonary, source of COVID-19 pneumonia Plan: Continue  remdesivir, plan for 5-day course Continue steroid regimen Supplemental oxygen if necessary Antitussives as needed Flutter valve incentive spirometer Respiratory precautions Tentative plan discharge on 12/5   Syncopal event with sinus tachycardia CTA negative, PE ruled out Continue therapy evaluations Recommend home with home health,  [facility] consult  Home health orders placed   Elevated troponin No significant delta Low suspicion for ACS Pulmonary embolism ruled out Suspect supply demand ischemia EKG as needed chest pain   Hypertension PTA amlodipine 5 mg daily, metoprolol titrate 50 mg twice daily As needed IV hydralazine   Insulin-dependent diabetes mellitus - Resumed long-acting 25 units nightly - Insulin SSI with at bedtime coverage ordered for steroid dosing - Goal inpatient glucose level is 140 to 180   COPD resumed home maintenance inhalers combination - Albuterol 2 puff inhalation every 4 hours as needed for wheezing and shortness of breath resumed - DuoNebs 3 times daily as needed for wheezing and shortness of breath   CKD 3A/2 - Serum creatinine on presentation is 1.31, GFR of 57 - Baseline in the last year serum creatinine was 1.13-1.24, GFR 57-60    Tobacco use disorder nicotine patch as needed ordered - Greater than 3 minutes spent counseling patient on tobacco cessation   Depression/anxiety-resumed paroxetine 40 mg p.o. daily   BPH-Flomax resumed   GERD-PPI   DVT prophylaxis: SQ Lovenox Code Status: Full Family Communication: Attempted to call spouse Domenik Rumpel [phone] on 12/3.  Phone off, voicemail not set up Disposition Plan: Status is:  Inpatient  Remains inpatient appropriate because: Shortness of breath due to symptomatic COVID infection.  Hemodynamically stable.  Still symptomatic.  Anticipate discharge 12/5       Level of care: Telemetry Medical  Consultants:  None  Procedures:   None  Antimicrobials: Remdesivir   Subjective: Patient seen and examined.  Report symptomatic improvement since admission.  No pain complaints  Objective: Vitals:   04/26/21 2114 04/26/21 2347 04/27/21 0529 04/27/21 0827  BP:  (!) 141/67 138/76 (!) 157/65  Pulse:  63 66 68  Resp:  19 19 18   Temp:  97.9 F (36.6 C) 97.9 F (36.6 C) 98.2 F (36.8 C)  TempSrc:  Oral Oral   SpO2: 100% 99% 96% 99%  Weight:      Height:        Intake/Output Summary (Last 24 hours) at 04/27/2021 0957 Last data filed at 04/27/2021 [id number] Gross per 24 hour  Intake --  Output 675 ml  Net -675 ml   Filed Weights   04/24/21 1615  Weight: 83.5 kg    Examination:  General exam: No apparent distress Respiratory system: Coarse breath sounds.  Normal work of breathing.  Room air Cardiovascular system: S1-S2, RRR, no murmurs, no pedal edema Gastrointestinal system: Abdomen is nondistended, soft and nontender. No organomegaly or masses felt. Normal bowel sounds heard. Central nervous system: Alert and oriented. No focal neurological deficits. Extremities: Symmetric 5 x 5 power. Skin: No rashes, lesions or ulcers Psychiatry: Judgement and insight appear normal. Mood & affect appropriate.     Data Reviewed: I have personally reviewed following labs and imaging studies  CBC: Recent Labs  Lab 04/24/21 1613 04/25/21 0604 04/26/21 0623  WBC 9.5 6.7 6.9  NEUTROABS 6.9 6.0 5.9  HGB 8.5* 8.7* 8.3*  HCT 27.4* 27.9* 26.3*  MCV 80.8 79.7* 79.2*  PLT 266 271 [id number]   Basic Metabolic Panel: Recent Labs  Lab 04/24/21 1754 04/25/21 0604 04/26/21 0623  NA 135 138 138  K 3.6 4.1 4.2  CL 105 106 103  CO2 24 24 25   GLUCOSE 101* 209* 235*  BUN 13 18 26*  CREATININE 1.31* 1.20 1.37*  CALCIUM 8.5* 9.1 8.9   GFR: Estimated Creatinine Clearance: 49.6 mL/min (A) (by C-G formula based on SCr of 1.37 mg/dL (H)). Liver Function Tests: Recent Labs  Lab 04/25/21 0604 04/26/21 0623  AST 26 34  ALT 16  19  ALKPHOS 86 72  BILITOT 0.6 0.5  PROT 7.2 6.9  ALBUMIN 3.4* 3.3*   No results for input(s): LIPASE, AMYLASE in the last 168 hours. No results for input(s): AMMONIA in the last 168 hours. Coagulation Profile: No results for input(s): INR, PROTIME in the last 168 hours. Cardiac Enzymes: No results for input(s): CKTOTAL, CKMB, CKMBINDEX, TROPONINI in the last 168 hours. BNP (last 3 results) No results for input(s): PROBNP in the last 8760 hours. HbA1C: No results for input(s): HGBA1C in the last 72 hours. CBG: Recent Labs  Lab 04/26/21 0738 04/26/21 1129 04/26/21 1606 04/26/21 2037 04/27/21 0825  GLUCAP 219* 175* 141* 267* 162*   Lipid Profile: No results for input(s): CHOL, HDL, LDLCALC, TRIG, CHOLHDL, LDLDIRECT in the last 72 hours. Thyroid Function Tests: No results for input(s): TSH, T4TOTAL, FREET4, T3FREE, THYROIDAB in the last 72 hours. Anemia Panel: No results for input(s): VITAMINB12, FOLATE, FERRITIN, TIBC, IRON, RETICCTPCT in the last 72 hours. Sepsis Labs: Recent Labs  Lab 04/24/21 2129  PROCALCITON <0.10    Recent Results (from the past 240 hour(s))  Resp Panel by RT-PCR (Flu A&B, Covid) Nasopharyngeal Swab     Status: Abnormal   Collection Time: 04/24/21  4:36 PM   Specimen: Nasopharyngeal Swab; Nasopharyngeal(NP) swabs in vial transport medium  Result Value Ref Range Status   SARS Coronavirus 2 by RT PCR POSITIVE (A) NEGATIVE Final    Comment: CRITICAL RESULT CALLED TO, READ BACK BY AND VERIFIED WITH: MELLISSA SCOTT 1801 04/24/21 MU  (NOTE) SARS-CoV-2 target nucleic acids are DETECTED.  The SARS-CoV-2 RNA is generally detectable in upper respiratory specimens during the acute phase of infection. Positive results are indicative of the presence of the identified virus, but do not rule out bacterial infection or co-infection with other pathogens not detected by the test. Clinical correlation with patient history and other diagnostic information is  necessary to determine patient infection status. The expected result is Negative.  Fact Sheet for Patients: BloggerCourse.com  Fact Sheet for Healthcare Providers: SeriousBroker.it  This test is not yet approved or cleared by the [place] FDA and  has been authorized for detection and/or diagnosis of SARS-CoV-2 by FDA under an Emergency Use Authorization (EUA).  This EUA will remain in effect (meaning this test  can be used) for the duration of  the COVID-19 declaration under Section 564(b)(1) of the Act, 21 U.S.C. section 360bbb-3(b)(1), unless the authorization is terminated or revoked sooner.     Influenza A by PCR NEGATIVE NEGATIVE Final   Influenza B by PCR NEGATIVE NEGATIVE Final    Comment: (NOTE) The Xpert Xpress SARS-CoV-2/FLU/RSV plus assay is intended as an aid in the diagnosis of influenza from Nasopharyngeal swab specimens and should not be used as a sole basis for treatment. Nasal washings and aspirates are unacceptable for Xpert Xpress SARS-CoV-2/FLU/RSV testing.  Fact Sheet for Patients: BloggerCourse.com  Fact Sheet for Healthcare Providers: SeriousBroker.it  This test is not yet approved or cleared by the [place] FDA and has been authorized for detection and/or diagnosis of SARS-CoV-2 by FDA under an Emergency Use Authorization (EUA). This EUA will remain in effect (meaning this test can be used) for the duration of the COVID-19 declaration under Section 564(b)(1) of the Act, 21 U.S.C. section 360bbb-3(b)(1), unless the authorization is terminated or revoked.  Performed at [facility], [address], [place], [place] [address]          Radiology Studies: No results found.      Scheduled Meds:  amLODipine  5 mg Oral Daily   arformoterol  15 mcg Nebulization BID   And   umeclidinium bromide  1 puff Inhalation Daily    aspirin  81 mg Oral Daily   atorvastatin  40 mg Oral QHS   enoxaparin (LOVENOX) injection  40 mg Subcutaneous QHS   gabapentin  300 mg Oral Q1200   gabapentin  600 mg Oral Daily   gabapentin  900 mg Oral QHS   insulin aspart  0-20 Units Subcutaneous TID WC   insulin aspart  0-5 Units Subcutaneous QHS   insulin glargine-yfgn  25 Units Subcutaneous QHS   Ipratropium-Albuterol  1 puff Inhalation Q4H   losartan  50 mg Oral Daily   methylPREDNISolone (SOLU-MEDROL) injection  1 mg/kg Intravenous Q12H   Followed by   Melene Muller ON 04/28/2021] predniSONE  50 mg Oral Daily   metoprolol tartrate  50 mg Oral BID   pantoprazole  40 mg Oral Daily   PARoxetine  40 mg Oral Daily   polyethylene glycol  17 g Oral Daily   senna-docusate  1  tablet Oral BID   tamsulosin  0.4 mg Oral BID   Continuous Infusions:  remdesivir 100 mg in NS 100 mL 100 mg (04/27/21 0939)     LOS: 2 days    Time spent: 25 minutes    Sidney Ace, MD [facility]   If 7PM-7AM, please contact night-coverage  04/27/2021, 9:57 AM

## 2021-04-27 NOTE — TOC Progression Note (Signed)
Transition of Care [facility]) - Progression Note    Patient Details  Name: [name] Parks MRN: [id number] Date of Birth: November 04, 1945  Transition [facility] CM/SW Contact  Ashley Royalty Lutricia Feil, RN Phone Number:[phone] 04/27/2021, 2:28 PM  Clinical Narrative:    Spoke with both the pt and his spouse (Darla). Both receptive to [facility]. Barbara Cower will get Lynden Ang involved for authorization for services due to  (VA benefits for [employer]). Orders for PT/OT and 3-1 through [employer] who will call the pt concerning the deductible for the 3-1 commode based upon pt's limited VA benefits. Wife indicates pt has all other DME. Wife will be providing transportation when pt is medical steady for discharge which maybe tomorrow and providing transportation to all his medical appointments. Pt lives in a trailer with no steps and able to afford his medications at this time through the [place].  Plans for pt to be discharged tomorrow. TOC will continue to follow up accordingly with any additional needs.    Barriers to Discharge: Barriers Resolved  Expected Discharge Plan and Services                                                 Social Determinants of Health (SDOH) Interventions    Readmission Risk Interventions No flowsheet data found.

## 2021-04-28 DIAGNOSIS — U071 COVID-19: Secondary | ICD-10-CM | POA: Diagnosis not present

## 2021-04-28 DIAGNOSIS — J9601 Acute respiratory failure with hypoxia: Secondary | ICD-10-CM | POA: Diagnosis not present

## 2021-04-28 LAB — GLUCOSE, CAPILLARY
Glucose-Capillary: 53 mg/dL — ABNORMAL LOW (ref 70–99)
Glucose-Capillary: 154 mg/dL — ABNORMAL HIGH (ref 70–99)

## 2021-04-28 MED ORDER — ALBUTEROL SULFATE HFA 108 (90 BASE) MCG/ACT IN AERS: 2.0000 | INHALATION_SPRAY | Freq: Four times a day (QID) | RESPIRATORY_TRACT | 2 refills | Status: AC | PRN

## 2021-04-28 MED ORDER — BENZONATATE 100 MG PO CAPS: 100.0000 mg | ORAL_CAPSULE | Freq: Three times a day (TID) | ORAL | 0 refills | Status: AC | PRN

## 2021-04-28 MED ORDER — INSULIN GLARGINE-YFGN 100 UNIT/ML ~~LOC~~ SOLN
20.0000 [IU] | Freq: Every day | SUBCUTANEOUS | Status: DC
  Filled 2021-04-28: qty 0.2

## 2021-04-28 MED ORDER — METHYLPREDNISOLONE 4 MG PO TBPK: ORAL_TABLET | ORAL | 0 refills | Status: AC

## 2021-04-28 NOTE — Discharge Summary (Signed)
Physician Discharge Summary  [name] Parks [id number] DOB: 04-22-1946 DOA: 04/24/2021  PCP: [name], [place], MD  Admit date: 04/24/2021 Discharge date: 04/28/2021  Admitted From: Home Disposition: Home with home health  Recommendations for Outpatient Follow-up:  Follow up with PCP in 1-2 weeks   Home Health: Yes PT and OT Equipment/Devices: None  Discharge Condition: Stable CODE STATUS: Full Diet recommendation: Regular  Brief/Interim Summary: 75 y.o. male with medical history significant for hypertension, anxiety, depression, COPD, tobacco abuse, insulin-dependent diabetes type 2, who presents emergency department for chief concerns of shortness of breath for 2 days.   At bedside he is able to tell me his name, age, current location of hospital.  He was sleeping when I entered the room.  He was able to participate in the HPI once I woke him up.   He reports that he has had shortness of breath the last 2 to 3 days.  He denies known sick contacts.  He denies any subjective fever and chills, however he states he does not know as he did not check his temperature.  He denies new cough.   Of note, he reports one episode of passing out on 04/23/21. He does not know how long he was out. He denies chest pain, fever, loss of appetite, weight changes, dysuria, dysphagia, hematuria, diarrhea. His last BM was 11/30 and it was normal brown.    He reports the shortness of breath is worsening and worse with exertion. He states he has never felt this short of breath before and that this is different from his prior episode of COPD exacerbation.  Respiratory status improved at the time of admission.  Patient weaned off oxygen and on room air.  Shortness of breath improved.  No wheezing.  Breath sounds improved.  Stable for discharge home.  At time of discharge will recommend Medrol Dosepak, as needed Tessalon Perles, albuterol MDI.  Completed 5-day course of remdesivir in house.    Discharge  Diagnoses:  Principal Problem:   Acute hypoxemic respiratory failure due to COVID-19 [facility]) Active Problems:   Diabetes mellitus (HCC)   Hypertension   Hyperlipidemia   SOB (shortness of breath)   Tobacco abuse   Elevated troponin   COPD (chronic obstructive pulmonary disease) (HCC)   Sepsis (HCC)   Encephalopathy due to COVID-19 virus  Acute hypoxic respiratory failure secondary to COVID-19 COVID-19 positive on PCR with patient endorsement of syncope on 11/30 Patient meets sepsis criteria with the very definition, in setting of increased heart rate, respiration rate, organ involvement of cardiac and pulmonary, source of COVID-19 pneumonia Plan: Completed 5-day course of remdesivir in house.  No antibiotics or antivirals indicated on discharge.  On room air at time of discharge.  Will discharge home with home health services.  Continue steroids via Medrol Dosepak.  As needed albuterol MDI prescribed.  Therapy evaluations recommend home with home health.  TOC consulted Home health orders placed at time of discharge   Syncopal event with sinus tachycardia CTA negative, PE ruled out Continue therapy evaluations Recommend home with home health,  [facility] consult  Home health orders placed   Elevated troponin No significant delta Low suspicion for ACS Pulmonary embolism ruled out Suspect supply demand ischemia  Hypertension PTA amlodipine 5 mg daily, metoprolol titrate 50 mg twice daily    Insulin-dependent diabetes mellitus - Can resume home regimen at time of discharge   COPD Can resume home regimen at time of discharge   CKD 3A/2 Creatinine baseline.  Outpatient follow-up  Tobacco use disorder nicotine patch as needed ordered - Greater than 3 minutes spent counseling patient on tobacco cessation  Discharge Instructions  Discharge Instructions     Diet - low sodium heart healthy   Complete by: As directed    Increase activity slowly   Complete by: As directed        Allergies as of 04/28/2021       Reactions   Bupropion Other (See Comments)   Paroxetine Other (See Comments)   Paxil [paroxetine Hcl]    Pt reports allergy from [place] paperwork (pt is prescribed this med)   Terazosin Other (See Comments)   Trazodone And Nefazodone         Medication List     TAKE these medications    acetaminophen 325 MG tablet Commonly known as: TYLENOL Take 650 mg by mouth 3 (three) times daily as needed for mild pain.   albuterol 108 (90 Base) MCG/ACT inhaler Commonly known as: VENTOLIN HFA Inhale 2 puffs into the lungs every 4 (four) hours as needed for wheezing or shortness of breath. What changed: Another medication with the same name was added. Make sure you understand how and when to take each.   albuterol 108 (90 Base) MCG/ACT inhaler Commonly known as: VENTOLIN HFA Inhale 2 puffs into the lungs every 6 (six) hours as needed for wheezing or shortness of breath. What changed: You were already taking a medication with the same name, and this prescription was added. Make sure you understand how and when to take each.   alfuzosin 10 MG 24 hr tablet Commonly known as: UROXATRAL Take 10 mg by mouth daily with breakfast.   amLODipine 5 MG tablet Commonly known as: NORVASC Take 1 tablet (5 mg total) by mouth daily. What changed: how much to take   aspirin 81 MG chewable tablet Chew 81 mg by mouth daily.   atorvastatin 80 MG tablet Commonly known as: LIPITOR Take 40 mg by mouth at bedtime.   benzonatate 100 MG capsule Commonly known as: Tessalon Perles Take 1 capsule (100 mg total) by mouth 3 (three) times daily as needed for cough.   cholecalciferol 1000 units tablet Commonly known as: VITAMIN D Take 1,000 Units by mouth daily.   fluticasone 50 MCG/ACT nasal spray Commonly known as: FLONASE Place 2 sprays into both nostrils daily as needed for allergies.   gabapentin 300 MG capsule Commonly known as: NEURONTIN Take 300-900 mg by mouth  See admin instructions. Take 2 capsules (600mg ) by mouth every morning, take 1 capsule (300mg ) by mouth at midday and take 3 capsules (900mg ) by mouth at bedtime   insulin aspart 100 UNIT/ML injection Commonly known as: novoLOG Inject 6 Units into the skin 3 (three) times daily with meals. What changed: how much to take   insulin glargine 100 UNIT/ML injection Commonly known as: LANTUS Inject 0.25 mLs (25 Units total) into the skin at bedtime.   loratadine 10 MG tablet Commonly known as: CLARITIN Take 10 mg by mouth daily.   losartan 50 MG tablet Commonly known as: COZAAR Take 50 mg by mouth daily.   metFORMIN 500 MG tablet Commonly known as: GLUCOPHAGE Take 500 mg by mouth 3 (three) times daily with meals.   methylPREDNISolone 4 MG Tbpk tablet Commonly known as: MEDROL DOSEPAK Medrol dosepak.  Take as per package directions   metoprolol tartrate 50 MG tablet Commonly known as: LOPRESSOR Take 1 tablet (50 mg total) by mouth 2 (two) times daily.   omeprazole 20  MG capsule Commonly known as: PRILOSEC Take 20 mg by mouth 2 (two) times daily.   PARoxetine 40 MG tablet Commonly known as: PAXIL Take 40 mg by mouth daily.   potassium chloride SA 20 MEQ tablet Commonly known as: KLOR-CON M Take 20 mEq by mouth daily.   senna-docusate 8.6-50 MG tablet Commonly known as: Senokot-S Take 2 tablets by mouth at bedtime.   Tiotropium Bromide-Olodaterol 2.5-2.5 MCG/ACT Aers Inhale 2 puffs into the lungs daily.        Allergies  Allergen Reactions   Bupropion Other (See Comments)   Paroxetine Other (See Comments)   Paxil [Paroxetine Hcl]     Pt reports allergy from [place] paperwork (pt is prescribed this med)   Terazosin Other (See Comments)   Trazodone And Nefazodone     Consultations: None   Procedures/Studies: DG Chest 2 View  Result Date: 04/24/2021 CLINICAL DATA:  Short of breath EXAM: CHEST - 2 VIEW COMPARISON:  04/24/2020 FINDINGS: COPD. Emphysema. Reticular  markings in the bases most consistent with chronic scarring. Negative for acute infiltrate or effusion. Atherosclerotic calcification aortic arch. IMPRESSION: COPD with scarring in the bases.  No superimposed acute abnormality. Electronically Signed   By: Marlan Palau M.D.   On: 04/24/2021 16:56   CT Angio Chest Pulmonary Embolism (PE) W or WO Contrast  Result Date: 04/24/2021 CLINICAL DATA:  Shortness of breath. EXAM: CT ANGIOGRAPHY CHEST WITH CONTRAST TECHNIQUE: Multidetector CT imaging of the chest was performed using the standard protocol during bolus administration of intravenous contrast. Multiplanar CT image reconstructions and MIPs were obtained to evaluate the vascular anatomy. CONTRAST:  32mL OMNIPAQUE IOHEXOL 350 MG/ML SOLN COMPARISON:  February 24, 2018 FINDINGS: Cardiovascular: There is marked severity calcification of the thoracic aorta, without evidence of aneurysmal dilatation or dissection. Satisfactory opacification of the pulmonary arteries to the segmental level. No evidence of pulmonary embolism. Normal heart size with marked severity coronary artery calcification. No pericardial effusion. Mediastinum/Nodes: Mild AP window, paratracheal and bilateral hilar lymphadenopathy is seen. Thyroid gland, trachea, and esophagus demonstrate no significant findings. Lungs/Pleura: There is marked severity emphysematous lung disease. A stable 4 mm peripheral, noncalcified right upper lobe lung nodule is seen (axial CT image 41, CT series 6). There is no evidence of acute infiltrate, pleural effusion or pneumothorax. Upper Abdomen: A stable 1.6 cm x 1.1 cm focus of parenchymal low attenuation is seen within the left lobe of the liver. A stable 3.0 cm x 2.5 cm low-attenuation left adrenal mass is seen (approximately 14.08 Hounsfield units). Musculoskeletal: Multilevel degenerative changes are seen throughout the thoracic spine. Review of the MIP images confirms the above findings. IMPRESSION: 1. No evidence  of pulmonary embolism. 2. Marked severity emphysematous lung disease. 3. Stable 4 mm peripheral, noncalcified right upper lobe lung nodule, likely benign. 4. 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 5. Stable focus of parenchymal low attenuation within the left lobe of the liver, which may represent a small cyst or hemangioma. Aortic Atherosclerosis (ICD10-I70.0) and Emphysema (ICD10-J43.9). Electronically Signed   By: Aram Candela M.D.   On: 04/24/2021 23:01      Subjective: Seen and examined at the time of discharge.  Respiratory status improved.  Stable for discharge home with home health services.  Discharge Exam: Vitals:   04/28/21 0751 04/28/21 1155  BP: (!) 141/78 136/69  Pulse: (!) 59 (!) 57  Resp: 16 16  Temp: 97.8 F (36.6 C) 97.8 F (36.6 C)  SpO2: 99% 100%  Vitals:   04/27/21 2112 04/28/21 0622 04/28/21 0751 04/28/21 1155  BP: (!) 143/71 130/65 (!) 141/78 136/69  Pulse: 66 60 (!) 59 (!) 57  Resp: 18 16 16 16   Temp: 97.9 F (36.6 C) 97.8 F (36.6 C) 97.8 F (36.6 C) 97.8 F (36.6 C)  TempSrc: Oral Oral Oral Oral  SpO2: 98% 99% 99% 100%  Weight:      Height:        General: Pt is alert, awake, not in acute distress Cardiovascular: RRR, S1/S2 +, no rubs, no gallops Respiratory: Lungs clear.  Decreased breath sounds.  No wheeze.  No rhonchi.  Room air Abdominal: Soft, NT, ND, bowel sounds + Extremities: no edema, no cyanosis    The results of significant diagnostics from this hospitalization (including imaging, microbiology, ancillary and laboratory) are listed below for reference.     Microbiology: Recent Results (from the past 240 hour(s))  Resp Panel by RT-PCR (Flu A&B, Covid) Nasopharyngeal Swab     Status: Abnormal   Collection Time: 04/24/21  4:36 PM   Specimen: Nasopharyngeal Swab; Nasopharyngeal(NP) swabs in vial transport medium  Result Value Ref Range Status   SARS Coronavirus 2 by RT PCR POSITIVE  (A) NEGATIVE Final    Comment: CRITICAL RESULT CALLED TO, READ BACK BY AND VERIFIED WITH: MELLISSA SCOTT 1801 04/24/21 MU  (NOTE) SARS-CoV-2 target nucleic acids are DETECTED.  The SARS-CoV-2 RNA is generally detectable in upper respiratory specimens during the acute phase of infection. Positive results are indicative of the presence of the identified virus, but do not rule out bacterial infection or co-infection with other pathogens not detected by the test. Clinical correlation with patient history and other diagnostic information is necessary to determine patient infection status. The expected result is Negative.  Fact Sheet for Patients: EntrepreneurPulse.com.au  Fact Sheet for Healthcare Providers: IncredibleEmployment.be  This test is not yet approved or cleared by the [place] FDA and  has been authorized for detection and/or diagnosis of SARS-CoV-2 by FDA under an Emergency Use Authorization (EUA).  This EUA will remain in effect (meaning this test  can be used) for the duration of  the COVID-19 declaration under Section 564(b)(1) of the Act, 21 U.S.C. section 360bbb-3(b)(1), unless the authorization is terminated or revoked sooner.     Influenza A by PCR NEGATIVE NEGATIVE Final   Influenza B by PCR NEGATIVE NEGATIVE Final    Comment: (NOTE) The Xpert Xpress SARS-CoV-2/FLU/RSV plus assay is intended as an aid in the diagnosis of influenza from Nasopharyngeal swab specimens and should not be used as a sole basis for treatment. Nasal washings and aspirates are unacceptable for Xpert Xpress SARS-CoV-2/FLU/RSV testing.  Fact Sheet for Patients: EntrepreneurPulse.com.au  Fact Sheet for Healthcare Providers: IncredibleEmployment.be  This test is not yet approved or cleared by the [place] FDA and has been authorized for detection and/or diagnosis of SARS-CoV-2 by FDA under an Emergency Use  Authorization (EUA). This EUA will remain in effect (meaning this test can be used) for the duration of the COVID-19 declaration under Section 564(b)(1) of the Act, 21 U.S.C. section 360bbb-3(b)(1), unless the authorization is terminated or revoked.  Performed at [facility], [address] [place], [place] [address]      Labs: BNP (last 3 results) Recent Labs    04/24/21 1613  BNP 99991111   Basic Metabolic Panel: Recent Labs  Lab 04/24/21 1754 04/25/21 0604 04/26/21 0623  NA 135 138 138  K 3.6 4.1 4.2  CL 105  106 103  CO2 24 24 25   GLUCOSE 101* 209* 235*  BUN 13 18 26*  CREATININE 1.31* 1.20 1.37*  CALCIUM 8.5* 9.1 8.9   Liver Function Tests: Recent Labs  Lab 04/25/21 0604 04/26/21 0623  AST 26 34  ALT 16 19  ALKPHOS 86 72  BILITOT 0.6 0.5  PROT 7.2 6.9  ALBUMIN 3.4* 3.3*   No results for input(s): LIPASE, AMYLASE in the last 168 hours. No results for input(s): AMMONIA in the last 168 hours. CBC: Recent Labs  Lab 04/24/21 1613 04/25/21 0604 04/26/21 0623  WBC 9.5 6.7 6.9  NEUTROABS 6.9 6.0 5.9  HGB 8.5* 8.7* 8.3*  HCT 27.4* 27.9* 26.3*  MCV 80.8 79.7* 79.2*  PLT 266 271 306   Cardiac Enzymes: No results for input(s): CKTOTAL, CKMB, CKMBINDEX, TROPONINI in the last 168 hours. BNP: Invalid input(s): POCBNP CBG: Recent Labs  Lab 04/27/21 1143 04/27/21 1630 04/27/21 2110 04/28/21 0752 04/28/21 1157  GLUCAP 190* 166* 253* 53* 154*   D-Dimer No results for input(s): DDIMER in the last 72 hours. Hgb A1c No results for input(s): HGBA1C in the last 72 hours. Lipid Profile No results for input(s): CHOL, HDL, LDLCALC, TRIG, CHOLHDL, LDLDIRECT in the last 72 hours. Thyroid function studies No results for input(s): TSH, T4TOTAL, T3FREE, THYROIDAB in the last 72 hours.  Invalid input(s): FREET3 Anemia work up No results for input(s): VITAMINB12, FOLATE, FERRITIN, TIBC, IRON, RETICCTPCT in the last 72 hours. Urinalysis    Component  Value Date/Time   COLORURINE STRAW (A) 04/24/2020 0342   APPEARANCEUR CLEAR 04/24/2020 0342   LABSPEC 1.004 (L) 04/24/2020 0342   PHURINE 6.0 04/24/2020 0342   GLUCOSEU NEGATIVE 04/24/2020 0342   HGBUR LARGE (A) 04/24/2020 0342   BILIRUBINUR NEGATIVE 04/24/2020 0342   KETONESUR NEGATIVE 04/24/2020 0342   PROTEINUR 30 (A) 04/24/2020 0342   UROBILINOGEN 0.2 08/28/2014 1715   NITRITE NEGATIVE 04/24/2020 0342   LEUKOCYTESUR MODERATE (A) 04/24/2020 0342   Sepsis Labs Invalid input(s): PROCALCITONIN,  WBC,  LACTICIDVEN Microbiology Recent Results (from the past 240 hour(s))  Resp Panel by RT-PCR (Flu A&B, Covid) Nasopharyngeal Swab     Status: Abnormal   Collection Time: 04/24/21  4:36 PM   Specimen: Nasopharyngeal Swab; Nasopharyngeal(NP) swabs in vial transport medium  Result Value Ref Range Status   SARS Coronavirus 2 by RT PCR POSITIVE (A) NEGATIVE Final    Comment: CRITICAL RESULT CALLED TO, READ BACK BY AND VERIFIED WITH: MELLISSA SCOTT 1801 04/24/21 MU  (NOTE) SARS-CoV-2 target nucleic acids are DETECTED.  The SARS-CoV-2 RNA is generally detectable in upper respiratory specimens during the acute phase of infection. Positive results are indicative of the presence of the identified virus, but do not rule out bacterial infection or co-infection with other pathogens not detected by the test. Clinical correlation with patient history and other diagnostic information is necessary to determine patient infection status. The expected result is Negative.  Fact Sheet for Patients: EntrepreneurPulse.com.au  Fact Sheet for Healthcare Providers: IncredibleEmployment.be  This test is not yet approved or cleared by the [place] FDA and  has been authorized for detection and/or diagnosis of SARS-CoV-2 by FDA under an Emergency Use Authorization (EUA).  This EUA will remain in effect (meaning this test  can be used) for the duration of  the COVID-19  declaration under Section 564(b)(1) of the Act, 21 U.S.C. section 360bbb-3(b)(1), unless the authorization is terminated or revoked sooner.     Influenza A by PCR NEGATIVE NEGATIVE Final  Influenza B by PCR NEGATIVE NEGATIVE Final    Comment: (NOTE) The Xpert Xpress SARS-CoV-2/FLU/RSV plus assay is intended as an aid in the diagnosis of influenza from Nasopharyngeal swab specimens and should not be used as a sole basis for treatment. Nasal washings and aspirates are unacceptable for Xpert Xpress SARS-CoV-2/FLU/RSV testing.  Fact Sheet for Patients: EntrepreneurPulse.com.au  Fact Sheet for Healthcare Providers: IncredibleEmployment.be  This test is not yet approved or cleared by the [place] FDA and has been authorized for detection and/or diagnosis of SARS-CoV-2 by FDA under an Emergency Use Authorization (EUA). This EUA will remain in effect (meaning this test can be used) for the duration of the COVID-19 declaration under Section 564(b)(1) of the Act, 21 U.S.C. section 360bbb-3(b)(1), unless the authorization is terminated or revoked.  Performed at [facility], [address], [place], [place] [address]      Time coordinating discharge: Over 30 minutes  SIGNED:   Sidney Ace, MD  [facility] 04/28/2021, 1:55 PM Pager   If 7PM-7AM, please contact night-coverage

## 2021-04-28 NOTE — Progress Notes (Signed)
Inpatient Diabetes Program Recommendations  AACE/ADA: New Consensus Statement on Inpatient Glycemic Control   Target Ranges:  Prepandial:   less than 140 mg/dL      Peak postprandial:   less than 180 mg/dL (1-2 hours)      Critically ill patients:  140 - 180 mg/dL    Latest Reference Range & Units 04/27/21 08:25 04/27/21 11:43 04/27/21 16:30 04/27/21 21:10 04/28/21 07:52  Glucose-Capillary 70 - 99 mg/dL 591 (H) 638 (H) 466 (H) 253 (H) 53 (L)   Review of Glycemic Control  Diabetes history: DM2 Outpatient Diabetes medications: Lantus 25 units daily, Novolog 4 units TID with meals, Metformin 500 mg TID Current orders for Inpatient glycemic control: Semglee 25 units QHS, Novolog 0-20 units TID with meals, Novolog 0-5 units QHS; Prednisone 50 mg daily  Inpatient Diabetes Program Recommendations:    Insulin: Noted Solumedrol was changed to Prednisone. Fasting glucose 53 mg/dl today. Please consider decreasing Semglee to 20 units QHS.  Thanks, Orlando Penner, RN, MSN, CDE Diabetes Coordinator Inpatient Diabetes Program [phone] (Team Pager from 8am to 5pm)

## 2021-04-28 NOTE — Progress Notes (Signed)
Occupational Therapy Treatment Patient Details Name: [name] Parks MRN: [id number] DOB: January 01, 1946 Today's Date: 04/28/2021   History of present illness 75 y.o. male with medical history significant for hypertension, anxiety, depression, COPD, tobacco abuse, insulin-dependent diabetes type 2, who presents emergency department for chief concerns of shortness of breath for 2 days. Pt tested positive with COVID-19.   OT comments  Pt awake in bed upon OT arrival and agreeable to OT session.  Pt completed LB dressing EOB with good balance.  Pt requested cane vs RW to complete fxl mobility to bathroom, but wall walked or furniture walked with cane and min guard from OT with 1 LOB, min A to recover.  Pt reported feeling "drunk," noting his decreased balance and was agreeable to recommendation to use RW next time.  Completed toilet transfer with good control using cane and grab bar, requiring seated rest break x30 seconds with PLB before return to chair.  In chair, pt completed seated red theraband exercises for BUEs with frequent rest breaks during and between sets.  02 sats monitored throughout session remaining 92-95% on room air.  3in1 had been delivered to room.  OT instructed in use over toilet, at bedside, or as shower chair, and instructed in how to adjust height with pt verbalizing understanding of all education.  Left pt sitting up in chair with call light and phone near, all needed supplies within reach.  Reinforced to use call light when ready to return to bed d/t unsteadiness.  Pt agreed.    Recommendations for follow up therapy are one component of a multi-disciplinary discharge planning process, led by the attending physician.  Recommendations may be updated based on patient status, additional functional criteria and insurance authorization.    Follow Up Recommendations  Home health OT    Assistance Recommended at Discharge Intermittent Supervision/Assistance  Equipment Recommendations   BSC/3in1;Tub/shower seat          Precautions / Restrictions Precautions Precautions: Fall Precaution Comments: Pt unsteady, reports he feels "drunk" from not being up much. Restrictions Weight Bearing Restrictions: No       Mobility Bed Mobility Overal bed mobility: Modified Independent             General bed mobility comments: supine<>sit Patient Response: Cooperative  Transfers Overall transfer level: Needs assistance Equipment used: Straight cane Transfers: Sit to/from Stand Sit to Stand: Supervision           General transfer comment: close supv with cane in R hand, LUE support on bed rail for sit to stand from bed, and 1 hand on grab bar for toilet transfer     Balance Overall balance assessment: Needs assistance Sitting-balance support: Feet supported;No upper extremity supported Sitting balance-Leahy Scale: Normal     Standing balance support: Bilateral upper extremity supported Standing balance-Leahy Scale: Poor Standing balance comment: furniture or wall walking with use of cane throughout fxl mobility, and min guard from OT as pt requested to use his cane.  Encouraged use of RW next mobility attempt with pt in agreement.                           ADL either performed or assessed with clinical judgement   ADL Overall ADL's : Needs assistance/impaired                     Lower Body Dressing: Set up;Sitting/lateral leans;Bed level Lower Body Dressing Details (indicate cue type and reason): donned  socks and slippers sitting EOB; able to cross R leg over L to reach R foot.  Able to reach to L foot on floor to adjust sock. Toilet Transfer: Supervision/safety;Grab Paramedic Details (indicate cue type and reason): cane in L hand for support; slow turn to back up to commode, slow descent with good control using grab bar         Functional mobility during ADLs: Min guard;Cane General ADL Comments: Pt  requested to use cane vs walker for fxl mobility in room, but did have 1 LOB, recovered with min A from OT.  Pt reported feeling "drunk" d/t unsteadiness and not being up much lately, performed furniture walking to reach bathroom from bedroom and min guard from OT.  OT encouraged RW instead of cane when pt was ready to get up again; pt in agreement.    Extremity/Trunk Assessment Upper Extremity Assessment Upper Extremity Assessment: Overall WFL for tasks assessed   Lower Extremity Assessment Lower Extremity Assessment: Defer to PT evaluation        Vision Patient Visual Report: No change from baseline                Cognition Arousal/Alertness: Awake/alert Behavior During Therapy: WFL for tasks assessed/performed Overall Cognitive Status: Within Functional Limits for tasks assessed                                            Exercises Other Exercises Other Exercises: Red theraband issued with handout.  OT instructed pt in bilat shoulder horiz abd, shoulder elevation, and bicep/tricep strengthening x 1 set 10 reps each.  Pt required extra time, frequent rest breaks.  Reported shoulder elevation to be challended and rested before reaching 10 reps.  OT cued for pursed lip breathing, and slow steady pace, rest as needed.                Pertinent Vitals/ Pain       Pain Assessment: No/denies pain                                                          Frequency  Min 2X/week        Progress Toward Goals  OT Goals(current goals can now be found in the care plan section)  Progress towards OT goals: Progressing toward goals  Acute Rehab OT Goals Patient Stated Goal: To get better and go home OT Goal Formulation: With patient Time For Goal Achievement: 05/10/21 Potential to Achieve Goals: Good  Plan Discharge plan remains appropriate    Co-evaluation                AM-PAC OT "6 Clicks" Daily Activity     Outcome  Measure   Help from another person eating meals?: None Help from another person taking care of personal grooming?: A Little Help from another person toileting, which includes using toliet, bedpan, or urinal?: A Little Help from another person bathing (including washing, rinsing, drying)?: A Little Help from another person to put on and taking off regular upper body clothing?: None Help from another person to put on and taking off regular lower body clothing?: A Little 6 Click Score: 20  End of Session Equipment Utilized During Treatment: Other (comment) (single point cane; though RW is recommended (pt declined walker this morning))  OT Visit Diagnosis: Unsteadiness on feet (R26.81);Muscle weakness (generalized) (M62.81)   Activity Tolerance Patient limited by fatigue   Patient Left with call bell/phone within reach;in chair             Time: 9242-6834 OT Time Calculation (min): 40 min  Charges: OT General Charges $OT Visit: 1 Visit OT Treatments $Self Care/Home Management : 23-37 mins $Therapeutic Exercise: 8-22 mins  [name] Earthly, MS, OTR/L   [name] Parks 04/28/2021, 11:13 AM

## 2021-04-28 NOTE — TOC Progression Note (Addendum)
Transition of Care [facility]) - Progression Note    Patient Details  Name: [name] Parks MRN: [id number] Date of Birth: 11-19-1945  Transition of Care [facility] CM/SW Contact  Hetty Ely, RN Phone Number: 04/28/2021, 2:09 PM  Clinical Narrative: Spoke with [facility], unable to approve [facility] without [facility] Authorization, representative covering is not in and Barbara Cower will consult with covering personnel to get authorization which may take a few days. Attending notified.  [facility], Barbara Cower did speak with Lillia Mountain [phone], who will get [facility] Authorization for patient.      Barriers to Discharge: Barriers Resolved  Expected Discharge Plan and Services           Expected Discharge Date: 04/28/21                                     Social Determinants of Health (SDOH) Interventions    Readmission Risk Interventions No flowsheet data found.

## 2021-04-28 NOTE — Care Management Important Message (Signed)
Important Message  Patient Details  Name: [name] Parks MRN: [id number] Date of Birth: May 04, 1946   Medicare Important Message Given:  Yes  Patient is in an isolation room so I called his room [phone]) and reviewed the Important Message from Medicare with his wife and she was in the room with him and she asked him if he was in agreement with his discharge today and he said he was. I asked if they would like me to send a copy of the form but they declined. I wished him a speedy recovery and thanked her for her time.   Olegario Messier A Paige Vanderwoude 04/28/2021, 2:55 PM

## 2021-05-16 ENCOUNTER — Ambulatory Visit: Payer: No Typology Code available for payment source | Admitting: Podiatry

## 2021-06-25 DEATH — deceased

## 2022-06-05 IMAGING — CT CT ANGIO CHEST
2 of 7 series · 18 of 46 positions shown · IV contrast (omnipaque)
Comparison: February 24, 2018

CLINICAL DATA: Shortness of breath.

EXAM:
CT ANGIOGRAPHY CHEST WITH CONTRAST
TECHNIQUE: Multidetector CT imaging of the chest was performed using the
standard protocol during bolus administration of intravenous
contrast. Multiplanar CT image reconstructions and MIPs were
obtained to evaluate the vascular anatomy.
CONTRAST:  80mL OMNIPAQUE IOHEXOL 350 MG/ML SOLN

[Series 5: thins · axial · 0.85mm/px · z∈[-237,+51]mm · 15 of 402 slices shown]
[im 21/402  lung]
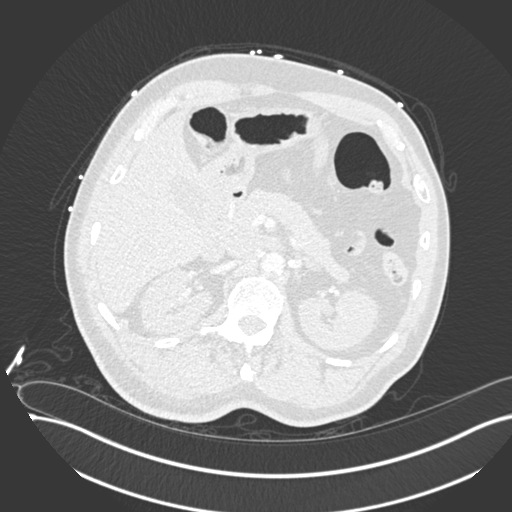
[im 41/402  soft-tissue]
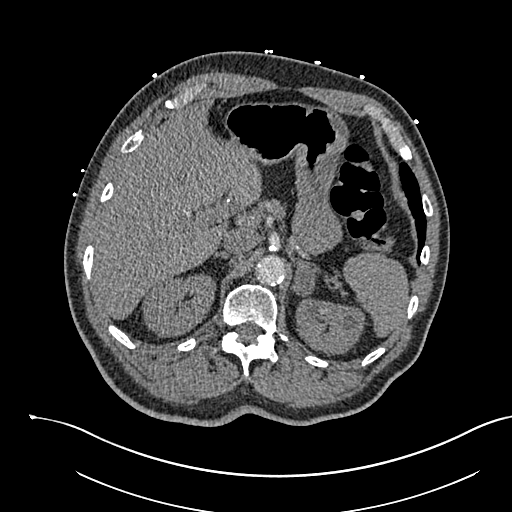
[im 81/402  lung]
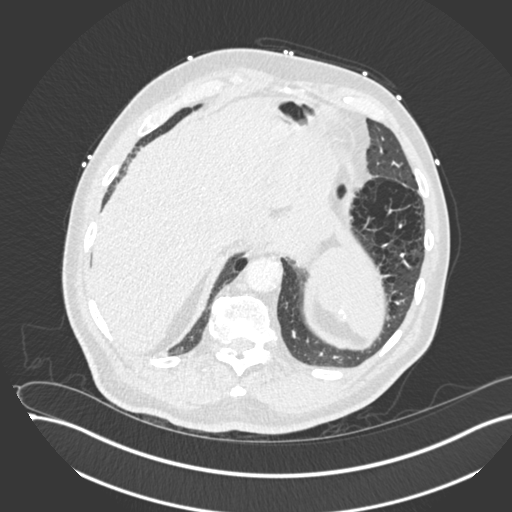
[im 101/402  soft-tissue]
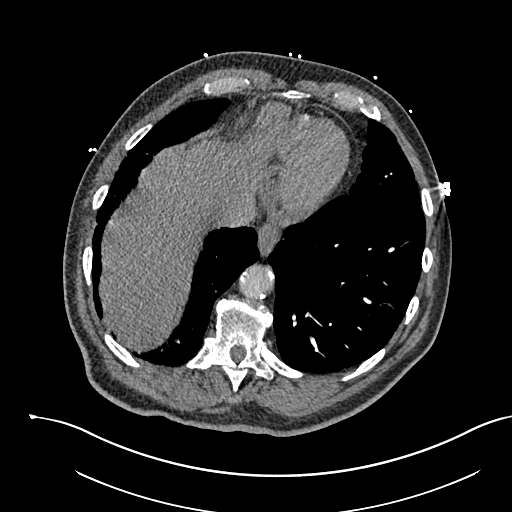
[im 121/402  lung]
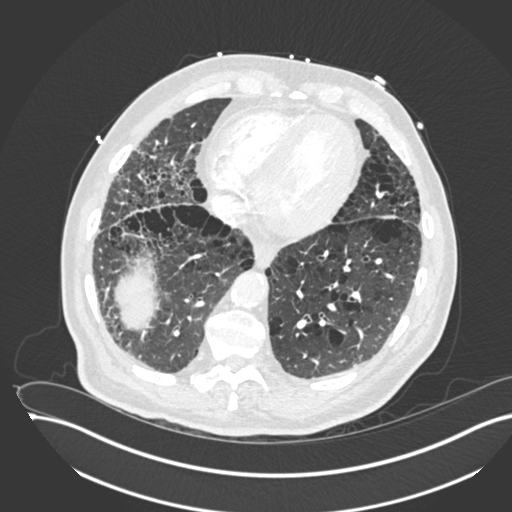
[im 141/402  soft-tissue]
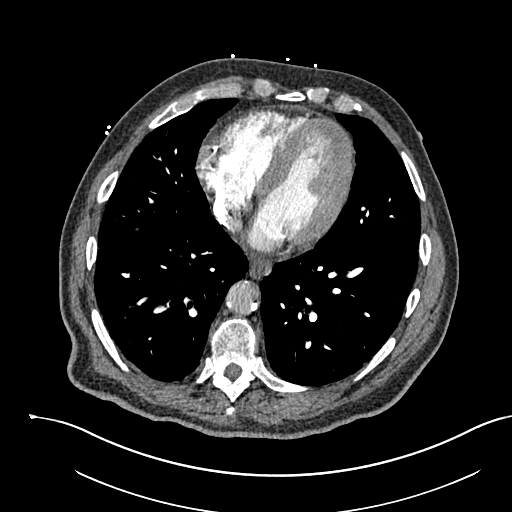
[im 181/402  lung]
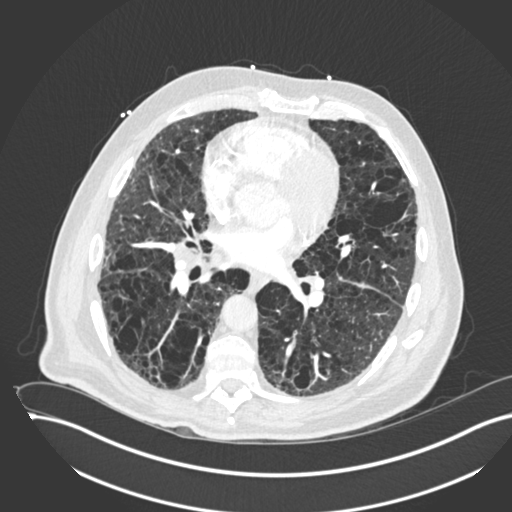
[im 201/402  soft-tissue]
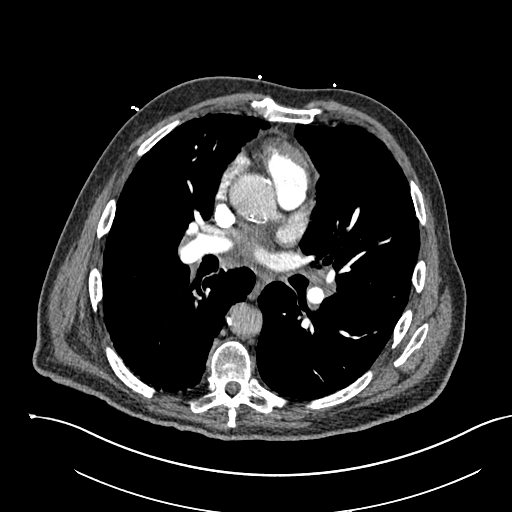
[im 221/402  lung]
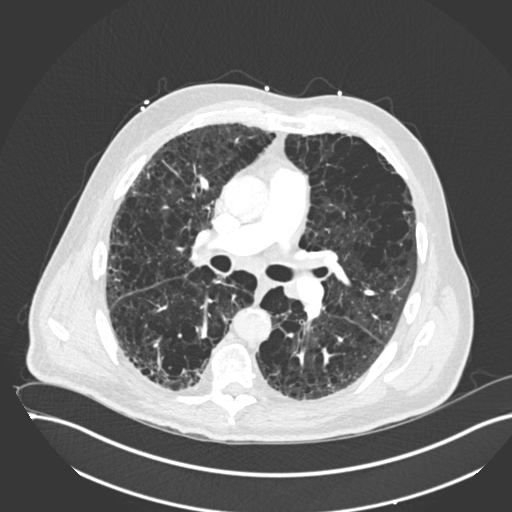
[im 261/402  soft-tissue]
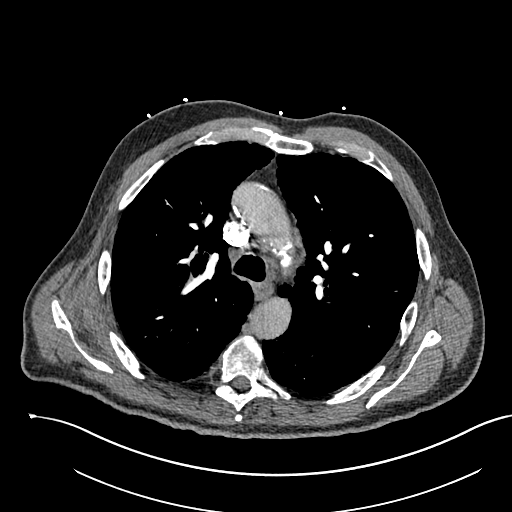
[im 281/402  lung]
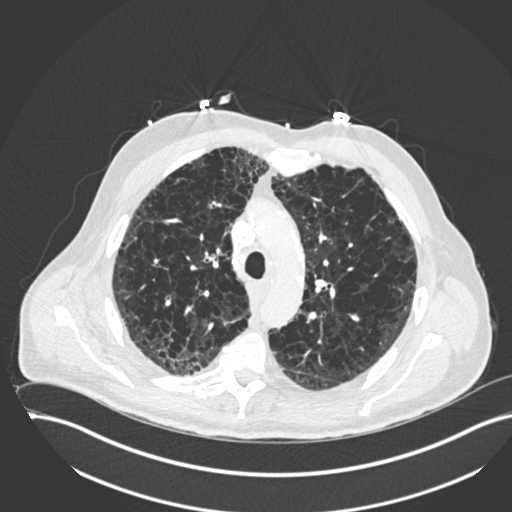
[im 301/402  soft-tissue]
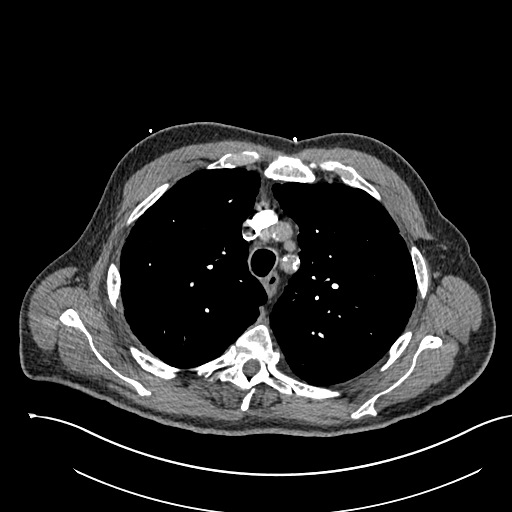
[im 321/402  lung]
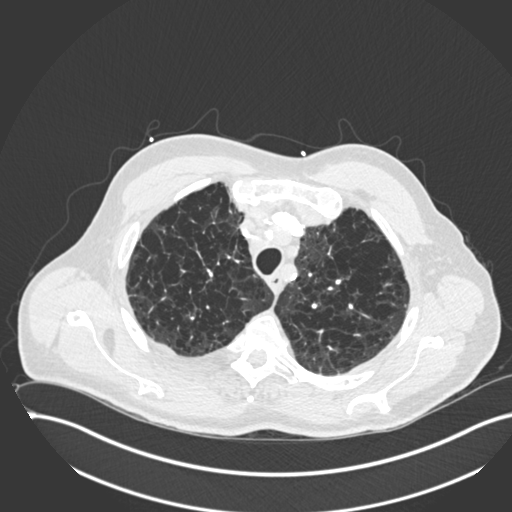
[im 361/402  soft-tissue]
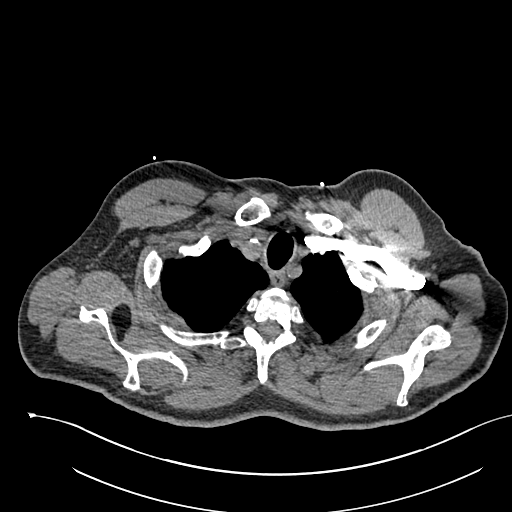
[im 381/402  lung]
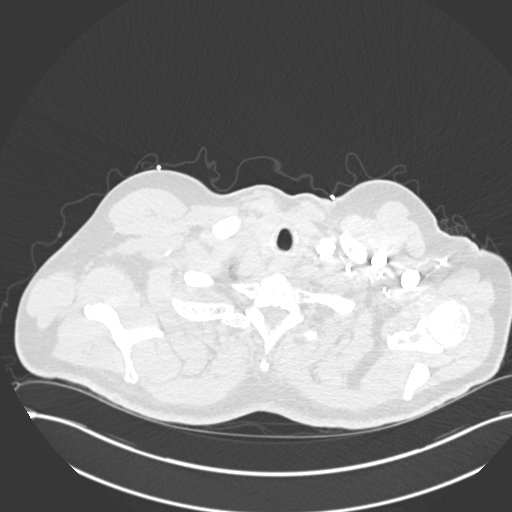

[Series 7: coronal mpr · coronal · 0.63mm/px · 3 of 114 slices shown]
[im 29/114  soft-tissue]
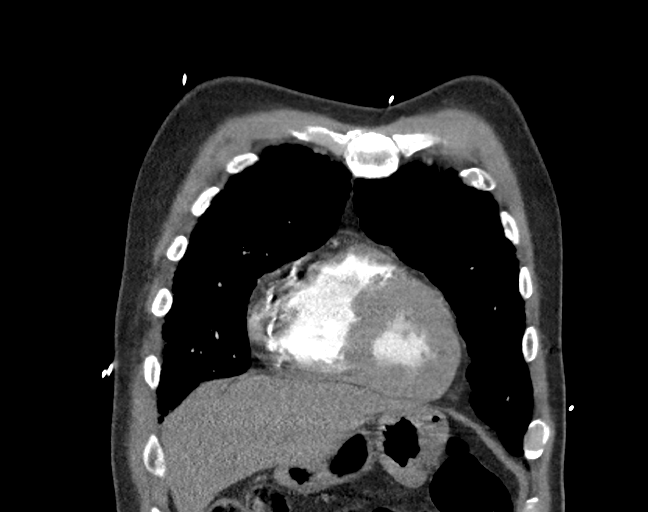
[im 57/114  soft-tissue]
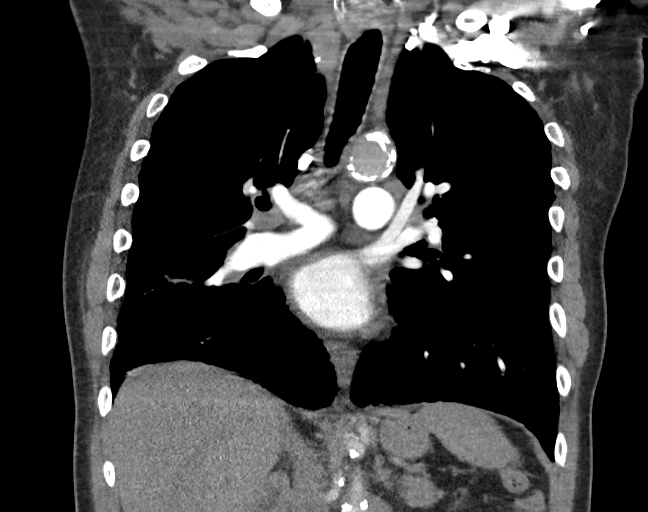
[im 85/114  soft-tissue]
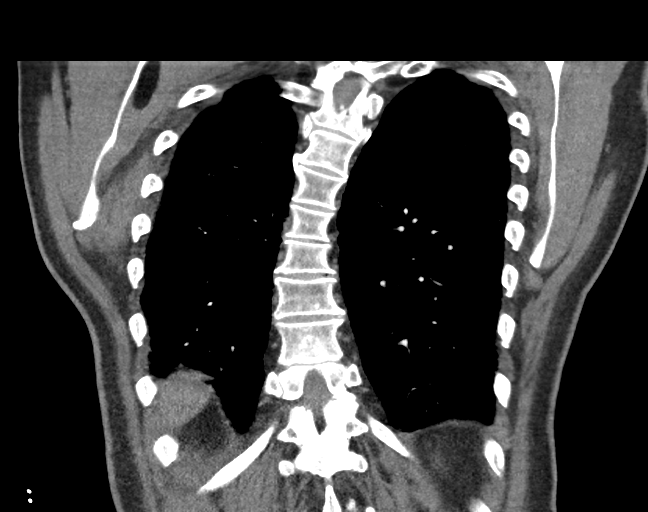

[18 of 46 positions shown; findings below may reference images not displayed]

FINDINGS: Cardiovascular: There is marked severity calcification of the
thoracic aorta, without evidence of aneurysmal dilatation or
dissection. Satisfactory opacification of the pulmonary arteries to
the segmental level. No evidence of pulmonary embolism. Normal heart
size with marked severity coronary artery calcification. No
pericardial effusion.

Mediastinum/Nodes: Mild AP window, paratracheal and bilateral hilar
lymphadenopathy is seen. Thyroid gland, trachea, and esophagus
demonstrate no significant findings.

Lungs/Pleura: There is marked severity emphysematous lung disease.

A stable 4 mm peripheral, noncalcified right upper lobe lung nodule
is seen (axial CT image 41, CT series 6).

There is no evidence of acute infiltrate, pleural effusion or
pneumothorax.

Upper Abdomen: A stable 1.6 cm x 1.1 cm focus of parenchymal low
attenuation is seen within the left lobe of the liver.

A stable 3.0 cm x 2.5 cm low-attenuation left adrenal mass is seen
(approximately 14.08 Hounsfield units).

Musculoskeletal: Multilevel degenerative changes are seen throughout
the thoracic spine.

Review of the MIP images confirms the above findings.
IMPRESSION: 1. No evidence of pulmonary embolism.
2. Marked severity emphysematous lung disease.
3. Stable 4 mm peripheral, noncalcified right upper lobe lung
nodule, likely benign.
4. Stable low-attenuation left adrenal mass, likely consistent with
an adrenal adenoma.
5. Stable focus of parenchymal low attenuation within the left lobe
of the liver, which may represent a small cyst or hemangioma.

Aortic Atherosclerosis ([id number]-[id number].Q) and Emphysema ([id number].O).
